# Patient Record
Sex: Male | Born: 1965 | Race: Black or African American | Hispanic: No | Marital: Single | Smoking: Former smoker
Health system: Southern US, Community
[De-identification: ages and names within clinical notes are randomized; demographics above are authoritative.]

## PROBLEM LIST (undated history)

## (undated) DIAGNOSIS — I10 Essential (primary) hypertension: Secondary | ICD-10-CM

## (undated) DIAGNOSIS — E119 Type 2 diabetes mellitus without complications: Secondary | ICD-10-CM

## (undated) DIAGNOSIS — N209 Urinary calculus, unspecified: Secondary | ICD-10-CM

## (undated) DIAGNOSIS — K219 Gastro-esophageal reflux disease without esophagitis: Secondary | ICD-10-CM

## (undated) DIAGNOSIS — M129 Arthropathy, unspecified: Secondary | ICD-10-CM

## (undated) DIAGNOSIS — L4050 Arthropathic psoriasis, unspecified: Secondary | ICD-10-CM

## (undated) DIAGNOSIS — M25511 Pain in right shoulder: Secondary | ICD-10-CM

## (undated) DIAGNOSIS — I2510 Atherosclerotic heart disease of native coronary artery without angina pectoris: Secondary | ICD-10-CM

## (undated) DIAGNOSIS — N200 Calculus of kidney: Secondary | ICD-10-CM

## (undated) DIAGNOSIS — S82309A Unspecified fracture of lower end of unspecified tibia, initial encounter for closed fracture: Secondary | ICD-10-CM

## (undated) DIAGNOSIS — S82839A Other fracture of upper and lower end of unspecified fibula, initial encounter for closed fracture: Secondary | ICD-10-CM

## (undated) HISTORY — DX: Pain in right shoulder: M25.511

## (undated) HISTORY — DX: Arthropathic psoriasis, unspecified: L40.50

## (undated) HISTORY — DX: Essential (primary) hypertension: I10

## (undated) HISTORY — DX: Atherosclerotic heart disease of native coronary artery without angina pectoris: I25.10

## (undated) HISTORY — DX: Urinary calculus, unspecified: N20.9

## (undated) HISTORY — DX: Unspecified fracture of lower end of unspecified tibia, initial encounter for closed fracture: S82.309A

## (undated) HISTORY — DX: Gastro-esophageal reflux disease without esophagitis: K21.9

## (undated) HISTORY — DX: Arthropathy, unspecified: M12.9

## (undated) HISTORY — DX: Other fracture of upper and lower end of unspecified fibula, initial encounter for closed fracture: S82.839A

## (undated) HISTORY — PX: HX APPENDECTOMY: SHX54

---
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
## 1988-11-16 HISTORY — PX: HX CARPAL TUNNEL RELEASE: SHX101

## 1996-01-03 ENCOUNTER — Ambulatory Visit (HOSPITAL_COMMUNITY): Payer: Self-pay

## 1998-05-26 ENCOUNTER — Emergency Department (HOSPITAL_COMMUNITY): Admission: EM | Admit: 1998-05-26 | Discharge: 1998-05-26 | Payer: Self-pay | Admitting: Internal Medicine

## 2002-04-14 ENCOUNTER — Encounter: Payer: Self-pay | Admitting: Emergency Medicine

## 2002-04-14 ENCOUNTER — Emergency Department (HOSPITAL_COMMUNITY): Admission: EM | Admit: 2002-04-14 | Discharge: 2002-04-14 | Payer: Self-pay | Admitting: Emergency Medicine

## 2002-04-18 ENCOUNTER — Ambulatory Visit (INDEPENDENT_AMBULATORY_CARE_PROVIDER_SITE_OTHER): Payer: Self-pay | Admitting: RHEUMATOLOGY

## 2002-11-16 HISTORY — PX: LITHOTRIPSY: GU4

## 2003-04-27 ENCOUNTER — Ambulatory Visit (INDEPENDENT_AMBULATORY_CARE_PROVIDER_SITE_OTHER): Payer: Self-pay | Admitting: RHEUMATOLOGY

## 2003-06-18 ENCOUNTER — Ambulatory Visit (INDEPENDENT_AMBULATORY_CARE_PROVIDER_SITE_OTHER): Payer: Self-pay | Admitting: RHEUMATOLOGY

## 2003-08-10 ENCOUNTER — Ambulatory Visit (INDEPENDENT_AMBULATORY_CARE_PROVIDER_SITE_OTHER): Payer: Self-pay | Admitting: RHEUMATOLOGY

## 2003-11-06 ENCOUNTER — Ambulatory Visit (INDEPENDENT_AMBULATORY_CARE_PROVIDER_SITE_OTHER): Payer: Self-pay | Admitting: RHEUMATOLOGY

## 2004-02-11 ENCOUNTER — Ambulatory Visit (INDEPENDENT_AMBULATORY_CARE_PROVIDER_SITE_OTHER): Payer: Self-pay | Admitting: RHEUMATOLOGY

## 2004-03-03 ENCOUNTER — Ambulatory Visit (INDEPENDENT_AMBULATORY_CARE_PROVIDER_SITE_OTHER): Payer: Self-pay | Admitting: RHEUMATOLOGY

## 2004-06-27 ENCOUNTER — Ambulatory Visit (INDEPENDENT_AMBULATORY_CARE_PROVIDER_SITE_OTHER): Payer: Self-pay | Admitting: RHEUMATOLOGY

## 2004-10-31 ENCOUNTER — Ambulatory Visit (INDEPENDENT_AMBULATORY_CARE_PROVIDER_SITE_OTHER): Payer: Self-pay | Admitting: RHEUMATOLOGY

## 2004-12-29 ENCOUNTER — Ambulatory Visit (HOSPITAL_COMMUNITY): Admission: RE | Admit: 2004-12-29 | Discharge: 2004-12-29 | Payer: Self-pay | Admitting: Family Medicine

## 2005-01-30 ENCOUNTER — Ambulatory Visit (INDEPENDENT_AMBULATORY_CARE_PROVIDER_SITE_OTHER): Payer: Self-pay | Admitting: RHEUMATOLOGY

## 2005-04-30 ENCOUNTER — Ambulatory Visit (INDEPENDENT_AMBULATORY_CARE_PROVIDER_SITE_OTHER): Payer: Self-pay | Admitting: RHEUMATOLOGY

## 2005-07-21 ENCOUNTER — Ambulatory Visit (HOSPITAL_COMMUNITY): Admission: RE | Admit: 2005-07-21 | Discharge: 2005-07-21 | Payer: Self-pay | Admitting: Family Medicine

## 2005-11-05 ENCOUNTER — Ambulatory Visit (INDEPENDENT_AMBULATORY_CARE_PROVIDER_SITE_OTHER): Payer: Self-pay | Admitting: RHEUMATOLOGY

## 2006-01-14 ENCOUNTER — Ambulatory Visit (HOSPITAL_COMMUNITY): Payer: Self-pay | Admitting: EXTERNAL

## 2006-03-20 ENCOUNTER — Emergency Department (HOSPITAL_COMMUNITY): Admission: EM | Admit: 2006-03-20 | Discharge: 2006-03-21 | Payer: Self-pay | Admitting: Emergency Medicine

## 2006-06-02 ENCOUNTER — Ambulatory Visit (INDEPENDENT_AMBULATORY_CARE_PROVIDER_SITE_OTHER): Payer: Self-pay | Admitting: RHEUMATOLOGY

## 2006-10-29 ENCOUNTER — Ambulatory Visit (INDEPENDENT_AMBULATORY_CARE_PROVIDER_SITE_OTHER): Payer: Self-pay | Admitting: RHEUMATOLOGY

## 2006-11-10 ENCOUNTER — Ambulatory Visit (HOSPITAL_COMMUNITY): Admission: RE | Admit: 2006-11-10 | Discharge: 2006-11-10 | Payer: Self-pay | Admitting: Family Medicine

## 2007-02-02 ENCOUNTER — Encounter (INDEPENDENT_AMBULATORY_CARE_PROVIDER_SITE_OTHER): Payer: Self-pay | Admitting: RHEUMATOLOGY

## 2007-04-19 ENCOUNTER — Encounter (INDEPENDENT_AMBULATORY_CARE_PROVIDER_SITE_OTHER): Payer: No Typology Code available for payment source | Admitting: RHEUMATOLOGY

## 2007-04-19 ENCOUNTER — Ambulatory Visit
Admission: RE | Admit: 2007-04-19 | Discharge: 2007-04-19 | Disposition: A | Discharge status: Home / Self Care | Payer: No Typology Code available for payment source | Attending: RHEUMATOLOGY | Admitting: RHEUMATOLOGY

## 2007-04-19 DIAGNOSIS — M2550 Pain in unspecified joint: Secondary | ICD-10-CM | POA: Insufficient documentation

## 2007-08-04 ENCOUNTER — Encounter (INDEPENDENT_AMBULATORY_CARE_PROVIDER_SITE_OTHER): Payer: No Typology Code available for payment source | Admitting: RHEUMATOLOGY

## 2007-10-03 ENCOUNTER — Encounter (HOSPITAL_COMMUNITY): Payer: No Typology Code available for payment source | Admitting: Orthopaedics-Adult Reconstruction

## 2007-10-03 ENCOUNTER — Emergency Department (EMERGENCY_DEPARTMENT_HOSPITAL): Payer: No Typology Code available for payment source

## 2007-10-03 ENCOUNTER — Observation Stay
Admission: EM | Admit: 2007-10-03 | Discharge: 2007-10-04 | Disposition: A | Discharge status: Home / Self Care | Payer: No Typology Code available for payment source | Source: Emergency Department | Attending: Orthopaedics-Adult Reconstruction | Admitting: Orthopaedics-Adult Reconstruction

## 2007-10-03 DIAGNOSIS — L4050 Arthropathic psoriasis, unspecified: Secondary | ICD-10-CM | POA: Insufficient documentation

## 2007-10-03 DIAGNOSIS — IMO0002 Reserved for concepts with insufficient information to code with codable children: Secondary | ICD-10-CM | POA: Insufficient documentation

## 2007-10-03 DIAGNOSIS — S82899A Other fracture of unspecified lower leg, initial encounter for closed fracture: Principal | ICD-10-CM | POA: Insufficient documentation

## 2007-10-03 NOTE — ED Attending Handoff Note (Signed)
Care accepted from Dr. Bevely Palmer.  Mid-shaft tibia fracture, closed, non-dispaced.  Ortho consulted for definitive recommendations and disposition.  Pain controlled.

## 2007-10-03 NOTE — ED Provider Notes (Signed)
Leg Injury   The history is provided by the patient. The incident occurred 3 - 5 hours ago. The incident occurred at work. The injury mechanism was a direct blow (A piece of steel about 250lbs fell on leg). The pain is present in the left leg and left ankle. The quality of the pain is aching and throbbing. The pain is at a severity of 8/10. The pain has been constant since onset. Associated Symptoms: difficulty moving ankle due to pain. Exacerbated by: has not tried bearing weight. He has tried nothing for the symptoms. The treatment(s) provided moderate relief.           Review of Systems   Constitutional: Negative for fever, chills and diaphoresis.   Skin: Negative for rash.   HENT: Negative for headaches.    Cardiovascular: Negative for chest pain.   Respiratory: Negative for cough.  Is not experiencing shortness of breath.   Gastrointestinal: Negative for nausea, vomiting, diarrhea and constipation.   Genitourinary: Negative for dysuria.           Past History:    PMH: Psoriatic arthritis  HTN    PSH:    FamHx:    SocHx: Occasional smoker  Occasional ETOH  No drugs            Physical Exam   Constitutional: He is oriented. He appears well-developed and well-nourished.   HENT:   Head: Normocephalic and atraumatic.   Eyes: Extraocular motions are normal. Pupils are equal, round, and reactive to light.   Neck: Normal range of motion. Neck supple.   Cardiovascular: Normal rate, regular rhythm and normal heart sounds.    Pulmonary/Chest: Effort normal and breath sounds normal.   Abdominal: Bowel sounds are normal.   Musculoskeletal:        Pain in LLE midway down shin and distally.  NV intact.   Neurological: He is alert and oriented.   Skin: Skin is warm and dry.           ED Course:    Results:  XR L knee:   Degenerative changes, otherwise negative left knee.    XR tib-fib: Two views of the left tibia and fibula were obtained on October 03, 2007, because of pain.   There is a fracture through the distal portion of the tibia seen on both  views.  The fracture fragments are in good position and alignment.   XR ankle left: Acute fracture of distal tibial diaphysis.         ED Course:  Patient was seen and evaluated by me.  On exam, patient was stable.  A thorough history and physical exam was performed.  XR of L LE showed tibial fracture.  Ortho consulted and admitted to observe for compartment syndrome.    At time of admission, patient was vitally stable.  Patient expressed understanding and agreed with plan.     Evaluation Plan:  1. XR  2. Ortho consult

## 2007-10-03 NOTE — ED Attending Note (Signed)
Note begun by:  Ree Kida Sonna Lipsky 10/03/2007, 12:43 PM    I was physically present and directly supervised this patient's care.  Patient seen and examined.  Resident history and exam reviewed.  Key elements in addition to and/or correction of that documentation are as follows:    HPI:    41 y.o. male with large steel beam fell about 5 ft onto LLE. Pain and swelling in left leg, unable to bear weight. Denies any other injury. No LOC. No bleeding.    ROS: All other systems reviewed and are negative.    PE:   VS on presentation: Blood pressure 166/110, pulse 80, temperature 36.7 C (98.1 F), resp. rate 18, height 1.778 m (5\' 10" ), weight 99.791 kg (220 lb), SpO2 98%.  This patient was an alert, oriented male in NAD.  Affect is normal. HEENT was normal.  Neck was nontender.  Lungs were clear.  CV: regular rate and rhythm without murmur.  Abdomen was soft, nontender, with no guarding or rebound. There is no CVA tenderness. Extremities had edema and tenderness of left leg diffusely from proximal tibia to ankle. Skin is warm and dry. Neurologically intact. Pulses intact.      Data/Test:    EKG:   Images Review by me: LLE: mid-tibia fx, non-displaced.  Labs:     Clinical Impression:     Left tibia fracture    MDM:       ED Course:      Morphine.  Ortho consult.  Care transferred to Dr. Alla German at shift change with disposition pending per ortho evaluation.  Plan:       Dispo:       CRITICAL CARE: None

## 2007-10-04 ENCOUNTER — Observation Stay (HOSPITAL_BASED_OUTPATIENT_CLINIC_OR_DEPARTMENT_OTHER): Payer: No Typology Code available for payment source | Admitting: Radiology

## 2007-10-11 ENCOUNTER — Encounter (INDEPENDENT_AMBULATORY_CARE_PROVIDER_SITE_OTHER): Payer: No Typology Code available for payment source

## 2007-10-11 ENCOUNTER — Other Ambulatory Visit (INDEPENDENT_AMBULATORY_CARE_PROVIDER_SITE_OTHER): Payer: Self-pay | Admitting: Orthopaedic Surgery of the Spine

## 2007-10-11 ENCOUNTER — Ambulatory Visit
Admission: RE | Admit: 2007-10-11 | Discharge: 2007-10-11 | Disposition: A | Discharge status: Home / Self Care | Payer: No Typology Code available for payment source | Attending: Orthopaedic Surgery of the Spine | Admitting: Orthopaedic Surgery of the Spine

## 2007-10-20 ENCOUNTER — Encounter (INDEPENDENT_AMBULATORY_CARE_PROVIDER_SITE_OTHER): Payer: No Typology Code available for payment source | Admitting: Orthopaedics-Adult Reconstruction

## 2007-10-20 ENCOUNTER — Other Ambulatory Visit (INDEPENDENT_AMBULATORY_CARE_PROVIDER_SITE_OTHER): Payer: Self-pay | Admitting: Orthopaedics-Adult Reconstruction

## 2007-10-20 ENCOUNTER — Ambulatory Visit
Admission: RE | Admit: 2007-10-20 | Discharge: 2007-10-20 | Disposition: A | Discharge status: Home / Self Care | Payer: No Typology Code available for payment source | Attending: Orthopaedics-Adult Reconstruction | Admitting: Orthopaedics-Adult Reconstruction

## 2007-10-20 ENCOUNTER — Ambulatory Visit (INDEPENDENT_AMBULATORY_CARE_PROVIDER_SITE_OTHER): Payer: No Typology Code available for payment source

## 2007-10-20 DIAGNOSIS — S8290XD Unspecified fracture of unspecified lower leg, subsequent encounter for closed fracture with routine healing: Secondary | ICD-10-CM | POA: Insufficient documentation

## 2007-10-25 ENCOUNTER — Encounter (INDEPENDENT_AMBULATORY_CARE_PROVIDER_SITE_OTHER): Payer: No Typology Code available for payment source | Admitting: Orthopaedics-Adult Reconstruction

## 2007-10-28 ENCOUNTER — Encounter (INDEPENDENT_AMBULATORY_CARE_PROVIDER_SITE_OTHER): Payer: Self-pay

## 2007-10-31 ENCOUNTER — Encounter (INDEPENDENT_AMBULATORY_CARE_PROVIDER_SITE_OTHER): Payer: No Typology Code available for payment source

## 2007-11-03 ENCOUNTER — Encounter (INDEPENDENT_AMBULATORY_CARE_PROVIDER_SITE_OTHER): Payer: No Typology Code available for payment source | Admitting: Orthopaedics-Adult Reconstruction

## 2007-11-03 ENCOUNTER — Ambulatory Visit
Admission: RE | Admit: 2007-11-03 | Discharge: 2007-11-03 | Disposition: A | Discharge status: Home / Self Care | Payer: 59 | Attending: Family Medicine | Admitting: Family Medicine

## 2007-11-03 ENCOUNTER — Encounter (INDEPENDENT_AMBULATORY_CARE_PROVIDER_SITE_OTHER): Payer: 59 | Admitting: RHEUMATOLOGY

## 2007-11-03 DIAGNOSIS — M2550 Pain in unspecified joint: Secondary | ICD-10-CM | POA: Insufficient documentation

## 2007-11-03 LAB — CBC/DIFF
BASOPHILS: 1 % (ref 0–1)
BASOS ABS: 0.061 THOU/uL (ref 0.0–0.2)
EOS ABS: 0.291 THOU/uL (ref 0.1–0.3)
EOSINOPHIL: 4 % (ref 1–6)
HCT: 44.6 % (ref 39.8–50.2)
HGB: 15.7 g/dL (ref 13.1–17.3)
LYMPHOCYTES: 36 % (ref 20–45)
LYMPHS ABS: 2.39 THOU/uL (ref 1.0–4.8)
MCH: 29.8 pg (ref 27.4–33.0)
MCHC: 35.1 g/dL (ref 31.6–35.5)
MCV: 84.8 fL (ref 78–100)
MONOCYTES: 8 % (ref 4–13)
MONOS ABS: 0.526 THOU/uL (ref 0.1–0.9)
MPV: 6.8 FL — ABNORMAL LOW (ref 7.4–10.4)
PLATELET COUNT: 279 THO/UL (ref 140–450)
PMN ABS: 3.38 THOU/uL (ref 1.3–7.7)
PMN'S: 51 % (ref 40–75)
RBC: 5.26 MIL/uL (ref 4.46–5.70)
RDW: 11.9 % (ref 11.5–14.5)
WBC: 6.7 10*3/uL (ref 3.5–11.0)

## 2007-11-03 LAB — CREATININE WITH EGFR: ESTIMATED GLOMERULAR FILTRATION RATE: >59 ml/min/1.73m2 (ref >59)

## 2007-11-03 LAB — CREATININE: CREATININE: 1.02 mg/dL (ref 0.62–1.27)

## 2007-11-03 LAB — ALBUMIN: ALBUMIN: 4.2 g/dL (ref 3.5–4.8)

## 2007-11-03 LAB — AST (SGOT): AST (SGOT): 29 U/L (ref 10–40)

## 2007-11-08 LAB — DRUG SCREEN, HIGH OPIATE CUTOFF, WITH CONFIRMATION, URINE
AMPHETAMINE, URINE: NEGATIVE
BARBITURATE, URINE: NEGATIVE
BENZODIAZEPINE,URINE: NEGATIVE
CANNABINOID (THC), URINE: NEGATIVE
COCAINE METAB. URINE: NEGATIVE
OPIATE, URINE: POSITIVE — AB
PHENCYCLIDINE, URINE: NEGATIVE

## 2007-11-14 ENCOUNTER — Ambulatory Visit (INDEPENDENT_AMBULATORY_CARE_PROVIDER_SITE_OTHER): Payer: No Typology Code available for payment source

## 2007-11-14 ENCOUNTER — Ambulatory Visit (INDEPENDENT_AMBULATORY_CARE_PROVIDER_SITE_OTHER): Payer: No Typology Code available for payment source | Admitting: ORTHOPEDIC, SPORTS MEDICINE

## 2007-11-14 ENCOUNTER — Other Ambulatory Visit (INDEPENDENT_AMBULATORY_CARE_PROVIDER_SITE_OTHER): Payer: Self-pay | Admitting: ORTHOPEDIC, SPORTS MEDICINE

## 2007-11-17 HISTORY — PX: PB LAP UMBILICAL HERNIA REPAIR: S2076

## 2007-12-08 ENCOUNTER — Ambulatory Visit (INDEPENDENT_AMBULATORY_CARE_PROVIDER_SITE_OTHER): Payer: No Typology Code available for payment source

## 2007-12-08 ENCOUNTER — Encounter (INDEPENDENT_AMBULATORY_CARE_PROVIDER_SITE_OTHER): Payer: No Typology Code available for payment source | Admitting: Orthopaedics-Adult Reconstruction

## 2007-12-08 ENCOUNTER — Other Ambulatory Visit (INDEPENDENT_AMBULATORY_CARE_PROVIDER_SITE_OTHER): Payer: Self-pay | Admitting: Orthopaedics-Adult Reconstruction

## 2007-12-12 NOTE — Progress Notes (Signed)
[facility] Department of Orthopaedics  [address]  [place], [place] [address]      SPORTS MEDICINE PROGRESS NOTE    PATIENT NAME: Chase [name], Chase [name]  CHART NUMBER: [id number]  DATE OF BIRTH: Feb 18, 1966  DATE OF SERVICE: 12/08/2007    CLAIM NUMBER: [id number]    SUBJECTIVE: Mr. Joran Kallal returns now about two months since sustaining a tibia fracture at work. He has been in a boot walker. He states he is having a little bit of pain with activities but it is lessening. He is now able to put all his weight on his leg when wearing the boot walker.    OBJECTIVE: On physical examination, he walks with an antalgic gait favoring his left lower extremity. He has some slight discomfort to palpation at the fracture site and has abundant callus formation.    RADIOGRAPHS: Radiographs were taken today, two views show that the fracture line persists to the tibia but he does show some early callus and it is healing well.    ASSESSMENT: Healing left tibia fracture.    PLAN: Discussed in detail with the patient that overall I think he is doing well. He is to continue with the boot walker and be weightbearing as tolerated. I think at this point is too early for him to return to work and therefore, will continue with him with no work activities. We will reassess this again in one month's time.      Alen Bleacher, MD  Assistant Professor  [place] Department of Orthopaedics    BM/[id number]; D: 12/08/2007 12:34:31; T: 12/10/2007 18:18:55    cc: [facility]    [address]    [place], [place] [address]

## 2007-12-15 ENCOUNTER — Encounter (INDEPENDENT_AMBULATORY_CARE_PROVIDER_SITE_OTHER): Payer: No Typology Code available for payment source | Admitting: ORTHOPEDIC, SPORTS MEDICINE

## 2007-12-20 ENCOUNTER — Ambulatory Visit (HOSPITAL_COMMUNITY): Admission: RE | Admit: 2007-12-20 | Discharge: 2007-12-20 | Payer: Self-pay | Admitting: Family Medicine

## 2008-01-05 ENCOUNTER — Ambulatory Visit (INDEPENDENT_AMBULATORY_CARE_PROVIDER_SITE_OTHER): Payer: No Typology Code available for payment source

## 2008-01-05 ENCOUNTER — Encounter (INDEPENDENT_AMBULATORY_CARE_PROVIDER_SITE_OTHER): Payer: No Typology Code available for payment source | Admitting: Orthopaedics-Adult Reconstruction

## 2008-01-05 ENCOUNTER — Other Ambulatory Visit (INDEPENDENT_AMBULATORY_CARE_PROVIDER_SITE_OTHER): Payer: Self-pay | Admitting: Orthopaedics-Adult Reconstruction

## 2008-01-08 NOTE — Progress Notes (Signed)
[facility] Department of Orthopaedics  [address]  [place], [place] [address]      SPORTS MEDICINE PROGRESS NOTE    PATIENT NAME: Chase [name]  CHART NUMBER: [id number]  DATE OF BIRTH: May 12, 1966  DATE OF SERVICE: 01/05/2008    DOI: 10/03/2007  CLAIM#: [id number]    CHIEF COMPLAINT: Left distal tibia fracture.    SUBJECTIVE: Chase [name] was seen in Orthopaedic Clinic today. He is a 42 year old male who sustained a left distal tibia fracture at work. His date of injury was 10/03/2007. He is three months out. Today in clinic, he essentially has no complaints. He has been weightbearing as tolerated on his left lower extremity. He does not require any pain medications. He would like to return to work.    OBJECTIVE: On physical examination, he is well appearing, in no acute distress. Mood and affect are appropriate. Gross examination of his left distal tibia reveals palpable abundant callus. He is grossly neurovascularly intact distally. He is able to walk with just slight limp.    STUDIES: X-rays obtained in clinic today of his left ankle reveal his distal tibia fracture. There is evidence of healing posterior medially. There is still persistence of fracture line.    ASSESSMENT/PLAN: At this point, extensive discussion was carried out with Chase [name] and his wife. We will allow him to return to work with reevaluation in three months. He was told that if he were to have any problems in the meantime, that we would see him sooner. Otherwise, we are hoping that this heals with a little more weightbearing stimulation. Otherwise, we will have to assess him for nonunion with a CT scan. He is in agreement. We will see him back in three months.      Olen Pel, MD  Resident  [place] Department of Orthopaedics    Alen Bleacher, MD  Assistant Professor  [facility] Department of Orthopaedics    [id number]; D: 01/05/2008 09:38:37; T: 01/06/2008 16:13:48    cc: [facility]    [address]    [place], [place] [address]

## 2008-02-02 ENCOUNTER — Ambulatory Visit (INDEPENDENT_AMBULATORY_CARE_PROVIDER_SITE_OTHER): Payer: No Typology Code available for payment source

## 2008-02-02 ENCOUNTER — Encounter (INDEPENDENT_AMBULATORY_CARE_PROVIDER_SITE_OTHER): Payer: No Typology Code available for payment source | Admitting: Orthopaedics-Adult Reconstruction

## 2008-02-02 ENCOUNTER — Ambulatory Visit (INDEPENDENT_AMBULATORY_CARE_PROVIDER_SITE_OTHER): Payer: 59 | Admitting: RHEUMATOLOGY

## 2008-02-02 ENCOUNTER — Other Ambulatory Visit (INDEPENDENT_AMBULATORY_CARE_PROVIDER_SITE_OTHER): Payer: Self-pay | Admitting: Orthopaedics-Adult Reconstruction

## 2008-02-02 ENCOUNTER — Ambulatory Visit
Admission: RE | Admit: 2008-02-02 | Discharge: 2008-02-02 | Disposition: A | Discharge status: Home / Self Care | Payer: No Typology Code available for payment source | Attending: RHEUMATOLOGY | Admitting: RHEUMATOLOGY

## 2008-02-02 DIAGNOSIS — M2550 Pain in unspecified joint: Secondary | ICD-10-CM | POA: Insufficient documentation

## 2008-02-02 DIAGNOSIS — Z5181 Encounter for therapeutic drug level monitoring: Secondary | ICD-10-CM | POA: Insufficient documentation

## 2008-02-02 LAB — CBC/DIFF
BASOPHILS: 1 % (ref 0–1)
BASOS ABS: 0.068 THOU/uL (ref 0.0–0.2)
EOS ABS: 0.205 THOU/uL (ref 0.1–0.3)
EOSINOPHIL: 4 % (ref 1–6)
HCT: 47 % (ref 39.8–50.2)
HGB: 16.2 g/dL (ref 13.1–17.3)
LYMPHOCYTES: 43 % (ref 20–45)
LYMPHS ABS: 2.57 THOU/uL (ref 1.0–4.8)
MCH: 29.3 pg (ref 27.4–33.0)
MCHC: 34.5 g/dL (ref 31.6–35.5)
MCV: 84.7 fL (ref 78–100)
MONOCYTES: 9 % (ref 4–13)
MONOS ABS: 0.506 THOU/uL (ref 0.1–0.9)
MPV: 7 FL — ABNORMAL LOW (ref 7.4–10.4)
PLATELET COUNT: 300 THO/UL (ref 140–450)
PMN ABS: 2.49 THOU/uL (ref 1.3–7.7)
PMN'S: 43 % (ref 40–75)
RBC: 5.55 MIL/uL (ref 4.46–5.70)
RDW: 12.5 % (ref 11.5–14.5)
WBC: 5.8 THOU/UL (ref 3.5–11.0)

## 2008-02-02 LAB — ALT (SGPT): ALT (SGPT): 50 U/L — ABNORMAL HIGH (ref 10–46)

## 2008-02-02 LAB — AST (SGOT): AST (SGOT): 26 U/L (ref 10–40)

## 2008-02-02 LAB — CREATININE WITH EGFR: ESTIMATED GLOMERULAR FILTRATION RATE: >59 ml/min/1.73m2 (ref >59)

## 2008-02-02 MED ORDER — NAPROXEN 250 MG TABLET
250.00 mg | ORAL_TABLET | Freq: Two times a day (BID) | ORAL | Status: DC
Start: 2008-02-02 — End: 2009-03-19

## 2008-03-12 ENCOUNTER — Ambulatory Visit (INDEPENDENT_AMBULATORY_CARE_PROVIDER_SITE_OTHER): Payer: No Typology Code available for payment source

## 2008-03-12 ENCOUNTER — Other Ambulatory Visit (INDEPENDENT_AMBULATORY_CARE_PROVIDER_SITE_OTHER): Payer: Self-pay | Admitting: Emergency Medicine

## 2008-03-29 ENCOUNTER — Ambulatory Visit (INDEPENDENT_AMBULATORY_CARE_PROVIDER_SITE_OTHER): Payer: No Typology Code available for payment source

## 2008-03-29 ENCOUNTER — Encounter (INDEPENDENT_AMBULATORY_CARE_PROVIDER_SITE_OTHER): Payer: No Typology Code available for payment source | Admitting: Orthopaedics-Adult Reconstruction

## 2008-03-29 ENCOUNTER — Other Ambulatory Visit (INDEPENDENT_AMBULATORY_CARE_PROVIDER_SITE_OTHER): Payer: Self-pay | Admitting: Orthopaedics-Adult Reconstruction

## 2008-05-08 ENCOUNTER — Encounter (INDEPENDENT_AMBULATORY_CARE_PROVIDER_SITE_OTHER): Payer: 59 | Admitting: RHEUMATOLOGY

## 2008-05-09 NOTE — Progress Notes (Signed)
[facility] Department of Orthopaedics  [address]  [place], [place] [address]      SPORTS MEDICINE PROGRESS NOTE    PATIENT NAME: Chase [name], Chase [name]  CHART NUMBER: [id number]  DATE OF BIRTH: [birth year]  DATE OF SERVICE: 03/29/2008    CLAIM NO: [id number]    SUBJECTIVE: The patient returns now for further followup several months since sustaining a left distal tibia fracture. This was treated nonoperatively initially with casting and then brace. Current presently not using a brace. He is not using walking aids. He is able to walk unlimited distances but does have some pain and swelling at times.    OBJECTIVE: On physical exam, he really is nontender to palpation throughout the left lower extremity. He does have some abundant callus as expected.    RADIOGRAPHS: Radiographs show the fracture is healed. He does have some persistent fracture line but really abundant callus otherwise and nicely healed well aligned tibia fracture.    ASSESSMENT: Healed tibia fracture.    PLAN: Discussed in detail with the patient that I think he is doing quite well. We will allow him to increase activities as tolerated and get back to all normal activities. No restrictions. Follow up with [place] on a p.r.n. basis. All questions answered.      Alen Bleacher, MD  Assistant Professor  [place] Department of Orthopaedics    [id number]; D: 05/03/2008 14:28:12; T: 05/07/2008 06:43:49

## 2008-05-09 NOTE — Progress Notes (Signed)
[facility] Department of Orthopaedics  [address]  [place], [place] [address]      SPORTS MEDICINE PROGRESS NOTE    PATIENT NAME: Chase [name], Chase [name]  CHART NUMBER: [id number]  DATE OF BIRTH: Oct 18, 1966  DATE OF SERVICE: 03/29/2008    CLAIM NUMBER: [id number].  DATE OF INJURY: 10/03/2007.    SUBJECTIVE: Chase [name] comes in today here with a history of a left distal tibia fracture that we have been following for nonoperative treatment. We saw the patient last time. He should improve callus. He comes in today continuing to walk on it. The patient says he has occasional pains and is here for routine followup.    OBJECTIVE: Seated in the room in no significant distress. The leg, itself, is clean, dry and intact without any signs of neurovascular compromise. There is no Homans sign. He is able to move his ankle up and down. He is able to dorsiflex past neutral and plantar flex 25 degrees. He has mildly stiff subtalar motion. His knee has good range of motion.    Imaging: X-rays were taken today, which showed consolidation of the fracture with fracture sign with three bridging cortices. No other fractures or dislocations appreciated and no interval change, otherwise.    ASSESSMENT: Closed treatment of left distal tibia fracture. At this point, healing well.    PLAN: Our plan with Chase [name] is to have him continue his activity level. At this point, we will let him return to work with no limitations. We will see him back on a p.r.n. basis. In the interim, if he has any questions or concerns, he may contact us, and we wish him well.      Jessie Foot, MD  Resident  [facility] Department of Orthopaedics    Alen Bleacher, MD  Assistant Professor  [facility] Department of Orthopaedics    [id number]; D: 05/07/2008 10:38:40; T: 05/09/2008 04:36:27    cc: [facility]    [address]    [place], [place] [address]      Chase Hy DO   [address]   [place], [place] [address]

## 2008-07-12 ENCOUNTER — Encounter (INDEPENDENT_AMBULATORY_CARE_PROVIDER_SITE_OTHER): Payer: 59 | Admitting: RHEUMATOLOGY

## 2008-08-02 ENCOUNTER — Encounter (INDEPENDENT_AMBULATORY_CARE_PROVIDER_SITE_OTHER): Payer: 59 | Admitting: RHEUMATOLOGY

## 2008-08-30 ENCOUNTER — Ambulatory Visit
Admission: RE | Admit: 2008-08-30 | Discharge: 2008-08-30 | Disposition: A | Discharge status: Home / Self Care | Payer: 59 | Attending: RHEUMATOLOGY | Admitting: RHEUMATOLOGY

## 2008-08-30 ENCOUNTER — Encounter (INDEPENDENT_AMBULATORY_CARE_PROVIDER_SITE_OTHER): Payer: 59 | Admitting: RHEUMATOLOGY

## 2008-08-30 DIAGNOSIS — M2550 Pain in unspecified joint: Secondary | ICD-10-CM | POA: Insufficient documentation

## 2008-08-30 LAB — CBC/DIFF
BASOPHILS: 1 % (ref 0–1)
BASOS ABS: 0.048 THOU/uL (ref 0.0–0.2)
EOS ABS: 0.119 10*3/uL (ref 0.1–0.3)
EOSINOPHIL: 1 % (ref 1–6)
HCT: 48.3 % (ref 39.8–50.2)
HGB: 16.7 g/dL (ref 13.1–17.3)
LYMPHOCYTES: 23 % (ref 20–45)
LYMPHS ABS: 2.03 THOU/uL (ref 1.0–4.8)
MCH: 29.1 pg (ref 27.4–33.0)
MCHC: 34.5 g/dL (ref 31.6–35.5)
MCV: 84.2 fL (ref 82.0–99.0)
MONOCYTES: 6 % (ref 4–13)
MONOS ABS: 0.555 THOU/uL (ref 0.1–0.9)
MPV: 7 FL — ABNORMAL LOW (ref 7.4–10.4)
PLATELET COUNT: 315 THO/UL (ref 140–450)
PMN ABS: 6 THOU/uL (ref 1.5–7.7)
PMN'S: 69 % (ref 40–75)
RBC: 5.73 MIL/uL — ABNORMAL HIGH (ref 4.46–5.70)
RDW: 11.9 % (ref 10.2–14.0)
WBC: 8.8 THOU/UL (ref 3.5–11.0)

## 2008-08-30 LAB — AST (SGOT): AST (SGOT): 25 U/L (ref 10–40)

## 2008-09-16 HISTORY — PX: HX HEART CATHETERIZATION: SHX148

## 2008-12-17 ENCOUNTER — Ambulatory Visit: Payer: Self-pay | Admitting: Diagnostic Radiology

## 2008-12-17 ENCOUNTER — Emergency Department (HOSPITAL_BASED_OUTPATIENT_CLINIC_OR_DEPARTMENT_OTHER): Admission: EM | Admit: 2008-12-17 | Discharge: 2008-12-17 | Payer: Self-pay | Admitting: Emergency Medicine

## 2008-12-17 ENCOUNTER — Ambulatory Visit
Admission: RE | Admit: 2008-12-17 | Discharge: 2008-12-17 | Disposition: A | Discharge status: Home / Self Care | Payer: 59 | Attending: RHEUMATOLOGY | Admitting: RHEUMATOLOGY

## 2008-12-17 ENCOUNTER — Ambulatory Visit (INDEPENDENT_AMBULATORY_CARE_PROVIDER_SITE_OTHER): Payer: 59 | Admitting: RHEUMATOLOGY

## 2008-12-17 VITALS — BP 110/70 | HR 72 | Temp 96.5°F | Ht 70.0 in | Wt 205.6 lb

## 2008-12-17 DIAGNOSIS — L4050 Arthropathic psoriasis, unspecified: Secondary | ICD-10-CM | POA: Insufficient documentation

## 2008-12-17 LAB — CBC/DIFF
BASOPHILS: 1 % (ref 0–1)
BASOS ABS: 0.067 THOU/uL (ref 0.0–0.2)
EOS ABS: 0.174 THOU/uL (ref 0.1–0.3)
EOSINOPHIL: 2 % (ref 1–6)
HCT: 46.1 % (ref 39.8–50.2)
HGB: 15.8 g/dL (ref 13.1–17.3)
LYMPHOCYTES: 19 % — ABNORMAL LOW (ref 20–45)
LYMPHS ABS: 1.76 THOU/uL (ref 1.0–4.8)
MCH: 30.2 pg (ref 27.4–33.0)
MCHC: 34.2 g/dL (ref 31.6–35.5)
MCV: 88.5 fL (ref 82.0–99.0)
MONOCYTES: 6 % (ref 4–13)
MONOS ABS: 0.576 THOU/uL (ref 0.1–0.9)
MPV: 7.2 FL — ABNORMAL LOW (ref 7.4–10.4)
NRBC'S: 0 /100{WBCs}
PLATELET COUNT: 264 THO/UL (ref 140–450)
PMN ABS: 6.76 THOU/uL (ref 1.5–7.7)
PMN'S: 72 % (ref 40–75)
RBC: 5.21 MIL/uL (ref 4.46–5.70)
RDW: 12.2 % (ref 10.2–14.0)
WBC: 9.3 THOU/UL (ref 3.5–11.0)

## 2008-12-17 LAB — CREATININE WITH EGFR: CREATININE: 0.98 mg/dL (ref 0.62–1.27)

## 2008-12-17 LAB — AST (SGOT): AST (SGOT): 20 U/L (ref 10–40)

## 2008-12-17 NOTE — Progress Notes (Signed)
.  rheum  Subjective:     Patient ID:  Chase [name] is an 43 y.o. male   Chief Complaint   Patient presents with   . F/U     Psoriatic arthritis. Juststarted glucophage  for DM.  Tolerating enbrel  and naprosyn  well.  No AM stiffnewss.    HPI    ROS  Objective:Gait brisk no cane minimal psoriasis. Minimal synovitis vitals ok.   Physical Exam  .  .   Assessment & Plan:     No diagnosis found.

## 2008-12-20 ENCOUNTER — Encounter (INDEPENDENT_AMBULATORY_CARE_PROVIDER_SITE_OTHER): Payer: 59 | Admitting: RHEUMATOLOGY

## 2008-12-26 ENCOUNTER — Ambulatory Visit (HOSPITAL_COMMUNITY): Admission: RE | Admit: 2008-12-26 | Discharge: 2008-12-26 | Payer: Self-pay | Admitting: Family Medicine

## 2008-12-26 ENCOUNTER — Other Ambulatory Visit (INDEPENDENT_AMBULATORY_CARE_PROVIDER_SITE_OTHER): Payer: Self-pay

## 2008-12-26 MED ORDER — METOPROLOL SUCCINATE ER 50 MG TABLET,EXTENDED RELEASE 24 HR
50.0000 mg | ORAL_TABLET | Freq: Every day | ORAL | Status: DC
Start: 2008-12-26 — End: 2010-01-23

## 2008-12-26 NOTE — Telephone Encounter (Signed)
Tried to call [employer] No response.  All circuits busy.  I left message on the patients cell phone letting him know.  Nanda Quinton, MA

## 2008-12-26 NOTE — Telephone Encounter (Signed)
Pt called needing refill on metoprolol.  Nanda Quinton, MA

## 2008-12-26 NOTE — Telephone Encounter (Signed)
I tried [employer] @ 4:34pm still unable to reach.  Recording saying "All circuits busy."  Nanda Quinton, MA

## 2008-12-27 ENCOUNTER — Encounter (INDEPENDENT_AMBULATORY_CARE_PROVIDER_SITE_OTHER): Payer: 59 | Admitting: RHEUMATOLOGY

## 2009-01-04 ENCOUNTER — Telehealth (INDEPENDENT_AMBULATORY_CARE_PROVIDER_SITE_OTHER): Payer: Self-pay

## 2009-01-04 NOTE — Telephone Encounter (Signed)
 Recommend he stay on meds as prescribed

## 2009-01-04 NOTE — Telephone Encounter (Signed)
Pt called, going to [facility] next week and would like to stop Lasix and K+. Pt going to be golfing and does not want to stop every 5 minutes to go to bathroom.   Nanda Quinton, MA

## 2009-01-04 NOTE — Telephone Encounter (Signed)
Informed patient.  Pt voices understanding.  Nanda Quinton, MA

## 2009-01-07 ENCOUNTER — Encounter (INDEPENDENT_AMBULATORY_CARE_PROVIDER_SITE_OTHER): Payer: Self-pay

## 2009-01-07 ENCOUNTER — Other Ambulatory Visit (INDEPENDENT_AMBULATORY_CARE_PROVIDER_SITE_OTHER): Payer: Self-pay

## 2009-01-07 MED ORDER — POTASSIUM CHLORIDE ER 10 MEQ TABLET,EXTENDED RELEASE(PART/CRYST)
10.0000 meq | ORAL_TABLET | Freq: Every day | ORAL | Status: DC
Start: 2009-01-07 — End: 2009-06-25

## 2009-01-10 ENCOUNTER — Encounter (INDEPENDENT_AMBULATORY_CARE_PROVIDER_SITE_OTHER): Payer: Self-pay | Admitting: Family Medicine

## 2009-01-10 ENCOUNTER — Encounter (INDEPENDENT_AMBULATORY_CARE_PROVIDER_SITE_OTHER): Payer: Self-pay | Admitting: Optometrist

## 2009-01-13 ENCOUNTER — Encounter (INDEPENDENT_AMBULATORY_CARE_PROVIDER_SITE_OTHER): Payer: Self-pay | Admitting: Family Medicine

## 2009-01-15 ENCOUNTER — Encounter (INDEPENDENT_AMBULATORY_CARE_PROVIDER_SITE_OTHER): Payer: Self-pay | Admitting: Family Medicine

## 2009-01-22 ENCOUNTER — Encounter (INDEPENDENT_AMBULATORY_CARE_PROVIDER_SITE_OTHER): Payer: Self-pay

## 2009-01-22 ENCOUNTER — Ambulatory Visit (INDEPENDENT_AMBULATORY_CARE_PROVIDER_SITE_OTHER): Payer: 59 | Admitting: Family Medicine

## 2009-01-22 ENCOUNTER — Telehealth (INDEPENDENT_AMBULATORY_CARE_PROVIDER_SITE_OTHER): Payer: Self-pay

## 2009-01-22 VITALS — BP 120/78 | HR 73 | Temp 96.9°F | Resp 18 | Wt 208.0 lb

## 2009-01-22 LAB — POCT HGB A1C: HEMOGLOBIN A1C: 7

## 2009-01-22 MED ORDER — BLOOD SUGAR DIAGNOSTIC STRIPS
1.00 | ORAL_STRIP | Freq: Three times a day (TID) | Status: DC
Start: 2009-01-22 — End: 2014-04-13

## 2009-01-22 MED ORDER — DIABETIC SUPPLIES, MISCELLAN.
1.00 | Status: DC
Start: 2009-01-22 — End: 2014-04-13

## 2009-01-22 MED ORDER — PIOGLITAZONE 30 MG TABLET
30.0000 mg | ORAL_TABLET | Freq: Every day | ORAL | Status: DC
Start: 2009-01-22 — End: 2009-09-27

## 2009-01-22 NOTE — Telephone Encounter (Signed)
Called and notified Delorise Shiner, wife that patient can be seen at 6:00p.m today.  Delorise Shiner states that he will be here.  Staci Acosta, LPN

## 2009-01-22 NOTE — Telephone Encounter (Signed)
Can come today at 6 pm

## 2009-01-22 NOTE — Telephone Encounter (Signed)
Chase [name], wife called stating that patient had to cancel appt last week because he was out of town.  Patient is requesting an appt now because he is not feeling well and states that blood sugar are elevated and staying elevated in the upper 200s.  Complaining of nausea but wife unsure of what else is wrong.  States that he just says he feels weird.  Staci Acosta, LPN

## 2009-01-22 NOTE — Progress Notes (Signed)
Subjective:     Patient ID:  Chase [name] is an 43 y.o. male     Chief Complaint:  Chief Complaint   Patient presents with   . High Blood Sugar         HPI  Blood sugar has been in the mid 200's today and pt. Didn't feel very good. Felt nauseated and weak. Has been taking meds every day and watching diet pretty well.     Review of Systems   Constitutional: Positive for malaise/fatigue and weakness. Negative for fever and chills.   HENT: Negative for headaches.    Cardiovascular: Negative for chest pain, palpitations and leg swelling.   Respiratory: Negative for cough.  Is not experiencing shortness of breath.   Gastrointestinal: Positive for nausea and abdominal pain. Negative for vomiting.   Neurological: Negative for dizziness.       Objective:     Physical Exam   Nursing note and vitals reviewed.  Constitutional: He is oriented and well-developed, well-nourished, and in no distress. He appears not diaphoretic. No distress.   HENT:   Head: Normocephalic and atraumatic.   Eyes: Conjunctivae are normal. Pupils are equal, round, and reactive to light. Right eye exhibits no discharge. Left eye exhibits no discharge.   Neck: Normal range of motion. Neck supple. No JVD present. Carotid bruit is not present. No rigidity. No tracheal deviation present. No thyromegaly present.        No carotid bruits   Cardiovascular: Normal rate, regular rhythm, normal heart sounds and intact distal pulses.  Exam reveals no gallop and no friction rub.    No murmur heard.  Pulmonary/Chest: Breath sounds normal. No respiratory distress. He has no wheezes. He has no rhonchi. He has no rales.   Abdominal: Bowel sounds are normal. He exhibits no distension and no mass. Soft. There is no organomegaly, splenomegaly or hepatomegaly. No tenderness. He has no rebound and no guarding.   Musculoskeletal: He exhibits no edema and no tenderness.   Lymphadenopathy:     He has no cervical adenopathy.    Neurological: He is alert and oriented. Gait normal. Coordination normal.   Skin: Skin is warm and dry. No rash noted. He is not diaphoretic.   Psychiatric: Mood, affect and judgment normal.         Ortho/Musculoskeletal:   He exhibits no edema and no tenderness.       Assessment & Plan:     Encounter Diagnoses   Code Name Primary? Qualifier   . 250.42F Diabetes mellitus type II, uncontrolled Yes     Plan: POCT HGB A1C, BLOOD SUGAR DIAGNOSTIC TEST STRIPS, DIABETIC SUPPLIES, MISCELLAN. MISC   . 401.9AJ Hypertension     . 530.81S GERD (gastroesophageal reflux disease)     . 414.00AE CAD (coronary artery disease)       Office Visit on 01/22/2009   Component Date Value Range Status   . HEMOGLOBIN A1C  01/22/2009 7.0  - Final       Hga1c is better. Check chem 14 to look at liver and kidney function.  Add Actos, did diabetic teaching and gave a BG monitor and strips.  Offered diabetic classes. Pt will check with [facility] to see if any available and let me know. BP well controlled. Con't meds. Close f/u in 6 weeks

## 2009-01-24 ENCOUNTER — Encounter (INDEPENDENT_AMBULATORY_CARE_PROVIDER_SITE_OTHER): Payer: Self-pay

## 2009-02-05 ENCOUNTER — Other Ambulatory Visit (INDEPENDENT_AMBULATORY_CARE_PROVIDER_SITE_OTHER): Payer: Self-pay

## 2009-02-05 MED ORDER — AMLODIPINE 10 MG TABLET
10.0000 mg | ORAL_TABLET | Freq: Every day | ORAL | Status: DC
Start: 2009-02-05 — End: 2009-09-27

## 2009-02-05 MED ORDER — ENALAPRIL MALEATE 20 MG TABLET
20.0000 mg | ORAL_TABLET | Freq: Every day | ORAL | Status: DC
Start: 2009-02-05 — End: 2009-09-27

## 2009-02-05 NOTE — Telephone Encounter (Signed)
Pt wife called and wanted refill for pt norvasc and enalapril? I tried to call wife to make sure that is what pt taking. No answer at 1:20 pm

## 2009-02-05 NOTE — Telephone Encounter (Signed)
Tried to call [employer] @ 4:29pm, no answer.

## 2009-02-05 NOTE — Telephone Encounter (Signed)
Discussed with patient will do.

## 2009-02-26 ENCOUNTER — Encounter (INDEPENDENT_AMBULATORY_CARE_PROVIDER_SITE_OTHER): Payer: 59 | Admitting: Family Medicine

## 2009-03-05 ENCOUNTER — Encounter (INDEPENDENT_AMBULATORY_CARE_PROVIDER_SITE_OTHER): Payer: 59 | Admitting: Family Medicine

## 2009-03-11 ENCOUNTER — Telehealth (INDEPENDENT_AMBULATORY_CARE_PROVIDER_SITE_OTHER): Payer: Self-pay

## 2009-03-11 NOTE — Telephone Encounter (Signed)
Patient called and left message stating that he has been feeling weak and tired.  States that his blood sugars have been running 114 to 170's.  Patient states that he is on a total of nine pills and he doesn't know if his medications will make him feel this way.  States that if he can't get in today then he has an appt on May 5th with his rheumatologist and maybe we could see him that day.  Staci Acosta, LPN

## 2009-03-11 NOTE — Telephone Encounter (Signed)
I believe he has missed or cancelled his last couple appts. Rec. He keep his upcoming appt.

## 2009-03-12 ENCOUNTER — Other Ambulatory Visit (INDEPENDENT_AMBULATORY_CARE_PROVIDER_SITE_OTHER): Payer: Self-pay

## 2009-03-12 NOTE — Telephone Encounter (Signed)
Called and left message for patient to call the office.    Shawntrice Salle Jo Teiara Baria, LPN

## 2009-03-19 ENCOUNTER — Ambulatory Visit (INDEPENDENT_AMBULATORY_CARE_PROVIDER_SITE_OTHER): Payer: 59 | Admitting: Family Medicine

## 2009-03-19 ENCOUNTER — Other Ambulatory Visit (INDEPENDENT_AMBULATORY_CARE_PROVIDER_SITE_OTHER): Payer: Self-pay

## 2009-03-19 ENCOUNTER — Encounter (INDEPENDENT_AMBULATORY_CARE_PROVIDER_SITE_OTHER): Payer: Self-pay | Admitting: RHEUMATOLOGY

## 2009-03-19 ENCOUNTER — Encounter (INDEPENDENT_AMBULATORY_CARE_PROVIDER_SITE_OTHER): Payer: Self-pay | Admitting: Family Medicine

## 2009-03-19 ENCOUNTER — Encounter (INDEPENDENT_AMBULATORY_CARE_PROVIDER_SITE_OTHER): Payer: 59 | Admitting: RHEUMATOLOGY

## 2009-03-19 ENCOUNTER — Ambulatory Visit (INDEPENDENT_AMBULATORY_CARE_PROVIDER_SITE_OTHER): Payer: 59 | Admitting: RHEUMATOLOGY

## 2009-03-19 VITALS — BP 116/74 | HR 70 | Temp 97.9°F | Resp 14 | Ht 70.0 in | Wt 210.4 lb

## 2009-03-19 VITALS — BP 104/62 | HR 86 | Temp 97.9°F | Resp 18 | Wt 211.0 lb

## 2009-03-19 DIAGNOSIS — E119 Type 2 diabetes mellitus without complications: Secondary | ICD-10-CM

## 2009-03-19 DIAGNOSIS — E1100 Type 2 diabetes mellitus with hyperosmolarity without nonketotic hyperglycemic-hyperosmolar coma (NKHHC): Secondary | ICD-10-CM | POA: Insufficient documentation

## 2009-03-19 HISTORY — DX: Type 2 diabetes mellitus without complications: E11.9

## 2009-03-19 LAB — POCT HGB A1C: HEMOGLOBIN A1C: 6.8

## 2009-03-19 MED ORDER — CALCIPOTRIENE 0.005 % TOPICAL OINTMENT
TOPICAL_OINTMENT | Freq: Two times a day (BID) | CUTANEOUS | Status: DC
Start: 2009-03-19 — End: 2014-04-13

## 2009-03-19 MED ORDER — NAPROXEN 250 MG TABLET
500.00 mg | ORAL_TABLET | Freq: Two times a day (BID) | ORAL | Status: DC
Start: 2009-03-19 — End: 2009-03-19

## 2009-03-19 MED ORDER — ETANERCEPT 25 MG (1 ML) SUBCUTANEOUS POWDER FOR SOLUTION
50.00 mg | SUBCUTANEOUS | Status: DC
Start: 2009-03-19 — End: 2012-08-01

## 2009-03-19 MED ORDER — NAPROXEN 250 MG TABLET
250.00 mg | ORAL_TABLET | Freq: Two times a day (BID) | ORAL | Status: DC
Start: 2009-03-19 — End: 2009-03-19

## 2009-03-19 NOTE — Progress Notes (Signed)
Subjective:     Patient ID:  Chase [name] is an 43 y.o. male     Chief Complaint:  Chief Complaint   Patient presents with   . Hypertension   . Esophageal Reflux   . Heart Problem   . Diabetes         HPI has a past medical history of Hypertension, GERD (gastroesophageal reflux disease), Unspecified urinary calculus, Arthritis with psoriasis, Fracture of distal end of tibia, Fracture of distal fibula, CAD (coronary artery disease), and Diabetes mellitus, type 2 (03/19/2009).  A couple of weeks ago felt very tired. Now feels back to normal.. Sugar has been pretty well controlled. Is exercising. No hypoglycemic events.     Review of Systems   Constitutional: Negative for fever, chills and weakness.   Skin: Negative for rash.   HENT: Negative for headaches and sore throat.    Eyes: Negative for blurred vision and double vision.   Cardiovascular: Negative for chest pain, palpitations, orthopnea and leg swelling.   Respiratory: Negative for cough.  Is not experiencing shortness of breath or wheezing.   Gastrointestinal: Negative for nausea, vomiting, abdominal pain and diarrhea.   Genitourinary: Negative for dysuria, urgency and frequency.   Musculoskeletal: Negative for myalgias.   Neurological: Negative for dizziness, tremors and seizures.   Psychiatric: Negative for depression.  Is not nervous/anxious and does not have insomnia.        Objective:     Physical Exam   Nursing note and vitals reviewed.  Constitutional: He is oriented and well-developed, well-nourished, and in no distress. He appears not diaphoretic. No distress.   HENT:   Head: Normocephalic and atraumatic.   Eyes: Conjunctivae are normal. Pupils are equal, round, and reactive to light. Right eye exhibits no discharge. Left eye exhibits no discharge.   Neck: Normal range of motion. Neck supple. No JVD present. Carotid bruit is not present. No rigidity. No tracheal deviation present. No thyromegaly present.        No carotid bruits    Cardiovascular: Normal rate, regular rhythm, normal heart sounds and intact distal pulses.  Exam reveals no gallop and no friction rub.    No murmur heard.  Pulmonary/Chest: Breath sounds normal. No respiratory distress. He has no wheezes. He has no rhonchi. He has no rales.   Abdominal: Bowel sounds are normal. He exhibits no distension and no mass. Soft. There is no organomegaly, splenomegaly or hepatomegaly. No tenderness. He has no rebound and no guarding.   Musculoskeletal: He exhibits no edema and no tenderness.   Lymphadenopathy:     He has no cervical adenopathy.   Neurological: He is alert and oriented. Gait normal. Coordination normal.   Skin: Skin is warm and dry. No rash noted. He is not diaphoretic.   Psychiatric: Mood, affect and judgment normal.         Ortho/Musculoskeletal:   He exhibits no edema and no tenderness.       Assessment & Plan:     Encounter Diagnoses   Code Name Primary? Qualifier   . 250.00BE Diabetes mellitus, type 2 Yes     Plan: POCT HGB A1C   . 401.9AJ Hypertension     . 530.81S GERD (gastroesophageal reflux disease)     . 414.00AE CAD (coronary artery disease)     . 696.0 Psoriatic Arthritis     . 780.79 Other malaise and fatigue       Office Visit on 03/19/2009   Component Date Value Range Status   .  HEMOGLOBIN A1C  03/19/2009 6.8  - Final       Continue current treatment regimen. Patient is doing well. Patient is to call with any problems prior to next apppointment if needed.  Will check some labs today.   Fatigue has resolved. Advised pt. To keep an eye on his blood sugars.

## 2009-03-19 NOTE — Progress Notes (Addendum)
Addended by: Jenell Milliner on: 03/19/2009 11:42:09 AM     Modules accepted: Orders

## 2009-03-19 NOTE — Progress Notes (Signed)
Spoke with pt-understands to pick up Naproxen for pain.  Enbrel 50 mg sq every week.  Artis Flock, LPN 11/21/1094, 0:45 PM

## 2009-03-19 NOTE — Progress Notes (Signed)
Subjective:  43 year old male returns to clinic for follow-up of psoriatic arthritis.  Patient states that over the month arthritic pain has exacerbated.  Right knee pain is most bothersome, but he also complains of right hip pain and neck pain.  He currently is on Entanercept 25mg  weekly.  He has also noted worsening skin manifestations with plaque appearance on right knee and some on the left.    Objective:    BP 116/74   Pulse 70   Temp 36.6 C (97.9 F)   Resp 14   Ht 1.778 m (5\' 10" )   Wt 95.437 kg (210 lb 6.4 oz)   SpO2 97%     General:  Well appearing male, NAD  HEENT:  PERRLA, no psoriatic lesions of the scalp.  Heart:  RRR, no murmurs  Lungs:  CTAB  Abdomen:  +BS/NT/ND  Exremities:  No swelling noted, bilateral pleural plaques of knees, right worse than left.  No erythema or marked swelling of joints.    Assessment/Plan:    1.  Psoriatic Arthritis Flare:  Increase Entanercept to 50 mg weekly or 25 mg on Monday and Thursday.  Refilled Dovonox and Naproxen.    2.  HTN:  Norvasc and Toprol  3.  Hypercholesterolemia:  Lipitor  4.  Diabetes Mellitus:  Metformin.  Follow up in 3 months, and follow with PCP as scheduled.    Jenell Milliner, DO  PGY-2 Internal Medicine

## 2009-03-19 NOTE — Progress Notes (Addendum)
Hard chart reveiwed.  Patient was already taking 50mg  of Entanercept weekly.  Will increase Naproxyn to 500 mg BID.

## 2009-03-21 ENCOUNTER — Encounter (INDEPENDENT_AMBULATORY_CARE_PROVIDER_SITE_OTHER): Payer: Self-pay

## 2009-04-10 ENCOUNTER — Other Ambulatory Visit (INDEPENDENT_AMBULATORY_CARE_PROVIDER_SITE_OTHER): Payer: Self-pay

## 2009-04-10 MED ORDER — ATORVASTATIN 20 MG TABLET
20.0000 mg | ORAL_TABLET | Freq: Every day | ORAL | Status: DC
Start: 2009-04-10 — End: 2009-04-23

## 2009-04-10 NOTE — Telephone Encounter (Signed)
Patient called and states that he ordered his Lipitor through the mail but it is going to take a few weeks.  Wants to know if will call in a month supply to the [facility] in [place].  Staci Acosta, LPN

## 2009-04-10 NOTE — Telephone Encounter (Signed)
Called and notifed patient that medication was called into the pharmacy.  Staci Acosta, LPN

## 2009-04-16 ENCOUNTER — Ambulatory Visit (INDEPENDENT_AMBULATORY_CARE_PROVIDER_SITE_OTHER): Payer: Self-pay | Admitting: RHEUMATOLOGY

## 2009-04-16 NOTE — Telephone Encounter (Signed)
Triage Queue message copied by Baker Pierini on Tue Apr 16, 2009 10:55 AM  ------   Message from: Geradine Girt   Created: Tue Apr 16, 2009 10:35 AM    >> COROB, AGNES Tue Apr 16, 2009 10:35 am  Quentin Mulling pt - pharamcy calling in regard to Dovonex ointment. they do not have it (ointment) nor can they get it. Pharmcist want to know if they can give the pt the cream instead. Please call & let them know. Thank you.

## 2009-04-23 ENCOUNTER — Encounter (INDEPENDENT_AMBULATORY_CARE_PROVIDER_SITE_OTHER): Payer: Self-pay

## 2009-06-25 ENCOUNTER — Ambulatory Visit (INDEPENDENT_AMBULATORY_CARE_PROVIDER_SITE_OTHER): Payer: PRIVATE HEALTH INSURANCE | Admitting: Family Medicine

## 2009-06-25 ENCOUNTER — Ambulatory Visit
Admission: RE | Admit: 2009-06-25 | Discharge: 2009-06-25 | Disposition: A | Discharge status: Home / Self Care | Payer: 59 | Attending: Surgical | Admitting: Surgical

## 2009-06-25 ENCOUNTER — Ambulatory Visit (INDEPENDENT_AMBULATORY_CARE_PROVIDER_SITE_OTHER): Payer: 59 | Admitting: RHEUMATOLOGY

## 2009-06-25 VITALS — BP 116/70 | HR 97 | Temp 95.7°F | Resp 18 | Wt 215.2 lb

## 2009-06-25 DIAGNOSIS — M545 Low back pain, unspecified: Secondary | ICD-10-CM | POA: Insufficient documentation

## 2009-06-25 MED ORDER — MELOXICAM 15 MG TABLET
15.0000 mg | ORAL_TABLET | Freq: Every day | ORAL | Status: DC
Start: 2009-06-25 — End: 2010-01-23

## 2009-06-25 NOTE — Progress Notes (Signed)
Subjective:     Patient ID:  Chase [name] is an 43 y.o. male     Chief Complaint:  Chief Complaint   Patient presents with   . Follow-up     3 month   . Blood sugar problem     pt states he has been having problems with his blood sugar being low and then going high when he eats   . Fatigue         HPI  Patient here today for routine follow up.  has a past medical history of Hypertension, GERD (gastroesophageal reflux disease), Unspecified urinary calculus, Arthritis with psoriasis, Fracture of distal end of tibia, Fracture of distal fibula, CAD (coronary artery disease), and Diabetes mellitus, type 2 (03/19/2009).Says he feels very tired. Is sleeping a lot . Feels like he may be anemic. Usually BS is running about 110's. Felt bad a couple times when it was about 80.       BP 116/70   Pulse 97   Temp (Src) 35.4 C (95.7 F) (Tympanic)   Resp 18   Wt 97.614 kg (215 lb 3.2 oz)   SpO2 97%'      Review of Systems   Constitutional: Positive for malaise/fatigue. Negative for fever, chills and weakness.   Skin: Negative for rash.   HENT: Negative for headaches and sore throat.    Eyes: Negative for blurred vision and double vision.   Cardiovascular: Negative for chest pain, palpitations, orthopnea and leg swelling.   Respiratory: Negative for cough.  Is not experiencing shortness of breath or wheezing.   Gastrointestinal: Negative for nausea, vomiting, abdominal pain and diarrhea.   Genitourinary: Negative for dysuria, urgency and frequency.   Musculoskeletal: Negative for myalgias.   Neurological: Negative for dizziness, tremors and seizures.   Psychiatric: Negative for depression.  Is not nervous/anxious and does not have insomnia.        Objective:     Physical Exam   Nursing note and vitals reviewed.  Constitutional: He is oriented and well-developed, well-nourished, and in no distress. He appears not diaphoretic. No distress.   HENT:   Head: Normocephalic and atraumatic.    Eyes: Conjunctivae are normal. Pupils are equal, round, and reactive to light. Right eye exhibits no discharge. Left eye exhibits no discharge.   Neck: Normal range of motion. Neck supple. No JVD present. Carotid bruit is not present. No rigidity. No tracheal deviation present. No thyromegaly present.        No carotid bruits   Cardiovascular: Normal rate, regular rhythm, normal heart sounds and intact distal pulses.  Exam reveals no gallop and no friction rub.    No murmur heard.  Pulmonary/Chest: Breath sounds normal. No respiratory distress. He has no wheezes. He has no rhonchi. He has no rales.   Abdominal: Bowel sounds are normal. He exhibits no distension and no mass. Soft. There is no organomegaly, splenomegaly or hepatomegaly. No tenderness. He has no rebound and no guarding.   Musculoskeletal: He exhibits no edema and no tenderness.   Lymphadenopathy:     He has no cervical adenopathy.   Neurological: He is alert and oriented. Gait normal. Coordination normal.   Skin: Skin is warm and dry. No rash noted. He is not diaphoretic.   Psychiatric: Mood, affect and judgment normal.         Ortho/Musculoskeletal:   He exhibits no edema and no tenderness.       Assessment & Plan:     Encounter Diagnoses  Code Name Primary? Qualifier   . 250.00BE Diabetes mellitus, type 2 Yes     Plan: POCT HGB A1C   . 401.9AJ Hypertension     . 530.81S GERD (gastroesophageal reflux disease)     . 414.00AE CAD (coronary artery disease)     . 696.0 Psoriatic Arthritis         Office Visit on 06/25/2009   Component Date Value Range Status   . HEMOGLOBIN A1C  06/25/2009 6.6  - Final   Office Visit on 03/19/2009   Component Date Value Range Status   . HEMOGLOBIN A1C  03/19/2009 6.8  - Final   Office Visit on 01/22/2009   Component Date Value Range Status   . HEMOGLOBIN A1C  01/22/2009 7.0  - Final   Hospital Outpatient Visit on 12/17/2008   Component Date Value Range Status   . WBC (THOU/UL) 12/17/2008 9.3  3.5-11.0 Final    . RBC (MIL/uL) 12/17/2008 5.21  4.46-5.70 Final   . HGB (g/dL) 16/08/9603 54.0  98.1-19.1 Final   . HCT (%) 12/17/2008 46.1  39.8-50.2 Final   . MCV (fL) 12/17/2008 88.5  82.0-99.0 Final   . MCH (pg) 12/17/2008 30.2  27.4-33.0 Final   . MCHC (g/dL) 47/82/9562 13.0  86.5-78.4 Final   . RDW (%) 12/17/2008 12.2  10.2-14.0 Final   . PLATELET COUNT (THO/UL) 12/17/2008 264  140-450 Final   . MPV (FL) 12/17/2008 7.2* 7.4-10.4 Final   . PMN'S (%) 12/17/2008 72  40-75 Final   . PMN ABS (THOU/uL) 12/17/2008 6.760  1.5-7.7 Final   . LYMPHOCYTES (%) 12/17/2008 19* 20-45 Final   . LYMPHS ABS (THOU/uL) 12/17/2008 1.760  1.0-4.8 Final   . MONOCYTES (%) 12/17/2008 6  4-13 Final   . MONOS ABS (THOU/uL) 12/17/2008 0.576  0.1-0.9 Final   . EOSINOPHIL (%) 12/17/2008 2  1-6 Final   . EOS ABS (THOU/uL) 12/17/2008 0.174  0.1-0.3 Final   . BASOPHILS (%) 12/17/2008 1  0-1 Final   . BASOS ABS (THOU/uL) 12/17/2008 0.067  0.0-0.2 Final   . NRBC'S (/100WBC) 12/17/2008 0  0 Final   . CREATININE (mg/dL) 69/62/9528 4.13  2.44-0.10 Final   . ESTIMATED GLOMERULAR FILTRATION RA* (ml/min/1.29m2) 12/17/2008 >59  >59 Final    Comment: IF PATIENT IS A[employer] AMERICAN MULTIPLY RESULT BY 1.210                           (NOTE)                           Chronic Kidney Disease Stages based on GFR                           Stage 1   Normal GFR                           Stage 2   60 to 89                           Stage 3   30 to 59                           Stage 4   15 to 29  Stage 5   <15   . AST (SGOT) (U/L) 12/17/2008 20  10-40 Final       Continue current treatment regimen. Patient is doing well. Patient is to call with any problems prior to next apppointment if needed.    Is going to see Rheumatologist today. Told him to discuss his fatigue with him as well. Will give a labslip to have CBC, thyroid tests, lipids and chem 14.      Julian Hy, DO,06/25/2009,1:29 PM

## 2009-07-03 ENCOUNTER — Ambulatory Visit (INDEPENDENT_AMBULATORY_CARE_PROVIDER_SITE_OTHER): Payer: Self-pay | Admitting: RHEUMATOLOGY

## 2009-07-03 NOTE — Telephone Encounter (Signed)
Triage Queue message copied by Ileene Hutchinson on Wed Jul 03, 2009 8:38 AM  ------   Message from: Angelia Mould   Created: Tue Jul 02, 2009 12:34 PM    >> Angelia Mould Tue Jul 02, 2009 12:34 pm  Dr Quentin Mulling, the patient is calling to get the results of the x-rays that were done on his back and hip last Tuesday. The patient can be reached at [phone].

## 2009-07-03 NOTE — Telephone Encounter (Signed)
Triage Queue message copied by Ileene Hutchinson on Wed Jul 03, 2009 8:38 AM  ------   Message from: [place], [place]   Created: Mon Jul 01, 2009 9:39 AM    >> Sander Nephew ZOX Jul 01, 2009 9:39 am  Dr. Quentin Mulling: the patient is calling for xray results of back and hip that were done August 10th, here at our facility. He states he is at work but you can call his cell number, if he does not answer leave voice mail

## 2009-07-05 ENCOUNTER — Telehealth (INDEPENDENT_AMBULATORY_CARE_PROVIDER_SITE_OTHER): Payer: Self-pay

## 2009-07-05 ENCOUNTER — Encounter (INDEPENDENT_AMBULATORY_CARE_PROVIDER_SITE_OTHER): Payer: Self-pay

## 2009-07-05 NOTE — Telephone Encounter (Signed)
Called and notified patient of results.  Called and left message on machine at [facility] in [place] for Trilipix 135mg .  Lab appt made.  Alfredia Client Cyan Clippinger, LPN 2/53/6644, 03:47 AM

## 2009-07-11 ENCOUNTER — Ambulatory Visit (INDEPENDENT_AMBULATORY_CARE_PROVIDER_SITE_OTHER): Payer: Self-pay | Admitting: RHEUMATOLOGY

## 2009-07-11 NOTE — Telephone Encounter (Signed)
Triage Queue message copied by Ileene Hutchinson on Thu Jul 11, 2009 9:35 AM  ------   Message from: [name]   Created: Tue Jul 09, 2009 9:45 AM    >> [name] Tue Jul 09, 2009 9:45 am  Dr Evalina Field  Please call the pt with his x-rays results from Aug 10th. Thank you.

## 2009-07-15 ENCOUNTER — Ambulatory Visit (INDEPENDENT_AMBULATORY_CARE_PROVIDER_SITE_OTHER): Payer: Self-pay | Admitting: RHEUMATOLOGY

## 2009-07-15 NOTE — Telephone Encounter (Signed)
Triage Queue message copied by Ileene Hutchinson on Mon Jul 15, 2009 1:11 PM  ------   Message from: Michel Bickers   Created: Mon Jul 15, 2009 12:21 PM    >> Michel Bickers WUJ Jul 15, 2009 12:21 pm  DR Quentin Mulling,    Repeat message. Patient is upset that he hasnt got a call back yet. I confirmed that the phone number is correct.    >> Gibson Ramp Fri Jul 12, 2009 12:18 pm  Pt called again for test results, he said that if he doesn't answer that it is okay to leave them on his voicemail  >> [name] Tue Jul 09, 2009 9:45 am  Dr Evalina Field  Please call the pt with his x-rays results from Aug 10th. Thank you.

## 2009-07-16 ENCOUNTER — Encounter (INDEPENDENT_AMBULATORY_CARE_PROVIDER_SITE_OTHER): Payer: Self-pay | Admitting: RHEUMATOLOGY

## 2009-07-16 NOTE — Telephone Encounter (Signed)
I called twice, no ans & no machine picked up.  ----- Message -----  From: Ileene Hutchinson  Sent: Jul 11, 2009 9:35 AM  To: Evalina Field

## 2009-07-19 NOTE — Telephone Encounter (Signed)
Message No ans. If he calls tell him i sent him a letter. Has an Extra bone in his back  ----- Message -----  From: Ileene Hutchinson  Sent: Jul 15, 2009 1:12 PM  To: Evalina Field

## 2009-07-25 ENCOUNTER — Telehealth (INDEPENDENT_AMBULATORY_CARE_PROVIDER_SITE_OTHER): Payer: Self-pay | Admitting: Family Medicine

## 2009-08-06 ENCOUNTER — Telehealth (INDEPENDENT_AMBULATORY_CARE_PROVIDER_SITE_OTHER): Payer: Self-pay

## 2009-08-06 MED ORDER — FENOFIBRATE MICRONIZED 67 MG CAPSULE
67.00 mg | ORAL_CAPSULE | Freq: Every morning | ORAL | Status: DC
Start: 2009-08-06 — End: 2010-01-23

## 2009-08-12 ENCOUNTER — Encounter (INDEPENDENT_AMBULATORY_CARE_PROVIDER_SITE_OTHER): Payer: Self-pay | Admitting: Clinical Medical Laboratory

## 2009-09-18 ENCOUNTER — Ambulatory Visit (INDEPENDENT_AMBULATORY_CARE_PROVIDER_SITE_OTHER): Payer: 59

## 2009-09-18 ENCOUNTER — Other Ambulatory Visit (INDEPENDENT_AMBULATORY_CARE_PROVIDER_SITE_OTHER): Payer: Self-pay | Admitting: EXTERNAL

## 2009-09-18 ENCOUNTER — Other Ambulatory Visit (INDEPENDENT_AMBULATORY_CARE_PROVIDER_SITE_OTHER): Payer: Self-pay | Admitting: Family Medicine

## 2009-09-27 ENCOUNTER — Other Ambulatory Visit (INDEPENDENT_AMBULATORY_CARE_PROVIDER_SITE_OTHER): Payer: Self-pay

## 2009-09-27 MED ORDER — METFORMIN ER 500 MG TABLET,EXTENDED RELEASE 24 HR
1000.0000 mg | ORAL_TABLET | Freq: Every day | ORAL | Status: DC
Start: 2009-09-27 — End: 2010-01-23

## 2009-09-27 MED ORDER — AMLODIPINE 10 MG TABLET
10.0000 mg | ORAL_TABLET | Freq: Every day | ORAL | Status: DC
Start: 2009-09-27 — End: 2010-01-23

## 2009-09-27 MED ORDER — PIOGLITAZONE 30 MG TABLET
30.0000 mg | ORAL_TABLET | Freq: Every day | ORAL | Status: DC
Start: 2009-09-27 — End: 2010-01-23

## 2009-09-27 MED ORDER — ENALAPRIL MALEATE 20 MG TABLET
20.0000 mg | ORAL_TABLET | Freq: Every day | ORAL | Status: DC
Start: 2009-09-27 — End: 2010-11-24

## 2009-10-01 ENCOUNTER — Ambulatory Visit (INDEPENDENT_AMBULATORY_CARE_PROVIDER_SITE_OTHER): Payer: PRIVATE HEALTH INSURANCE | Admitting: Family Medicine

## 2009-10-01 NOTE — Progress Notes (Signed)
Subjective:     Patient ID:  Chase [name] is an 43 y.o. male     Chief Complaint:  Chief Complaint   Patient presents with   . Follow-up     3 month         HPI  Patient here today for routine follow up. Has had no significant problems since last visit and is doing well. has a past medical history of Hypertension, GERD (gastroesophageal reflux disease), Unspecified urinary calculus, Arthritis with psoriasis, Fracture of distal end of tibia, Fracture of distal fibula, CAD (coronary artery disease), and Diabetes mellitus, type 2 (03/19/2009).      Review of Systems   Constitutional: Negative for fever, chills and weakness.   Skin: Negative for rash.   HENT: Negative for headaches and sore throat.    Eyes: Negative for blurred vision and double vision.   Cardiovascular: Negative for chest pain, palpitations, orthopnea and leg swelling.   Respiratory: Negative for cough.  Is not experiencing shortness of breath or wheezing.   Gastrointestinal: Negative for nausea, vomiting, abdominal pain and diarrhea.   Genitourinary: Negative for dysuria, urgency and frequency.   Musculoskeletal: Positive for joint pain (from RA). Negative for myalgias.   Neurological: Negative for dizziness, tremors and seizures.   Psychiatric: Negative for depression.  Is not nervous/anxious and does not have insomnia.        Objective:       BP 130/88   Pulse 89   Temp (Src) 36.2 C (97.2 F) (Tympanic)   Resp 18   Ht 1.746 m (5' 8.75")   Wt 100.517 kg (221 lb 9.6 oz)   SpO2 99%    Physical Exam   Nursing note and vitals reviewed.  Constitutional: He is oriented and well-developed, well-nourished, and in no distress. He appears not diaphoretic. No distress.   HENT:   Head: Normocephalic and atraumatic.   Eyes: Conjunctivae are normal. Pupils are equal, round, and reactive to light. Right eye exhibits no discharge. Left eye exhibits no discharge.    Neck: Normal range of motion. Neck supple. No JVD present. Carotid bruit is not present. No rigidity. No tracheal deviation present. No thyromegaly present.        No carotid bruits   Cardiovascular: Normal rate, regular rhythm, normal heart sounds and intact distal pulses.  Exam reveals no gallop and no friction rub.    No murmur heard.  Pulmonary/Chest: Breath sounds normal. No respiratory distress. He has no wheezes. He has no rhonchi. He has no rales.   Abdominal: Bowel sounds are normal. He exhibits no distension and no mass. Soft. There is no organomegaly, splenomegaly or hepatomegaly. No tenderness. He has no rebound and no guarding.   Musculoskeletal: He exhibits no edema and no tenderness.   Lymphadenopathy:     He has no cervical adenopathy.   Neurological: He is alert and oriented. Gait normal. Coordination normal.   Skin: Skin is warm and dry. No rash noted. He is not diaphoretic.   Psychiatric: Mood, affect and judgment normal.         Ortho/Musculoskeletal:   He exhibits no edema and no tenderness.       Assessment & Plan:     Encounter Diagnoses   Code Name Primary? Qualifier   . 401.9AJ Hypertension      Plan: COMPREHENSIVE METABOLIC PNL, FASTING   . 530.81S GERD (gastroesophageal reflux disease)     . 414.00AE CAD (coronary artery disease)      Plan:  COMPREHENSIVE METABOLIC PNL, FASTING   . 250.00BE Diabetes mellitus, type 2      Plan: LIPID PANEL, COMPREHENSIVE METABOLIC PNL, FASTING, HEMOGLOBIN A1C   . 696.0 Psoriatic Arthritis     . 729.1 Unspecified myalgia and myositis      Plan: THYROID STIMULATING HORMONE (SENSITIVE TSH), VITAMIN B12, FOLATE, CREATINE KINASE, CK       Continue current treatment regimen. Patient is doing well. Patient is to call with any problems prior to next apppointment if needed.    Is going to see Rheum this week, will give order for labs, including CPK and HgA1c

## 2009-10-03 ENCOUNTER — Ambulatory Visit (INDEPENDENT_AMBULATORY_CARE_PROVIDER_SITE_OTHER): Payer: 59 | Admitting: RHEUMATOLOGY

## 2009-10-03 ENCOUNTER — Encounter (INDEPENDENT_AMBULATORY_CARE_PROVIDER_SITE_OTHER): Payer: Self-pay | Admitting: RHEUMATOLOGY

## 2009-10-03 NOTE — Progress Notes (Signed)
Administered 0.5ml of the flu vaccine to pt in pt left deltoid, pt tolerated well with no problems Kristy Haddix

## 2009-10-03 NOTE — Patient Instructions (Signed)
INACTIVATED INFLUENZA  VACCINE 2010-11      WHAT YOU NEED TO KNOW  Many Vaccine Information Statements are available in Spanish and other languages. See http://www.immunize.org/vis  Hojas de lnformacin Sobre Vacunas estn disponibles en Espanol y en muchos otros idiomas. Visite http://www.immunize.org/vis   Why get vaccinated?  Influenza ("flu") is a contagious disease.  It is caused by the influenza virus, which can be spread by coughing, sneezing, or nasal secretions.    Anyone can get influenza, but rates of infection are the  highest  among children. For most people, symptoms last only a few  days. They include:  . fever . sore throat . chills . fatigue  . cough . headache . muscle aches    Other illnesses can have the same symptoms and are often mistaken for influenza.    Infants, the elderly, pregnant women, and people with certain health conditions - such as heart, lung or kidney disease or a weakened immune system - can get much sicker. Flu can cause high fever and pneumonia, and make existing medical conditions worse. It can cause diarrhea and seizures in children. Each year thousands of people die from seasonal influenza and even more require hospitalization.  By getting vaccinated you can protect yourself from  influenza and may also avoid spreading influenza to others.       Inactivated influenza vaccine  There are two types of influenza vaccine:  1. Inactivated (killed) vaccine, or the "flu shot" is given by injection into the muscle.    2. Live, attenuated (weakened) influenza vaccine is sprayed into the nostrils. This vaccine is described in a separate Vaccine Information Statement.    A "high-dose" inactivated influenza vaccine is available for people 65 years of age and older. Ask your healthcare provider for more information.     Influenza viruses are always changing, so annual vaccination is recommended. Each year scientists try to match the viruses in the vaccine to those most likely to cause flu that year.  The 2010-2011 vaccine provides protection against A/H iN 1 (pandemic) influenza and two other influenza viruses - influenza A/H3N2 and influenza B. It will not prevent illness caused by other viruses.  It takes up to 2 weeks for protection to develop after the shot. Protection lasts about a year.    Some inactivated influenza vaccine contains a preservative called thimerosal. Thimerosal-free influenza vaccine is available. Ask your healthcare provider for more information.     Who should get inactivated influenza vaccine and when?  WHO  All people 6 months of age and older should get flu vaccine.    Vaccination is especially important for people at higher risk of severe influenza and their close contacts, including healthcare personnel and close contacts of children younger than 6 months.    People who got the 2009 H1N1 (pandemic) influenza vaccine, or had pandemic flu in 2009, should still get the 2010-2011 seasonal influenza vaccine.    WHEN  Getting the vaccine as soon as it is available will provide protection if the flu season comes.  You can get the vaccine as long as illness is occurring in your community.   Influenza can occur at any time, but most influenza occurs from November through May. In recent seasons, most infections have occurred in January and February. Getting vaccinated in December, or even later, will still be beneficial in most years   .  Adults and older children need one dose of influenza vaccine each year. But some children younger than   9 years of age need two doses to be protected. Ask your healthcare provider.    Influenza vaccine may be given at the same time as other vaccines, including pneumococcal vaccine.      Some people should not get inactivated influenza vaccine or should wait      Tell your healthcare provider if you have any severe (life-threatening) allergies. Allergic reactions to influenza vaccine are rare.   Influenza vaccine virus is grown in eggs. People with a severe egg allergy should not get influenza vaccine.The safety of vaccines is  alwaysbeing monitored   -A severe allergy to any vaccine component is also a reason not to get the vaccine.  -If you ever had a severe reaction after a dose of influenza vaccine, tell your healthcare provider.    Tell your healthcare provider if you ever had GuillainBarr Syndrome (a severe paralytic illness, also called GB S). Your provider will help you decide whether the vaccine is recommended for you.    People who are moderately or severely ill should usually wait until they recover before getting flu vaccine. If you are ill, talk to your healthcare provider about whether to reschedule the vaccination. People with a mild illness can usually get the vaccine.       What are the risks from inactivated influenza vaccine?    A vaccine, like any medicine, could possibly cause serious problems, such as severe allergic reactions. The risk of a vaccine causing serious harm, or death, is extremely small.    Serious problems from inactivated influenza vaccine are very rare. The viruses in inactivated influenza vaccine have been killed, so you cannot get influenza from the vaccine.    Mild problems:  .?soreness, redness, or swelling where the shot was given  .?hoarseness; sore, red or itchy eyes; cough  .?fever . aches  If these problems occur, they usually begin soon after the  shot and last 1-2 days.    Severe problems:  .?Life-threatening allergic reactions from vaccines are very rare. If they do occur, it is usually within a few minutes to a few hours after the shot.     .?In 1976, a type of inactivated influenza (swine flu) vaccine was associated with Guillain-Barr Syndrome (GBS). Since then, flu vaccines have not been clearly linked to GBS. However, if there is a risk of GBS from current flu vaccines, it would be no more than 1 or 2 cases per million people vaccinated. This is much lower than the risk of severe influenza, which can be prevented by vaccination.  more information, visit:  http://www.cdc.gov/vaccinesafety/Vaccine_Monitoring/Index.html  and  Http://www.cdc.gov/vaccinesafety/Activities/Activities_Index.html    What if there is a severe reaction?  What should I look for?  Any unusual condition, such as a high fever or behavior changes. Signs of a severe allergic reaction can include difficulty breathing, hoarseness or wheezing, hives, paleness, weakness, a fast heart beat or dizziness.    What should I do?  .?Call a doctor, or get the person to a doctor right away.  .?Tell the doctor what happened, the date and time it happened, and when the vaccination was given.  .?Ask your healthcare provider to report the reaction by  filing a Vaccine Adverse Event Reporting System  (VAERS) form. Or you can file this report through the  VAERS website at http ://www.vaers.hhs.gov, or by calling  1-800-822-7967.  VAERS does not provide medical advice.    The National Vaccine Injury Compensation Program  The National Vaccine Injury Compensation Program (VICP) was created   in 1986.  People who believe they may have been injured by a vaccine can learn about the program and about filing a claim by calling 1-800-338-2382, or visiting the VICP website at:  http ://www.hrsa.gov/vaccinecompensation.     How can I learn more?  .?Ask your healthcare provider. They can give you the vaccine package insert or suggest other sources of information.  .?Call your local or state health department.  C9  .?Contact the Centers for Disease Control and Prevention  (CDC):   - Call 1-800-232-4636 (1-800-CDC-INFO) or  - Visit CDC's website at http:Ilwww.cdc.govlflu  DEPARTMENT OF HEALTH AND HUMAN SERVICES  CONTROL AND PREVENTION        Vaccine Information Statement (Interim)  Inactivated Influenza Vaccine (06/25/09) 42 U.S.C. 300aa-26  One brand of inactivated flu vaccine, called Afluria, should not be given to children 8 years of age or younger, except in special circumstances. An inactivated vaccine was associated with fevers and fever-related seizures in young children in [place]. Ask your healthcare provider for more information.

## 2009-10-15 ENCOUNTER — Ambulatory Visit (HOSPITAL_BASED_OUTPATIENT_CLINIC_OR_DEPARTMENT_OTHER)
Admission: RE | Admit: 2009-10-15 | Discharge: 2009-10-15 | Disposition: A | Discharge status: Home / Self Care | Payer: 59 | Source: Ambulatory Visit | Attending: Neurology | Admitting: Neurology

## 2009-10-15 ENCOUNTER — Ambulatory Visit
Admission: RE | Admit: 2009-10-15 | Discharge: 2009-10-15 | Disposition: A | Discharge status: Home / Self Care | Payer: 59 | Attending: RHEUMATOLOGY | Admitting: RHEUMATOLOGY

## 2009-10-15 ENCOUNTER — Other Ambulatory Visit (INDEPENDENT_AMBULATORY_CARE_PROVIDER_SITE_OTHER): Payer: Self-pay | Admitting: Neurology

## 2009-10-15 DIAGNOSIS — M549 Dorsalgia, unspecified: Secondary | ICD-10-CM | POA: Insufficient documentation

## 2009-10-18 ENCOUNTER — Ambulatory Visit (INDEPENDENT_AMBULATORY_CARE_PROVIDER_SITE_OTHER): Payer: Self-pay | Admitting: RHEUMATOLOGY

## 2009-10-18 NOTE — Telephone Encounter (Signed)
Triage Queue message copied by Ileene Hutchinson A on Fri Oct 18, 2009 1:28 PM  ------   Message from: Burnell Blanks A   Created: Fri Oct 18, 2009 12:59 PM    >> Pamala Hurry Fri Oct 18, 2009 12:59 pm  Dr brick pt would like his MRI results please.

## 2009-10-22 NOTE — Telephone Encounter (Signed)
Triage Queue message copied by Baker Pierini on Tue Oct 22, 2009 9:54 AM  ------   Message from: [name]   Created: Tue Oct 22, 2009 9:49 AM    >> [name] Tue Oct 22, 2009 9:49 am  DR Evalina Field  Pt called Friday and is calling again today for his MRI results, Please return his call @ [phone].

## 2009-10-22 NOTE — Telephone Encounter (Signed)
Phoned pts home number; no answer and no opportunity to leave message.

## 2009-10-23 NOTE — Telephone Encounter (Signed)
Called and gave pt message per dr brick, he was asking what he could do for the pain that he is having in his lower back and hips because he stated that the pain is getting worse with the cold.  He was also wanting to know what he could do about the wear and tear arthritis, told pt I would ask dr brick and call and let him know what to do

## 2009-10-23 NOTE — Telephone Encounter (Signed)
Message Telll them mild wear and tear arthritis.  ----- Message -----  From: Baker Pierini  Sent: Oct 22, 2009 9:55 AM  To: Evalina Field

## 2009-10-23 NOTE — Telephone Encounter (Signed)
Attempted to call pt, man answered phone and stated the pt would be back in about 5 min to try back then

## 2009-10-23 NOTE — Telephone Encounter (Signed)
Call pt on cell phone at [phone]

## 2009-10-23 NOTE — Telephone Encounter (Signed)
Message Told him its mileage. And he cant make it go away. Dont gain wt. Try dolobid 500 bid with food. Called in..told I think no narcotices.  ----- Message -----  From: Baker Pierini  Sent: Oct 23, 2009 9:57 AM  To: Evalina Field

## 2009-12-31 ENCOUNTER — Encounter (INDEPENDENT_AMBULATORY_CARE_PROVIDER_SITE_OTHER): Payer: Self-pay | Admitting: Family Medicine

## 2010-01-14 ENCOUNTER — Encounter (INDEPENDENT_AMBULATORY_CARE_PROVIDER_SITE_OTHER): Payer: 59 | Admitting: RHEUMATOLOGY

## 2010-01-23 ENCOUNTER — Ambulatory Visit (INDEPENDENT_AMBULATORY_CARE_PROVIDER_SITE_OTHER): Payer: PRIVATE HEALTH INSURANCE | Admitting: Family Medicine

## 2010-01-23 ENCOUNTER — Encounter (INDEPENDENT_AMBULATORY_CARE_PROVIDER_SITE_OTHER): Payer: Self-pay | Admitting: Family Medicine

## 2010-01-23 VITALS — BP 140/82 | HR 89 | Temp 97.5°F | Resp 18 | Ht 69.25 in | Wt 218.8 lb

## 2010-01-23 MED ORDER — METFORMIN ER 500 MG TABLET,EXTENDED RELEASE 24 HR
1000.0000 mg | ORAL_TABLET | Freq: Every day | ORAL | Status: DC
Start: 2010-01-23 — End: 2010-02-03

## 2010-01-23 MED ORDER — PIOGLITAZONE 30 MG TABLET
30.0000 mg | ORAL_TABLET | Freq: Every day | ORAL | Status: DC
Start: 2010-01-23 — End: 2010-02-03

## 2010-01-23 MED ORDER — FENOFIBRATE MICRONIZED 67 MG CAPSULE
67.00 mg | ORAL_CAPSULE | Freq: Every morning | ORAL | Status: DC
Start: 2010-01-23 — End: 2010-02-03

## 2010-01-23 MED ORDER — MELOXICAM 15 MG TABLET
15.0000 mg | ORAL_TABLET | Freq: Every day | ORAL | Status: DC
Start: 2010-01-23 — End: 2010-02-03

## 2010-01-23 MED ORDER — AMLODIPINE 10 MG TABLET
10.0000 mg | ORAL_TABLET | Freq: Every day | ORAL | Status: DC
Start: 2010-01-23 — End: 2010-02-03

## 2010-01-23 MED ORDER — METOPROLOL SUCCINATE ER 50 MG TABLET,EXTENDED RELEASE 24 HR
50.0000 mg | ORAL_TABLET | Freq: Every day | ORAL | Status: DC
Start: 2010-01-23 — End: 2010-02-03

## 2010-01-23 MED ORDER — SIMVASTATIN 40 MG TABLET
40.00 mg | ORAL_TABLET | Freq: Every evening | ORAL | Status: DC
Start: 2010-01-23 — End: 2010-02-03

## 2010-01-23 NOTE — Progress Notes (Signed)
Subjective:     Patient ID:  Chase [name] is an 44 y.o. male     Chief Complaint:  Chief Complaint   Patient presents with   . Follow-up     pt is not fasting for labs         HPI  Patient here today for routine follow up. has a past medical history of Hypertension, GERD (gastroesophageal reflux disease), Urinary calculus, unspecified, Arthritis with psoriasis, Fracture of distal end of tibia, Fracture of distal fibula, CAD (coronary artery disease), and Diabetes mellitus, type 2 (03/19/2009).Has been off of some of his meds recently because he ran out.  Says over the past week, BS have been in the 200's and he has been a little queasy.  Was working away from home, but is home now for a while. Had been off work and under a lot of stress.  Is back to work now.     The patient's past medical, social and family histories were reviewed as well as current medications and allergies.  Changes were made in the chart where indicated.       Review of Systems   Constitutional: Negative for fever, chills and weakness.   Skin: Negative for rash.   HENT: Negative for headaches, nosebleeds and sore throat.    Eyes: Positive for blurred vision (just got new glasses, not wearing). Negative for double vision.   Cardiovascular: Negative for chest pain, palpitations, orthopnea and leg swelling.   Respiratory: Negative for cough and sputum production.  Is not experiencing shortness of breath or wheezing.   Gastrointestinal: Negative for heartburn, nausea, vomiting, abdominal pain, diarrhea, constipation and blood in stool.   Genitourinary: Negative for dysuria, urgency, frequency and hematuria.   Musculoskeletal: Positive for neck pain and back pain. Negative for myalgias.   Endo/Heme/Allergies: Negative for polydipsia.    Neurological: Negative for dizziness, tremors and seizures.   Psychiatric: Negative for depression.  Is not nervous/anxious and does not have insomnia.        Objective:        BP 140/82   Pulse 89   Temp (Src) 36.4 C (97.5 F) (Tympanic)   Resp 18   Ht 1.759 m (5' 9.25")   Wt 99.247 kg (218 lb 12.8 oz)   SpO2 98%    Physical Exam   Nursing note and vitals reviewed.  Constitutional: He is oriented and well-developed, well-nourished, and in no distress. He appears not diaphoretic. No distress.   HENT:   Head: Normocephalic and atraumatic.   Eyes: Conjunctivae are normal. Pupils are equal, round, and reactive to light. Right eye exhibits no discharge. Left eye exhibits no discharge.   Neck: Normal range of motion. Neck supple. No JVD present. Carotid bruit is not present. No rigidity. No tracheal deviation present. No thyromegaly present.        No carotid bruits   Cardiovascular: Normal rate, regular rhythm, normal heart sounds and intact distal pulses.  Exam reveals no gallop and no friction rub.    No murmur heard.  Pulmonary/Chest: Breath sounds normal. No respiratory distress. He has no wheezes. He has no rhonchi. He has no rales.   Abdominal: Bowel sounds are normal. He exhibits no distension and no mass. Soft. There is no organomegaly, splenomegaly or hepatomegaly. No tenderness. He has no rebound and no guarding.        obese   Musculoskeletal: He exhibits no edema and no tenderness.   Lymphadenopathy:     He has  no cervical adenopathy.   Neurological: He is alert and oriented. Gait normal. Coordination normal.   Skin: Skin is warm and dry. No rash noted. He is not diaphoretic.   Psychiatric: Mood, affect and judgment normal.         Ortho/Musculoskeletal:   He exhibits no edema and no tenderness.       Assessment & Plan:     Encounter Diagnoses   Code Name Primary? Qualifier   . 250.00BE Diabetes mellitus, type 2 Yes     Plan: POCT HGB A1C, METFORMIN ER 500 MG 24 HR TAB, PIOGLITAZONE 30 MG TAB   . 401.9AJ Hypertension      Plan: METOPROLOL SUCCINATE ER 50 MG 24 HR TAB, AMLODIPINE 10 MG TAB   . 530.81S GERD (gastroesophageal reflux disease)      . 414.00AE CAD (coronary artery disease)     . 696.0 Psoriatic Arthritis      Plan: MELOXICAM 15 MG TAB   . 272.4 Other and unspecified hyperlipidemia      Plan: SIMVASTATIN 40 MG TAB, FENOFIBRATE MICRONIZED 67 MG CAP         Office Visit on 01/23/2010   Component Date Value Range Status   . HEMOGLOBIN A1C  01/23/2010 7.6%  - Final   Office Visit on 06/25/2009   Component Date Value Range Status   . HEMOGLOBIN A1C  06/25/2009 6.6  - Final           Continue current treatment regimen. Patient is doing well. Patient is to call with any problems prior to next apppointment if needed.    Will draw fasting labs today.     Discussed need to lose weight/increase exercise.  Patient understands and agrees. Discussed adding meds for BS control, hee would rather try diet and exercise.

## 2010-01-29 ENCOUNTER — Other Ambulatory Visit (INDEPENDENT_AMBULATORY_CARE_PROVIDER_SITE_OTHER): Payer: Self-pay

## 2010-01-29 NOTE — Telephone Encounter (Signed)
All rxs were erx on 01/24/10. Alfonzo Feller, [place] 01/29/2010, 1:25 PM

## 2010-01-30 ENCOUNTER — Encounter (INDEPENDENT_AMBULATORY_CARE_PROVIDER_SITE_OTHER): Payer: 59 | Admitting: RHEUMATOLOGY

## 2010-02-03 ENCOUNTER — Telehealth (INDEPENDENT_AMBULATORY_CARE_PROVIDER_SITE_OTHER): Payer: Self-pay

## 2010-02-03 MED ORDER — FENOFIBRATE MICRONIZED 67 MG CAPSULE
67.00 mg | ORAL_CAPSULE | Freq: Every morning | ORAL | Status: DC
Start: 2010-02-03 — End: 2010-09-09

## 2010-02-03 MED ORDER — SIMVASTATIN 40 MG TABLET
40.00 mg | ORAL_TABLET | Freq: Every evening | ORAL | Status: DC
Start: 2010-02-03 — End: 2010-05-22

## 2010-02-03 MED ORDER — METOPROLOL SUCCINATE ER 50 MG TABLET,EXTENDED RELEASE 24 HR
50.0000 mg | ORAL_TABLET | Freq: Every day | ORAL | Status: DC
Start: 2010-02-03 — End: 2017-12-06

## 2010-02-03 MED ORDER — AMLODIPINE 10 MG TABLET
10.0000 mg | ORAL_TABLET | Freq: Every day | ORAL | Status: DC
Start: 2010-02-03 — End: 2010-11-24

## 2010-02-03 MED ORDER — METFORMIN ER 500 MG TABLET,EXTENDED RELEASE 24 HR
1000.0000 mg | ORAL_TABLET | Freq: Every day | ORAL | Status: DC
Start: 2010-02-03 — End: 2010-10-16

## 2010-02-03 MED ORDER — MELOXICAM 15 MG TABLET
15.0000 mg | ORAL_TABLET | Freq: Every day | ORAL | Status: DC
Start: 2010-02-03 — End: 2010-09-09

## 2010-02-03 MED ORDER — PIOGLITAZONE 30 MG TABLET
30.0000 mg | ORAL_TABLET | Freq: Every day | ORAL | Status: DC
Start: 2010-02-03 — End: 2016-10-23

## 2010-02-03 NOTE — Telephone Encounter (Signed)
Chase [name], wife called and states that pt asked for Rx a few weeks ago but the pharmacy states that they didn't receive it.  Called and spoke with [facility] whom states that they didn't receive Rx's on the 10th.  They received the ones for strips an lancets on the 9th.  [name], LPN 1/91/4782, 9:56 PM

## 2010-03-26 IMAGING — CT CT PELVIS W/O CM
2 of 4 series · 17 of 46 positions shown, 19 images · non-contrast
Comparison: Report of prior CT scan dated 04/14/2002

CT ABDOMEN

CLINICAL DATA: Left flank pain.  History of to prior kidney
stones.

CT OF THE ABDOMEN AND PELVIS WITHOUT CONTRAST (CT UROGRAM)
TECHNIQUE: Multidetector CT imaging was performed through the
abdomen and pelvis to include the urinary tract.

[Series 2: renal stone 2.0 coronal · coronal · 0.81mm/px · 3 of 185 slices shown]
[im 62/185  soft-tissue]
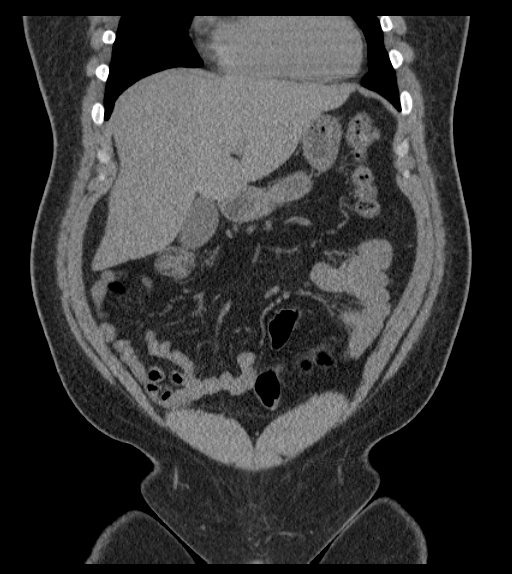
[im 82/185  soft-tissue]
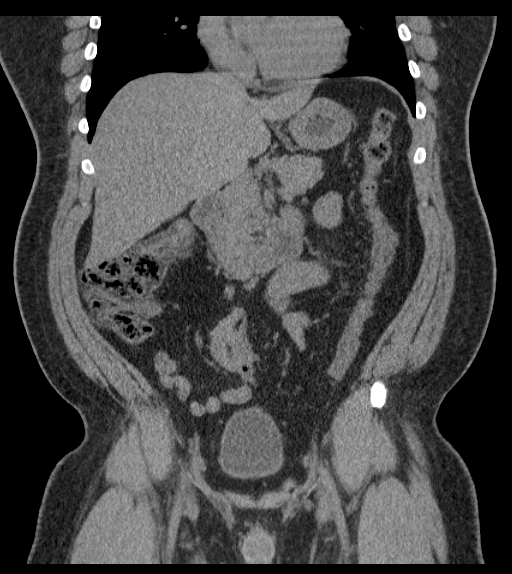
[im 103/185  soft-tissue]
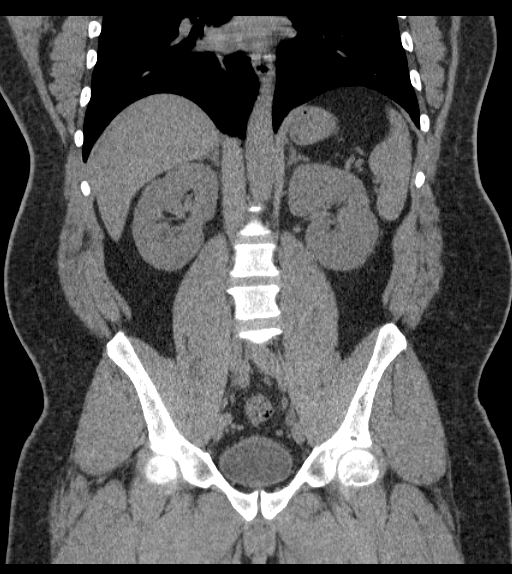

[Series 5: renal stone > 200 lbs 5.0 b31f · axial · 0.81mm/px · z∈[-443,-38]mm · 14 of 89 slices shown, 16 images]
[im 4/89  soft-tissue]
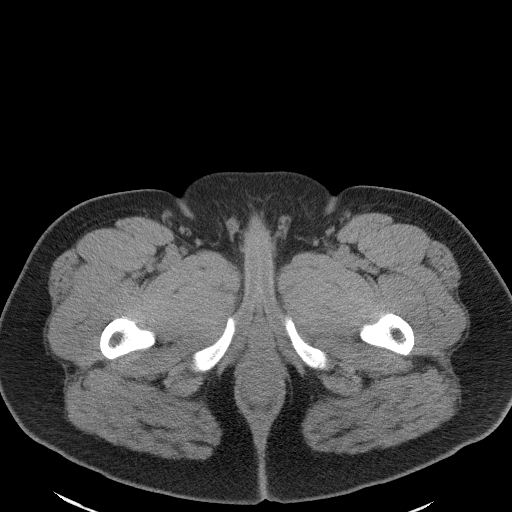
[im 4/89  bone]
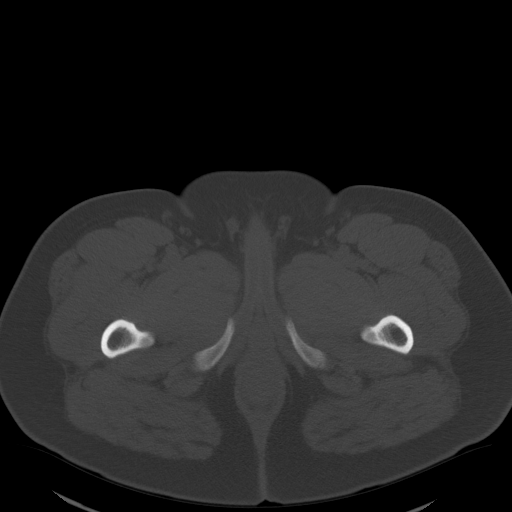
[im 12/89  soft-tissue]
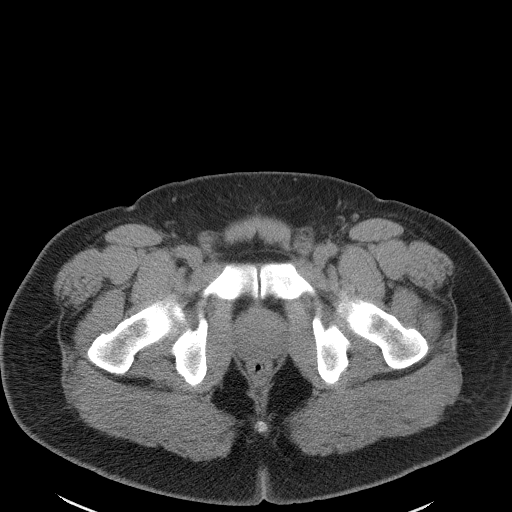
[im 16/89  soft-tissue]
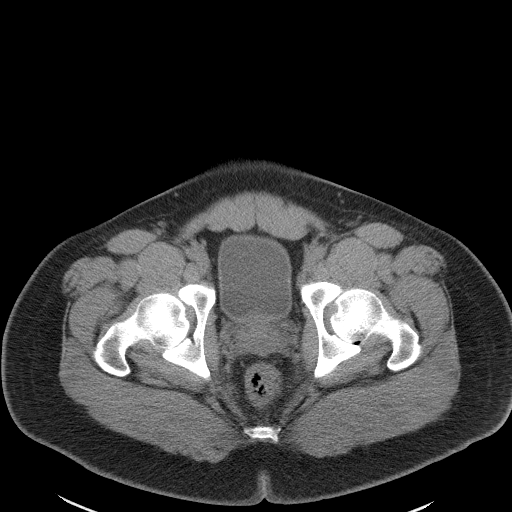
[im 23/89  soft-tissue]
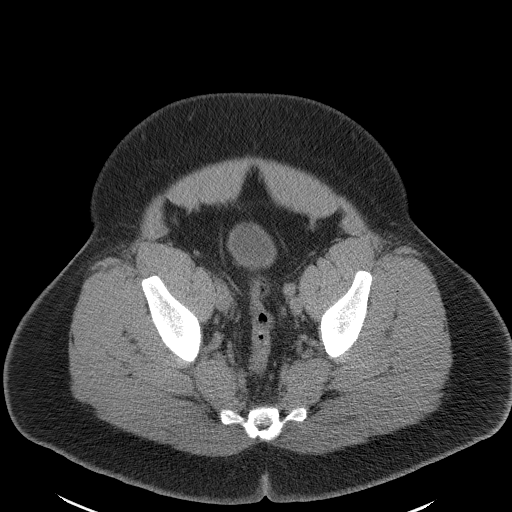
[im 31/89  soft-tissue]
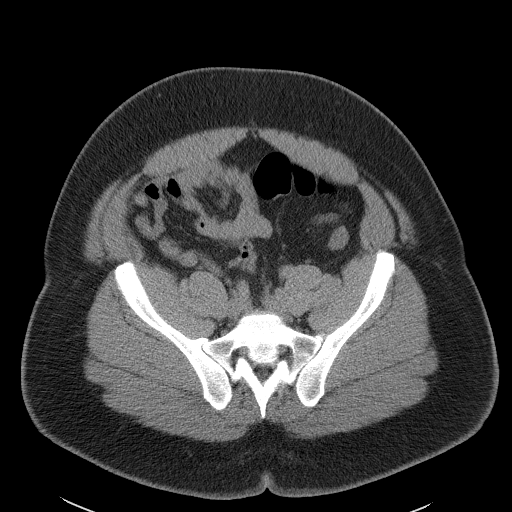
[im 35/89  soft-tissue]
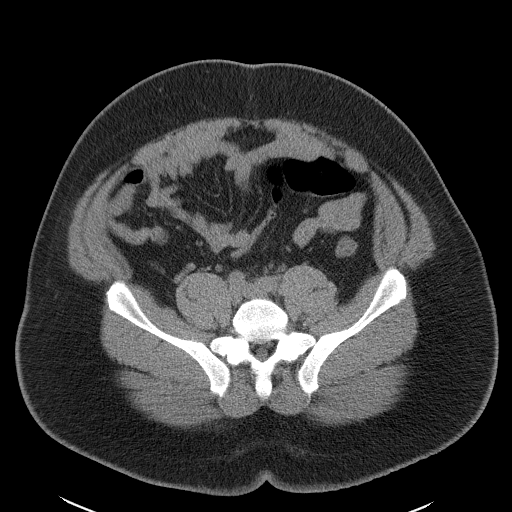
[im 43/89  soft-tissue]
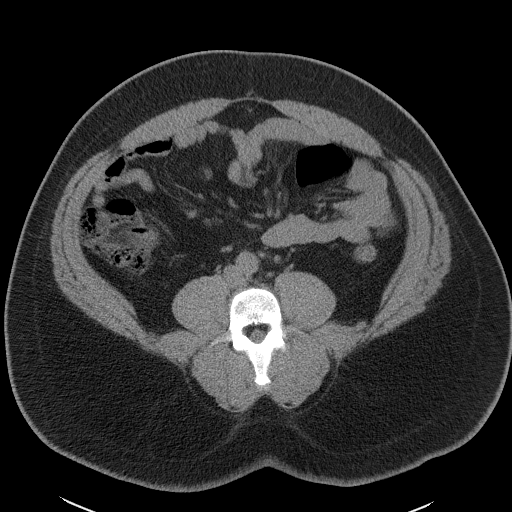
[im 46/89  soft-tissue]
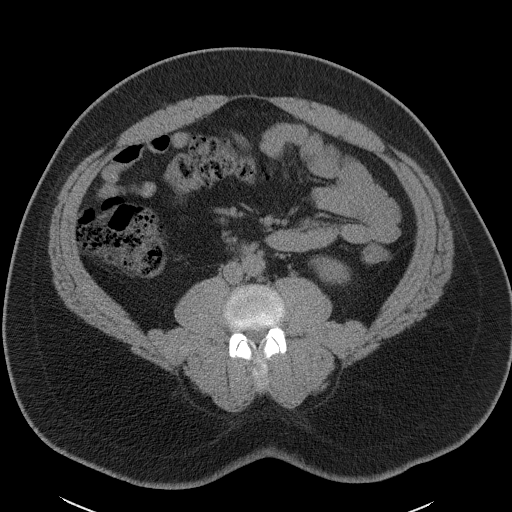
[im 54/89  soft-tissue]
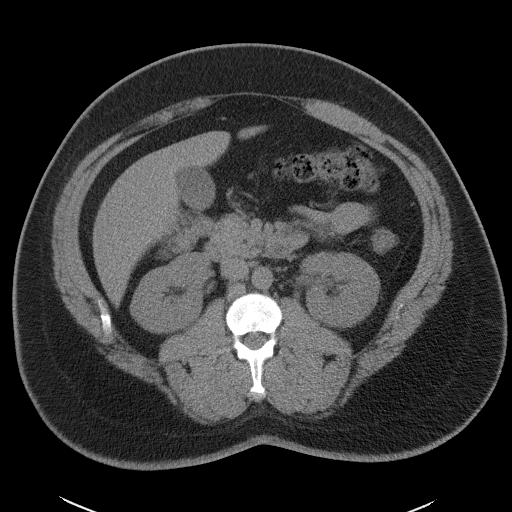
[im 54/89  bone]
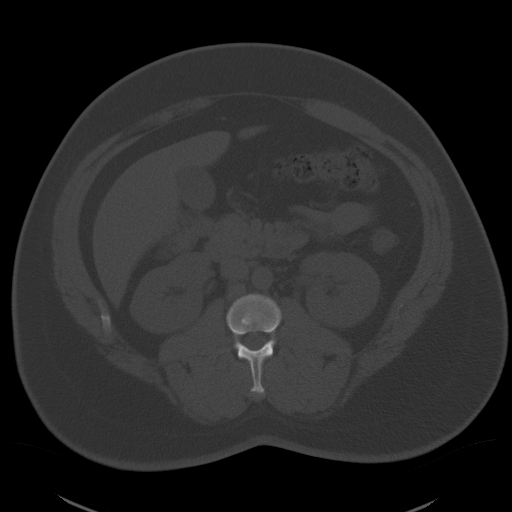
[im 58/89  soft-tissue]
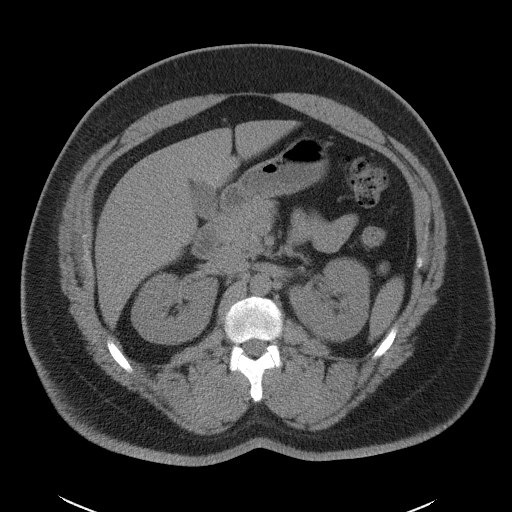
[im 66/89  soft-tissue]
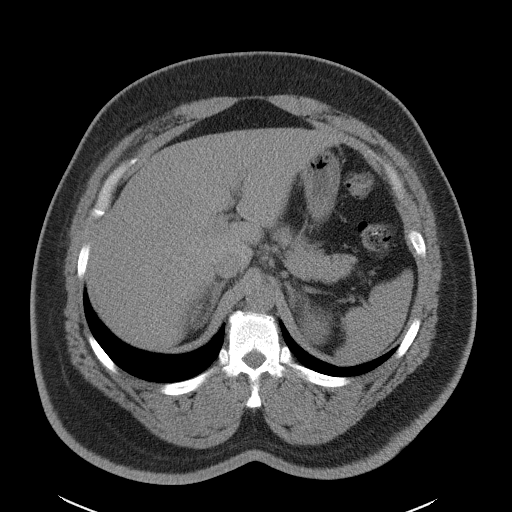
[im 73/89  soft-tissue]
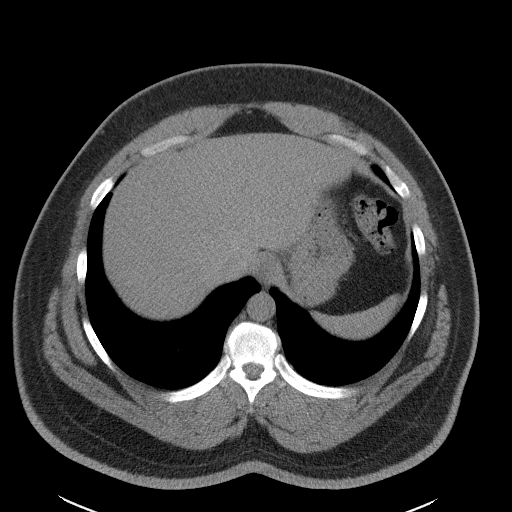
[im 77/89  soft-tissue]
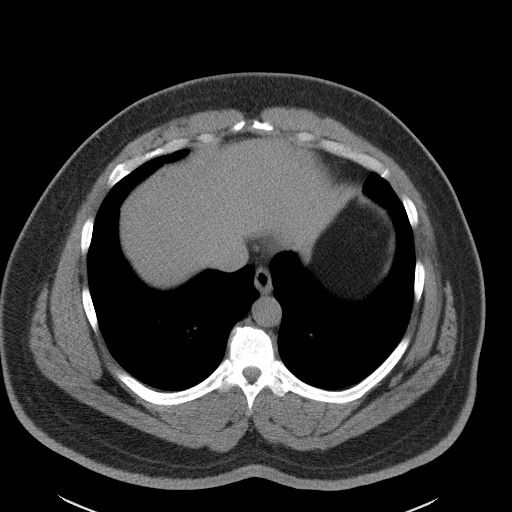
[im 85/89  soft-tissue]
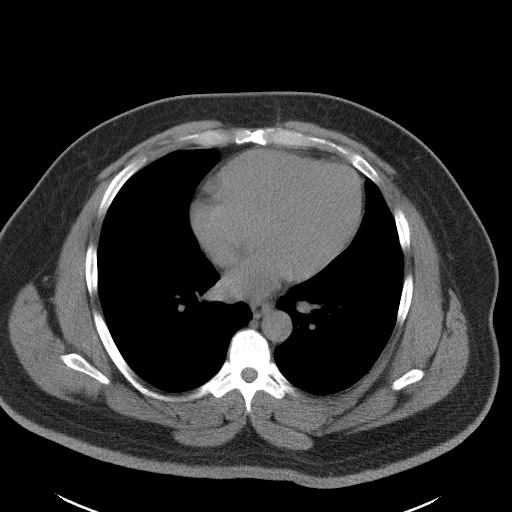

[17 of 46 positions shown; findings below may reference images not displayed]

FINDINGS: There is minimal distention of the left ureter into the
pelvis.  There is a 2 mm stone in the midportion of the left
kidney.  There are no right renal calculi.

The unenhanced liver, spleen, pancreas, and adrenal glands are
normal.  The bowel is normal.  No bony abnormalities.
IMPRESSION: Slight distention of the left ureter.  Tiny left renal stone.

CT PELVIS
FINDINGS: The left ureter is slightly dilated to the bladder.
There is no evidence of a ureteral or bladder stone.  I suspect the
patient has recently passed a stone.

The terminal ileum and appendix appear normal.  There is no free
fluid or adenopathy or other abnormality.  No significant bony
abnormality.
IMPRESSION: Slight dilatation of the left ureter without evidence of a stone.
I suspect the patient has recently passed a left ureteral stone.

## 2010-04-22 ENCOUNTER — Ambulatory Visit
Admission: RE | Admit: 2010-04-22 | Discharge: 2010-04-22 | Disposition: A | Discharge status: Home / Self Care | Payer: 59 | Attending: RHEUMATOLOGY | Admitting: RHEUMATOLOGY

## 2010-04-22 ENCOUNTER — Ambulatory Visit (INDEPENDENT_AMBULATORY_CARE_PROVIDER_SITE_OTHER): Payer: 59 | Admitting: RHEUMATOLOGY

## 2010-04-22 ENCOUNTER — Encounter (INDEPENDENT_AMBULATORY_CARE_PROVIDER_SITE_OTHER): Payer: Self-pay | Admitting: RHEUMATOLOGY

## 2010-04-22 VITALS — BP 150/100 | HR 88 | Temp 97.6°F | Ht 70.0 in | Wt 222.0 lb

## 2010-04-22 DIAGNOSIS — L4050 Arthropathic psoriasis, unspecified: Secondary | ICD-10-CM | POA: Insufficient documentation

## 2010-04-22 LAB — CBC/DIFF
BASOPHILS: 1 % (ref 0–1)
BASOS ABS: 0.061 THOU/uL (ref 0.0–0.2)
EOS ABS: 0.164 THOU/uL (ref 0.1–0.3)
EOSINOPHIL: 3 % (ref 1–6)
HCT: 47 % (ref 39.8–50.2)
HGB: 16.5 g/dL (ref 13.1–17.3)
LYMPHOCYTES: 32 % (ref 20–45)
LYMPHS ABS: 1.78 THOU/uL (ref 1.0–4.8)
MCH: 30.3 pg (ref 27.4–33.0)
MCHC: 35.2 g/dL (ref 31.6–35.5)
MCV: 86.1 fL (ref 82.0–99.0)
MONOCYTES: 8 % (ref 4–13)
MONOS ABS: 0.438 THOU/uL (ref 0.1–0.9)
MPV: 7.2 FL — ABNORMAL LOW (ref 7.4–10.4)
NRBC'S: 0 /100{WBCs}
PLATELET COUNT: 277 THOU/uL (ref 140–450)
PMN ABS: 3.2 THOU/uL (ref 1.5–7.7)
PMN'S: 56 % (ref 40–75)
RBC: 5.46 MIL/uL (ref 4.46–5.70)
RDW: 12.6 % (ref 10.2–14.0)
WBC: 5.6 THOU/uL (ref 3.5–11.0)

## 2010-04-22 LAB — CREATININE WITH EGFR
CREATININE: 1.25 mg/dL (ref 0.62–1.27)
ESTIMATED GLOMERULAR FILTRATION RATE: >59 ml/min/1.73m2 (ref >59)

## 2010-04-22 LAB — AST (SGOT): AST (SGOT): 37 U/L (ref 8–48)

## 2010-04-22 NOTE — Progress Notes (Signed)
Subjective  Chase [name] is a 44 y.o. year old male who presents for No chief complaint on file.   to clinic.  Doing ok. Same meds. No med problems.    Current outpatient prescriptions   Medication Sig   . Simvastatin (ZOCOR) 40 mg Tab take 1 Tab by mouth QPM.    . Metformin (GLUCOPHAGE XR) 500 mg Tb24 take 2 Tabs by mouth Once a day.    . Fenofibrate Micronized (LOFIBRA) 67 mg Cap take 1 Cap by mouth Daily with Breakfast.    . Meloxicam (MOBIC) 15 mg Tab take 1 Tab by mouth Once a day.    . metoprolol succinate (TOPROL-XL) 50 mg Tb24 take 1 Tab by mouth Once a day.    Marland Kitchen amlodipine (NORVASC) 10 mg Tab take 1 Tab by mouth Once a day.    . pioglitazone (ACTOS) 30 mg Tab take 1 Tab by mouth Once a day.    . enalapril (VASOTEC) 20 mg Tab take 1 Tab by mouth Once a day.    . Etanercept 25 mg Kit 50 mg by Subcutaneous route Q7 Days.    . Calcipotriene 0.005 % Oint by Topical route Twice daily.    . Blood Sugar Diagnostic (FREESTYLE LITE STRIPS) Strp 1 Stick by Percutaneous route Three times a day.    . lancet (diabetic supply) Misc 1 Each by Does not apply route Per Instructions.          Objective  Vitals: BP 150/100   Pulse 88   Temp(Src) 36.4 C (97.6 F) (Tympanic)   Ht 1.778 m (5\' 10" )   Wt 100.699 kg (222 lb)   BMI 31.85 kg/m2   SpO2 96%  General:  no acute distress  Eyes: Conjunctiva clear., Pupils equal and round.   Joints:  Not much synovitis,  Skin: psoriasis mild on his knees  Neurologic: No weakness, tremor, or atrophy across any joint  Psychiatric: normal affect, behavior, memory, thought content, judgement, and speech.  Assessment/Plan  No diagnosis found.    No orders of the defined types were placed in this encounter.   about same psor arthritis  Labs cont same rtc 3-4 mos    Evalina Field, MD 04/22/2010, 2:55 PM

## 2010-04-29 ENCOUNTER — Encounter (INDEPENDENT_AMBULATORY_CARE_PROVIDER_SITE_OTHER): Payer: PRIVATE HEALTH INSURANCE | Admitting: Family Medicine

## 2010-05-14 ENCOUNTER — Telehealth (INDEPENDENT_AMBULATORY_CARE_PROVIDER_SITE_OTHER): Payer: Self-pay

## 2010-05-14 NOTE — Telephone Encounter (Signed)
Called and notified patient that he needs to be seen.  Appointment made for 05/20/2010 @3 :00pmAlfredia Client Glendia Olshefski, LPN 1/61/0960, 45:40 AM

## 2010-05-14 NOTE — Telephone Encounter (Signed)
He no showed for his last appt. And did not reschedule. Needs appt. For reg f/u and can discuss med change at that time

## 2010-05-14 NOTE — Telephone Encounter (Signed)
Patient came by office with a script from Dr. Daryl Eastern concerning medication.  Script is asking for a non B-blocker for blood pressure control.  The B-blocker will worsen his psoriasis.  Patient is asking if this can be done and if so would like to have it called into the [facility].  Alfredia Client Meliya Mcconahy, LPN 1/61/0960, 4:54 AM

## 2010-05-15 ENCOUNTER — Encounter (INDEPENDENT_AMBULATORY_CARE_PROVIDER_SITE_OTHER): Payer: Self-pay

## 2010-05-20 ENCOUNTER — Encounter (INDEPENDENT_AMBULATORY_CARE_PROVIDER_SITE_OTHER): Payer: Self-pay | Admitting: Family Medicine

## 2010-05-22 ENCOUNTER — Encounter (INDEPENDENT_AMBULATORY_CARE_PROVIDER_SITE_OTHER): Payer: Self-pay | Admitting: Family Medicine

## 2010-05-22 ENCOUNTER — Ambulatory Visit (INDEPENDENT_AMBULATORY_CARE_PROVIDER_SITE_OTHER): Payer: PRIVATE HEALTH INSURANCE | Admitting: Family Medicine

## 2010-05-22 VITALS — BP 112/78 | HR 60 | Temp 96.6°F | Resp 18 | Ht 69.0 in | Wt 217.6 lb

## 2010-05-22 LAB — POCT HGB A1C: HEMOGLOBIN A1C: 7.7

## 2010-05-22 MED ORDER — ATORVASTATIN 20 MG TABLET
20.00 mg | ORAL_TABLET | Freq: Every evening | ORAL | Status: DC
Start: 2010-05-22 — End: 2010-09-09

## 2010-05-22 NOTE — Progress Notes (Signed)
Subjective:     Patient ID:  Chase [name] is an 44 y.o. male     Chief Complaint:  Chief Complaint   Patient presents with   . Follow-up   . Discussion Of Issues     medications         HPI  Patient here today for routine follow up. has a past medical history of Hypertension; GERD (gastroesophageal reflux disease); Urinary calculus, unspecified; Arthritis with psoriasis; Fracture of distal end of tibia; Fracture of distal fibula; CAD (coronary artery disease); and Diabetes mellitus, type 2 (03/19/2009).Says BS have been doing better as long as he remembers to eat.  Has been having more problems with his psoriasis and his dermatologist wanted to see if he could change his beta blocker.  Is on Zocor and Amlodipine.      The patient's past medical, social and family histories were reviewed as well as current medications and allergies.  Changes were made in the chart where indicated.       Review of Systems   Constitutional: Negative for fever and chills.   HENT: Negative for sore throat.    Eyes: Negative for blurred vision and double vision.   Respiratory: Negative for cough, shortness of breath and wheezing.    Cardiovascular: Negative for chest pain, palpitations, orthopnea and leg swelling.   Gastrointestinal: Negative for nausea, vomiting, abdominal pain and diarrhea.   Genitourinary: Negative for dysuria, urgency and frequency.   Musculoskeletal: Negative for myalgias.   Skin: Negative for rash.   Neurological: Negative for dizziness, tremors, seizures, weakness and headaches.   Psychiatric/Behavioral: Negative for depression. The patient is not nervous/anxious and does not have insomnia.        Objective:       BP 112/78   Pulse 60   Temp(Src) 35.9 C (96.6 F) (Tympanic)   Resp 18   Ht 1.753 m (5\' 9" )   Wt 98.703 kg (217 lb 9.6 oz)   BMI 32.13 kg/m2   SpO2 97%    Body mass index is 32.13 kg/(m^2).    Physical Exam   Nursing note and vitals reviewed.   Constitutional: He is oriented to person, place, and time and well-developed, well-nourished, and in no distress. No distress.   HENT:   Head: Normocephalic and atraumatic.   Eyes: Conjunctivae are normal. Pupils are equal, round, and reactive to light. Right eye exhibits no discharge. Left eye exhibits no discharge.   Neck: Normal range of motion. Neck supple. No JVD present. Carotid bruit is not present. No rigidity. No tracheal deviation present. No thyromegaly present.        No carotid bruits   Cardiovascular: Normal rate, regular rhythm, normal heart sounds and intact distal pulses.  Exam reveals no gallop and no friction rub.    No murmur heard.  Pulmonary/Chest: Breath sounds normal. No respiratory distress. He has no wheezes. He has no rhonchi. He has no rales.   Abdominal: Soft. Bowel sounds are normal. He exhibits no distension and no mass. There is no hepatosplenomegaly, splenomegaly or hepatomegaly. No tenderness. He has no rebound and no guarding.        obese   Musculoskeletal: He exhibits no edema and no tenderness.   Lymphadenopathy:     He has no cervical adenopathy.   Neurological: He is alert and oriented to person, place, and time. Gait normal. Coordination normal.   Skin: Skin is warm and dry. No rash noted. He is not diaphoretic.   Psychiatric:  Mood, affect and judgment normal.         Ortho/Musculoskeletal:   He exhibits no edema and no tenderness.       Assessment & Plan:     1. Diabetes mellitus, type 2 (250.00BE)  POCT HGB A1C   2. Psoriatic Arthritis (696.0)     3. Hypertension (401.9AJ)     4. GERD (gastroesophageal reflux disease) (530.81S)     5. CAD (coronary artery disease) (414.00AE)     6. Other and unspecified hyperlipidemia (272.4)         Office Visit on 05/22/2010   Component Date Value Range Status   . HEMOGLOBIN A1C  05/22/2010 7.7%   Final   Hospital Outpatient Visit on 04/22/2010   Component Date Value Range Status   . WBC (THOU/uL) 04/22/2010 5.6  3.5-11.0 Final    . RBC (MIL/uL) 04/22/2010 5.46  4.46-5.70 Final   . HGB (g/dL) 16/08/9603 54.0  98.1-19.1 Final   . HCT (%) 04/22/2010 47.0  39.8-50.2 Final   . MCV (fL) 04/22/2010 86.1  82.0-99.0 Final   . MCH (pg) 04/22/2010 30.3  27.4-33.0 Final   . MCHC (g/dL) 47/82/9562 13.0  86.5-78.4 Final   . RDW (%) 04/22/2010 12.6  10.2-14.0 Final   . PLATELET COUNT (THOU/uL) 04/22/2010 277  140-450 Final   . MPV (FL) 04/22/2010 7.2* 7.4-10.4 Final   . PMN'S (%) 04/22/2010 56  40-75 Final   . PMN ABS (THOU/uL) 04/22/2010 3.200  1.5-7.7 Final   . LYMPHOCYTES (%) 04/22/2010 32  20-45 Final   . LYMPHS ABS (THOU/uL) 04/22/2010 1.780  1.0-4.8 Final   . MONOCYTES (%) 04/22/2010 8  4-13 Final   . MONOS ABS (THOU/uL) 04/22/2010 0.438  0.1-0.9 Final   . EOSINOPHIL (%) 04/22/2010 3  1-6 Final   . EOS ABS (THOU/uL) 04/22/2010 0.164  0.1-0.3 Final   . BASOPHILS (%) 04/22/2010 1  0-1 Final   . BASOS ABS (THOU/uL) 04/22/2010 0.061  0.0-0.2 Final   . NRBC'S (/100WBC) 04/22/2010 0  0 Final   . CREATININE (mg/dL) 69/62/9528 4.13  2.44-0.10 Final    Comment: SPECIMEN SLIGHTLY HEMOLYZED - RESULT(S) MAY BE AFFECTED                           LIPEMIC SPECIMEN - RESULT(S) MAY BE AFFECTED   . ESTIMATED GLOMERULAR FILTRATION RA* (ml/min/1.64m2) 04/22/2010 >59  >59 Final    Comment: IF PATIENT IS [employer] MULTIPLY RESULT BY 1.210                           (NOTE)                           Chronic Kidney Disease Stages based on GFR                           Stage 1   Normal GFR                           Stage 2   60 to 89                           Stage 3   30 to 59  Stage 4   15 to 29                           Stage 5   <15   . AST (SGOT) (U/L) 04/22/2010 37  8-48 Final    Comment: SPECIMEN SLIGHTLY HEMOLYZED - RESULT(S) MAY BE AFFECTED                           LIPEMIC SPECIMEN - RESULT(S) MAY BE AFFECTED   Office Visit on 01/23/2010   Component Date Value Range Status   . HEMOGLOBIN A1C  01/23/2010 7.6%   Final        Continue current treatment regimen. Patient is doing well. Patient is to call with any problems prior to next apppointment if needed.    Would prefer to keep him on Toprol if possible.  He will discuss with his rheumatologist.      Will change Zocor to Lipitor.     He will return in 2 weeks for fasting labs.      Advised him we should add Januvia but he does not want any more meds. He will try to control his diet better.

## 2010-06-05 ENCOUNTER — Ambulatory Visit (INDEPENDENT_AMBULATORY_CARE_PROVIDER_SITE_OTHER): Payer: PRIVATE HEALTH INSURANCE | Admitting: Clinical Medical Laboratory

## 2010-06-06 ENCOUNTER — Other Ambulatory Visit (INDEPENDENT_AMBULATORY_CARE_PROVIDER_SITE_OTHER): Payer: Self-pay | Admitting: Clinical Medical Laboratory

## 2010-06-06 ENCOUNTER — Encounter (INDEPENDENT_AMBULATORY_CARE_PROVIDER_SITE_OTHER): Payer: Self-pay | Admitting: Family Medicine

## 2010-06-09 ENCOUNTER — Other Ambulatory Visit (INDEPENDENT_AMBULATORY_CARE_PROVIDER_SITE_OTHER): Payer: Self-pay

## 2010-06-13 NOTE — Progress Notes (Signed)
Pt had labs done at Dr. Paulina Fusi' lab. Alfonzo Feller, [place] 06/13/2010, 7:37 AM

## 2010-07-10 ENCOUNTER — Encounter (INDEPENDENT_AMBULATORY_CARE_PROVIDER_SITE_OTHER): Payer: Self-pay | Admitting: Family Medicine

## 2010-07-10 ENCOUNTER — Ambulatory Visit (INDEPENDENT_AMBULATORY_CARE_PROVIDER_SITE_OTHER): Payer: PRIVATE HEALTH INSURANCE | Admitting: Family Medicine

## 2010-07-10 ENCOUNTER — Other Ambulatory Visit (INDEPENDENT_AMBULATORY_CARE_PROVIDER_SITE_OTHER): Payer: Self-pay

## 2010-07-10 VITALS — BP 100/72 | HR 54 | Temp 96.9°F | Resp 16 | Ht 69.0 in | Wt 217.0 lb

## 2010-07-10 NOTE — Progress Notes (Signed)
Subjective:     Patient ID:  Chase [name] is an 44 y.o. male     Chief Complaint:  Chief Complaint   Patient presents with   . Follow-up After Testing     stress test         HPI  Patient here for ER f/u. Was seen at [facility]. ER 07/03/10 for chest pain while cutting his grass. He had a stress test which showed no evidence of infarct or ischemia.Marland Kitchen Has had another episode of Cp , but has not taken any NTG.  Had a heart cath about 2 years ago. At [facility]. General and it showed some mild disease, per the patient.     The patient's past medical, social and family histories were reviewed as well as current medications and allergies.  Changes were made in the chart where indicated.       ROS    Objective:     BP 100/72   Pulse 54   Temp(Src) 36.1 C (96.9 F) (Tympanic)   Resp 16   Ht 1.753 m (5\' 9" )   Wt 98.431 kg (217 lb)   BMI 32.05 kg/m2   SpO2 99%    Body mass index is 32.05 kg/(m^2).    Physical Exam   Nursing note and vitals reviewed.  Constitutional: He is oriented to person, place, and time. No distress.   HENT:   Head: Normocephalic and atraumatic.   Eyes: Conjunctivae are normal.   Neck: Normal range of motion. Neck supple. No JVD present. No tracheal deviation present. No thyromegaly present.        No carotid bruits   Cardiovascular: Normal rate and regular rhythm.  Exam reveals no gallop and no friction rub.    No murmur heard.  Pulmonary/Chest: Breath sounds normal. No respiratory distress. He has no wheezes. He has no rales.   Abdominal: Soft. Bowel sounds are normal. He exhibits no distension. No tenderness.   Musculoskeletal: Normal range of motion. He exhibits no edema.   Lymphadenopathy:     He has no cervical adenopathy.   Neurological: He is alert and oriented to person, place, and time.   Skin: Skin is warm and dry. He is not diaphoretic.   Psychiatric: Mood and affect normal.         Ortho/Musculoskeletal:   Normal range of motion. He exhibits no edema.       Assessment & Plan:      1. CAD (coronary artery disease) (414.00AE)          Will refer to cardiology ASAP as he is having CP with exertion and has a known h/o CAD, HTN, Hyperlipidemia and DM 2 .  May need another heart cath, I explained to him how and when to takr NTG prn and advised him that stress tests are not 100 % accurate and if her has recurrence of chest pain, he should get immediate evaluation. He and his wife understand.

## 2010-07-24 ENCOUNTER — Encounter (INDEPENDENT_AMBULATORY_CARE_PROVIDER_SITE_OTHER): Payer: 59 | Admitting: RHEUMATOLOGY

## 2010-09-09 ENCOUNTER — Encounter (INDEPENDENT_AMBULATORY_CARE_PROVIDER_SITE_OTHER): Payer: Self-pay | Admitting: RHEUMATOLOGY

## 2010-09-09 ENCOUNTER — Ambulatory Visit (INDEPENDENT_AMBULATORY_CARE_PROVIDER_SITE_OTHER): Payer: 59 | Admitting: RHEUMATOLOGY

## 2010-09-09 ENCOUNTER — Ambulatory Visit
Admission: RE | Admit: 2010-09-09 | Discharge: 2010-09-09 | Disposition: A | Discharge status: Home / Self Care | Payer: 59 | Attending: RHEUMATOLOGY | Admitting: RHEUMATOLOGY

## 2010-09-09 VITALS — BP 164/102 | HR 73 | Temp 97.2°F | Ht 61.2 in | Wt 222.8 lb

## 2010-09-09 DIAGNOSIS — L4050 Arthropathic psoriasis, unspecified: Secondary | ICD-10-CM | POA: Insufficient documentation

## 2010-09-09 LAB — CREATININE WITH EGFR
CREATININE: 1.14 mg/dL (ref 0.62–1.27)
ESTIMATED GLOMERULAR FILTRATION RATE: >59 ml/min/1.73m2 (ref >59)

## 2010-09-09 LAB — AST (SGOT): AST (SGOT): 34 U/L (ref 8–48)

## 2010-09-09 LAB — CBC
HCT: 45.1 % (ref 39.8–50.2)
HGB: 15.6 g/dL (ref 13.1–17.3)
MCH: 30.2 pg (ref 27.4–33.0)
MCHC: 34.6 g/dL (ref 31.6–35.5)
MCV: 87.4 fL (ref 82.0–99.0)
MPV: 7.2 FL — ABNORMAL LOW (ref 7.4–10.4)
PLATELET COUNT: 273 THOU/uL (ref 140–450)
RBC: 5.17 MIL/uL (ref 4.46–5.70)
RDW: 12.9 % (ref 10.2–14.0)
WBC: 6.7 THOU/uL (ref 3.5–11.0)

## 2010-09-09 NOTE — Progress Notes (Signed)
Subjective  Chase [name] is a 44 y.o. year old male who presents for Follow-up   to clinic.  psor arthritis.  Skin problem coming back..  Hurting more.  Stiff 2 hrs now.  Pain all day.  Never had MTX.   Likes beer.  No AZA either.  Current outpatient prescriptions   Medication Sig   . Metformin (GLUCOPHAGE XR) 500 mg Tb24 take 2 Tabs by mouth Once a day.    . metoprolol succinate (TOPROL-XL) 50 mg Tb24 take 1 Tab by mouth Once a day.    Marland Kitchen amlodipine (NORVASC) 10 mg Tab take 1 Tab by mouth Once a day.    . enalapril (VASOTEC) 20 mg Tab take 1 Tab by mouth Once a day.    . Etanercept 25 mg Kit 50 mg by Subcutaneous route Q7 Days.    . Calcipotriene 0.005 % Oint by Topical route Twice daily.    . Blood Sugar Diagnostic (FREESTYLE LITE STRIPS) Strp 1 Stick by Percutaneous route Three times a day.    . lancet (diabetic supply) Misc 1 Each by Does not apply route Per Instructions.    Marland Kitchen DISCONTD: atorvastatin (LIPITOR) 20 mg Tab take 1 Tab by mouth QPM.   . pioglitazone (ACTOS) 30 mg Tab take 1 Tab by mouth Once a day.    Marland Kitchen DISCONTD: Fenofibrate Micronized (LOFIBRA) 67 mg Cap take 1 Cap by mouth Daily with Breakfast.    . DISCONTD: Meloxicam (MOBIC) 15 mg Tab take 1 Tab by mouth Once a day.          Objective  Vitals: BP 164/102   Pulse 73   Temp(Src) 36.2 C (97.2 F) (Tympanic)   Ht 1.554 m (5' 1.2")   Wt 101.061 kg (222 lb 12.8 oz)   BMI 41.82 kg/m2   SpO2 97%  Eyes: Conjunctiva clear., Pupils equal and round.   Joints:  Not synovitis  Skin: psoriasis both knees.  Neurologic: Up to exam table without assistance, Proximal muscle strength nl  Psychiatric: normal affect, behavior, memory, thought content, judgement, and speech.  Assessment/Plan  No diagnosis found.    No orders of the defined types were placed in this encounter.     Dicussed humira vs adding aza.  wil add AZA 100 day.  Labs RTc 3 mos  Evalina Field, MD 09/09/2010, 2:26 PM

## 2010-09-09 NOTE — Progress Notes (Signed)
Administered 0.22mL flu shot to the left deltoid.Pt tolerated well with no reactions at this time Claudean Severance 09/09/2010, 3:07 PM

## 2010-10-16 ENCOUNTER — Other Ambulatory Visit (INDEPENDENT_AMBULATORY_CARE_PROVIDER_SITE_OTHER): Payer: Self-pay

## 2010-10-16 MED ORDER — METFORMIN ER 500 MG TABLET,EXTENDED RELEASE 24 HR
1000.0000 mg | ORAL_TABLET | Freq: Every day | ORAL | Status: DC
Start: 2010-10-16 — End: 2014-04-13

## 2010-10-31 ENCOUNTER — Encounter (INDEPENDENT_AMBULATORY_CARE_PROVIDER_SITE_OTHER): Payer: Self-pay | Admitting: Family Medicine

## 2010-10-31 ENCOUNTER — Ambulatory Visit (INDEPENDENT_AMBULATORY_CARE_PROVIDER_SITE_OTHER): Payer: 59 | Admitting: Family Medicine

## 2010-10-31 VITALS — BP 132/80 | HR 77 | Temp 97.1°F | Resp 18 | Ht 69.0 in | Wt 216.8 lb

## 2010-10-31 NOTE — Progress Notes (Signed)
Subjective:     Patient ID:  Chase [name] is an 44 y.o. male     Chief Complaint:  Chief Complaint   Patient presents with   . Back Pain         HPI  Patient here today for routine follow up. Has had no significant problems since last visit and is doing well. has a past medical history of Hypertension; GERD (gastroesophageal reflux disease); Urinary calculus, unspecified; Arthritis with psoriasis; Fracture of distal end of tibia; Fracture of distal fibula; CAD (coronary artery disease); and Diabetes mellitus, type 2 (03/19/2009).  Is having a lot of pain with his psoriatic arthritis and has been on Embrel for about 6 years. He does not feel that it is working any longer and would like to see a different Rheumatologist to discuss other treatment options.     The patient's past medical, social and family histories were reviewed as well as current medications and allergies.  Changes were made in the chart where indicated.     Review of Systems   Constitutional: Negative for fever and chills.   HENT: Negative for sore throat.    Eyes: Negative for blurred vision and double vision.   Respiratory: Negative for cough, shortness of breath and wheezing.    Cardiovascular: Negative for chest pain, palpitations, orthopnea and leg swelling.   Gastrointestinal: Negative for nausea, vomiting, abdominal pain and diarrhea.   Genitourinary: Negative for dysuria, urgency and frequency.   Musculoskeletal: Positive for back pain and joint pain. Negative for myalgias.   Skin: Negative for rash.   Neurological: Negative for dizziness, tremors, seizures, weakness and headaches.   Psychiatric/Behavioral: Negative for depression. The patient is not nervous/anxious and does not have insomnia.        Objective:       BP 132/80   Pulse 77   Temp(Src) 36.2 C (97.1 F) (Tympanic)   Resp 18   Ht 1.753 m (5\' 9" )   Wt 98.34 kg (216 lb 12.8 oz)   BMI 32.02 kg/m2   SpO2 98%    Body mass index is 32.02 kg/(m^2).    Physical Exam    Nursing note and vitals reviewed.  Constitutional: He is oriented to person, place, and time and well-developed, well-nourished, and in no distress. No distress.   HENT:   Head: Normocephalic and atraumatic.   Eyes: Conjunctivae are normal. Pupils are equal, round, and reactive to light. Right eye exhibits no discharge. Left eye exhibits no discharge.   Neck: Normal range of motion. Neck supple. No JVD present. Carotid bruit is not present. No rigidity. No tracheal deviation present. No thyromegaly present.        No carotid bruits   Cardiovascular: Normal rate, regular rhythm, normal heart sounds and intact distal pulses.  Exam reveals no gallop and no friction rub.    No murmur heard.  Pulmonary/Chest: Breath sounds normal. No respiratory distress. He has no wheezes. He has no rhonchi. He has no rales.   Abdominal: Soft. Bowel sounds are normal. He exhibits no distension and no mass. There is no hepatosplenomegaly, splenomegaly or hepatomegaly. No tenderness. He has no rebound and no guarding.        Overweight     Musculoskeletal: He exhibits no edema and no tenderness.   Lymphadenopathy:     He has no cervical adenopathy.   Neurological: He is alert and oriented to person, place, and time. Gait normal. Coordination normal.   Skin: Skin is warm and dry.  No rash noted. He is not diaphoretic.   Psychiatric: Mood, affect and judgment normal.         Ortho/Musculoskeletal:   He exhibits no edema and no tenderness.       Assessment & Plan:     1. Psoriatic Arthritis (696.0)  OUTSIDE CONSULT/REFERRAL PROVIDER(AMB)   2. Hypertension (401.9AJ)     3. GERD (gastroesophageal reflux disease) (530.81S)     4. CAD (coronary artery disease) (414.00AE)     5. Diabetes mellitus, type 2 (250.00BE)     6. Other and unspecified hyperlipidemia (272.4)           Continue current treatment regimen. Patient is doing well. Patient is to call with any problems prior to next apppointment if needed.    Is not fasting today for labs.      Will refer to Dr. Lytle Butte to see if she can offer any other treatment for P.A.      He says he needs PFT's done to see if he can safely wear a respirator.

## 2010-11-24 ENCOUNTER — Other Ambulatory Visit (INDEPENDENT_AMBULATORY_CARE_PROVIDER_SITE_OTHER): Payer: Self-pay

## 2010-11-24 MED ORDER — AMLODIPINE 10 MG TABLET
10.0000 mg | ORAL_TABLET | Freq: Every day | ORAL | Status: DC
Start: 2010-11-24 — End: 2017-04-07

## 2010-11-24 MED ORDER — ENALAPRIL MALEATE 20 MG TABLET
20.0000 mg | ORAL_TABLET | Freq: Every day | ORAL | Status: DC
Start: 2010-11-24 — End: 2017-05-27

## 2010-11-25 ENCOUNTER — Encounter (INDEPENDENT_AMBULATORY_CARE_PROVIDER_SITE_OTHER): Payer: Self-pay

## 2010-11-25 ENCOUNTER — Other Ambulatory Visit (INDEPENDENT_AMBULATORY_CARE_PROVIDER_SITE_OTHER): Payer: Self-pay

## 2010-12-03 ENCOUNTER — Encounter (INDEPENDENT_AMBULATORY_CARE_PROVIDER_SITE_OTHER): Payer: Self-pay

## 2010-12-07 ENCOUNTER — Encounter: Payer: Self-pay | Admitting: Family Medicine

## 2010-12-11 ENCOUNTER — Ambulatory Visit (INDEPENDENT_AMBULATORY_CARE_PROVIDER_SITE_OTHER): Payer: 59 | Admitting: RHEUMATOLOGY

## 2010-12-25 ENCOUNTER — Encounter (INDEPENDENT_AMBULATORY_CARE_PROVIDER_SITE_OTHER): Payer: Self-pay | Admitting: RHEUMATOLOGY

## 2010-12-25 ENCOUNTER — Ambulatory Visit (INDEPENDENT_AMBULATORY_CARE_PROVIDER_SITE_OTHER): Payer: 59 | Admitting: RHEUMATOLOGY

## 2010-12-25 ENCOUNTER — Ambulatory Visit
Admission: RE | Admit: 2010-12-25 | Discharge: 2010-12-25 | Disposition: A | Discharge status: Home / Self Care | Payer: 59 | Source: Ambulatory Visit | Attending: RHEUMATOLOGY | Admitting: RHEUMATOLOGY

## 2010-12-25 VITALS — BP 130/90 | HR 90 | Temp 97.4°F | Ht 70.0 in | Wt 210.4 lb

## 2010-12-25 NOTE — Progress Notes (Signed)
Subjective  Chase [name] is a 45 y.o. year old male who presents for F/U   to clinic. Psoriatic arthritis. Dons same pain slowy getting worse. AZA is no help.  Back stiff all day.  Other joints worse too. Working.  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    Current outpatient prescriptions   Medication Sig   . D5W SolP 100 mL with azathioprine 100 mg SolR by Intravenous route every 24 hours.   Marland Kitchen azathioprine (IMURAN) 50 mg Oral Tablet take 50 mg by mouth Twice daily.   Marland Kitchen amlodipine (NORVASC) 10 mg Oral Tablet take 1 Tab by mouth Once a day.   . enalapril (VASOTEC) 20 mg Oral Tablet take 1 Tab by mouth Once a day.   . Metformin (GLUCOPHAGE XR) 500 mg Oral Tablet Sustained Release 24 hr take 2 Tabs by mouth Once a day.   . metoprolol succinate (TOPROL-XL) 50 mg Tb24 take 1 Tab by mouth Once a day.    . pioglitazone (ACTOS) 30 mg Tab take 1 Tab by mouth Once a day.    . Etanercept 25 mg Kit 50 mg by Subcutaneous route Q7 Days.    . Calcipotriene 0.005 % Oint by Topical route Twice daily.    . Blood Sugar Diagnostic (FREESTYLE LITE STRIPS) Strp 1 Stick by Percutaneous route Three times a day.    . lancet (diabetic supply) Misc 1 Each by Does not apply route Per Instructions.          Objective  Vitals: BP 130/90   Pulse 90   Temp(Src) 36.3 C (97.4 F) (Tympanic)   Ht 1.778 m (5\' 10" )   Wt 95.437 kg (210 lb 6.4 oz)   BMI 30.19 kg/m2   SpO2 99%  Eyes: Conjunctiva clear., Pupils equal and round.   Lungs: no wheeze  Extremities: nails pitted  Joints:  Not much synovitis now but does limp  Skin: psoriasis  Neurologic: No weakness, tremor, or atrophy across any joint  Psychiatric: normal affect, behavior, memory, thought content, judgement, and speech.  Assessment/Plan  No diagnosis found.    No orders of the defined types were placed in this encounter.     He is miserable but about same on exam.  Labs RTC 3 mos  Evalina Field, MD 12/25/2010, 6:36 PM

## 2010-12-26 LAB — CBC/DIFF
BASOPHILS: 0 % (ref 0–1)
BASOS ABS: 0 THOU/uL (ref 0.0–0.2)
EOS ABS: 0 THOU/uL — ABNORMAL LOW (ref 0.1–0.3)
EOSINOPHIL: 0 % — ABNORMAL LOW (ref 1–6)
HCT: 48.9 % (ref 39.8–50.2)
HGB: 16.8 g/dL (ref 13.1–17.3)
LYMPHOCYTES: 23 % (ref 20–45)
LYMPHS ABS: 1.679 THOU/uL (ref 1.0–4.8)
MCH: 30 pg (ref 27.4–33.0)
MCHC: 34.4 g/dL (ref 31.6–35.5)
MCV: 87.2 fL (ref 82.0–99.0)
MONOCYTES: 11 % (ref 4–13)
MONOS ABS: 0.803 THOU/uL (ref 0.1–0.9)
MPV: 7.1 FL — ABNORMAL LOW (ref 7.4–10.4)
PLATELET COUNT: 293 THOU/uL (ref 140–450)
PMN ABS: 4.818 THOU/uL (ref 1.5–7.7)
PMN'S: 66 % (ref 40–75)
RBC: 5.6 MIL/uL (ref 4.46–5.70)
RDW: 12.6 % (ref 10.2–14.0)
WBC: 7.3 THOU/uL (ref 3.5–11.0)

## 2010-12-26 LAB — CREATININE WITH EGFR: CREATININE: 1.14 mg/dL (ref 0.62–1.27)

## 2010-12-26 LAB — CREATININE: ESTIMATED GLOMERULAR FILTRATION RATE: >59 mL/min/{1.73_m2} (ref >59)

## 2010-12-26 LAB — AST (SGOT): AST (SGOT): 23 U/L (ref 8–48)

## 2011-01-13 ENCOUNTER — Encounter (INDEPENDENT_AMBULATORY_CARE_PROVIDER_SITE_OTHER): Payer: 59 | Admitting: RHEUMATOLOGY

## 2011-01-16 ENCOUNTER — Encounter (INDEPENDENT_AMBULATORY_CARE_PROVIDER_SITE_OTHER): Payer: Self-pay

## 2011-01-28 ENCOUNTER — Encounter (INDEPENDENT_AMBULATORY_CARE_PROVIDER_SITE_OTHER): Payer: Self-pay

## 2011-02-03 ENCOUNTER — Encounter (INDEPENDENT_AMBULATORY_CARE_PROVIDER_SITE_OTHER): Payer: Self-pay | Admitting: Family Medicine

## 2011-03-03 LAB — URINALYSIS, ROUTINE W REFLEX MICROSCOPIC
Bilirubin Urine: NEGATIVE
Glucose, UA: NEGATIVE mg/dL
Ketones, ur: NEGATIVE mg/dL
Leukocytes, UA: NEGATIVE
Nitrite: NEGATIVE
Specific Gravity, Urine: 1.014 (ref 1.005–1.030)
pH: 7 (ref 5.0–8.0)

## 2011-03-03 LAB — DIFFERENTIAL
Basophils Absolute: 0 10*3/uL (ref 0.0–0.1)
Basophils Relative: 1 % (ref 0–1)
Eosinophils Absolute: 0.1 10*3/uL (ref 0.0–0.7)
Neutro Abs: 1.5 10*3/uL — ABNORMAL LOW (ref 1.7–7.7)
Neutrophils Relative %: 41 % — ABNORMAL LOW (ref 43–77)

## 2011-03-03 LAB — BASIC METABOLIC PANEL
BUN: 11 mg/dL (ref 6–23)
CO2: 30 mEq/L (ref 19–32)
Calcium: 9.2 mg/dL (ref 8.4–10.5)
Chloride: 100 mEq/L (ref 96–112)
Creatinine, Ser: 0.8 mg/dL (ref 0.4–1.5)

## 2011-03-03 LAB — CBC
MCHC: 34 g/dL (ref 30.0–36.0)
MCV: 86 fL (ref 78.0–100.0)
Platelets: 299 10*3/uL (ref 150–400)
RDW: 11.5 % (ref 11.5–15.5)

## 2011-03-03 LAB — URINE MICROSCOPIC-ADD ON

## 2011-03-26 ENCOUNTER — Encounter (INDEPENDENT_AMBULATORY_CARE_PROVIDER_SITE_OTHER): Payer: 59 | Admitting: RHEUMATOLOGY

## 2011-04-16 ENCOUNTER — Other Ambulatory Visit (INDEPENDENT_AMBULATORY_CARE_PROVIDER_SITE_OTHER): Payer: Self-pay | Admitting: RHEUMATOLOGY

## 2011-04-16 NOTE — Telephone Encounter (Signed)
Message copied by Lance Muss on Thu Apr 16, 2011 11:50 AM  ------       Message from: Evalina Field       Created: Wed Apr 15, 2011  8:51 AM       Regarding: RE: medication issue         >> NACOL CONRAD 04/10/2011 03:50 PM       Dr.Brick       Shemika from [facility] states that the pt is noncompliant with the enbrel, they have not been in contact with him since March. Is he supposed to be taking the medication? Please return her call to [phone], the pt ID # is [id number]

## 2011-04-16 NOTE — Telephone Encounter (Signed)
Multiple attempts made to reach pt.  Pt has not returned any calls to date. Lance Muss 04/16/2011, 11:51 AM      From Evalina Field, MD Sent Wednesday Apr 15, 2011 8:51 AM To Lance Muss Subject RE: medication issue     Message Its in my last note that he is, pls call him and ask him.

## 2011-04-20 NOTE — Letter (Signed)
[facility] Department of Orthopaedics  [address]  [place], [place] [address]      SPORTS [facility]INE CLINIC    PATIENT NAME: Chase [name], Chase [name]  CHART NUMBER: [id number]  DATE OF BIRTH: 05-08-1966  DATE OF SERVICE: 02/02/2008    February 02, 2008    Julian Hy DO  [address] [place], [place] [address]    DATE OF INJURY:  10/03/2007  CLAIM NUMBER:  [id number]  SOCIAL SECURITY NUMBER:  [phone]    To Whom It May Concern and Dr. Leonette Monarch:    We had the pleasure of seeing [name].  As you know, he is a 45 year old gentleman who comes in after undergoing a closed treatment of left distal tibia fracture.  We saw him last time for a concern for delayed union.  We recommended he come back to see [place] in May at which point we would get repeat x-rays and if it had not healed, we would consider doing a CT scan at that time.  The patient says over the last couple of weeks it has been swelling on him and he was concerned about the fracture healing potential, so he scheduled an appointment to come to see [place] today.  He has no new pain.  He has been working on it.  He still walks with a mild limp.    Objectively, he is seated, in no significant distress.  The patient's foot shows mild edema.  He really is not tender there to deep palpation or stressing of the fracture site.  He is grossly neurovascularly intact distally.  He has good knee range of motion.  There are no signs of infection.    Imaging:  X-rays were taken today, which showed callus on 3 or 4 cortices.  There is still a fracture line there, although it appears to be resolving.  No other fracture or dislocation identified at this point.    Assessment:  Status post closed reduction and casting of left distal tibia fracture with bridging cortices with continued fracture line.     Plan:  At this point in time, Mr. Vavra to continue weightbearing as tolerated.  He should continue working.  We asked that he reschedule his May 14 appointment, at which point we will see him at that point and get x-rays of the left tibia.  We told him that there will be swelling and pain that will gradually resolve.  We believe that the limp that he is experiencing may last until the fall and over time this should improve.  The patient seems amenable to this.  We allowed him to take anti-inflammatories such as ibuprofen for pain control.  If he has questions or concerns, we would be glad to see him sooner.  Wish him the best of luck.    Sincerely,      Jessie Foot, MD  Resident  [facility] Department of Orthopaedics    Alen Bleacher, MD  Assistant Professor  [facility] Department of Orthopaedics    [id number]; D: 02/02/2008 11:16:08; T: 02/06/2008 02:37:23    cc: Worker's Compensation       [address]       [place], [place] [address]            Julian Hy DO      [address]      [place], [place] [address]

## 2011-05-08 ENCOUNTER — Encounter (INDEPENDENT_AMBULATORY_CARE_PROVIDER_SITE_OTHER): Payer: Self-pay | Admitting: Rheumatology

## 2011-05-08 NOTE — Progress Notes (Signed)
Refill request from [facility] for Enbrel put in doctors work room for authorization Benson Setting 05/08/2011, 12:44 PM

## 2011-05-13 ENCOUNTER — Encounter (INDEPENDENT_AMBULATORY_CARE_PROVIDER_SITE_OTHER): Payer: Self-pay | Admitting: RHEUMATOLOGY

## 2011-05-13 NOTE — Progress Notes (Signed)
Rx request/Proactive Refill Authorization faxed to [facility]. [employer], LPN 1/61/0960, 45:40 AM

## 2011-08-13 ENCOUNTER — Encounter (INDEPENDENT_AMBULATORY_CARE_PROVIDER_SITE_OTHER): Payer: 59 | Admitting: RHEUMATOLOGY

## 2011-09-09 ENCOUNTER — Ambulatory Visit (HOSPITAL_BASED_OUTPATIENT_CLINIC_OR_DEPARTMENT_OTHER): Payer: 59

## 2011-09-09 ENCOUNTER — Ambulatory Visit
Admission: RE | Admit: 2011-09-09 | Discharge: 2011-09-09 | Disposition: A | Discharge status: Home / Self Care | Payer: 59 | Source: Ambulatory Visit | Attending: RHEUMATOLOGY | Admitting: RHEUMATOLOGY

## 2011-09-09 ENCOUNTER — Ambulatory Visit (HOSPITAL_BASED_OUTPATIENT_CLINIC_OR_DEPARTMENT_OTHER)
Admission: RE | Admit: 2011-09-09 | Discharge: 2011-09-09 | Disposition: A | Discharge status: Home / Self Care | Payer: 59 | Source: Ambulatory Visit | Admitting: Rheumatology

## 2011-09-09 ENCOUNTER — Encounter (INDEPENDENT_AMBULATORY_CARE_PROVIDER_SITE_OTHER): Payer: Self-pay | Admitting: RHEUMATOLOGY

## 2011-09-09 ENCOUNTER — Ambulatory Visit (HOSPITAL_BASED_OUTPATIENT_CLINIC_OR_DEPARTMENT_OTHER): Payer: 59 | Admitting: RHEUMATOLOGY

## 2011-09-09 VITALS — BP 119/78 | HR 79 | Temp 96.5°F | Resp 12 | Ht 70.0 in | Wt 215.8 lb

## 2011-09-09 DIAGNOSIS — L4050 Arthropathic psoriasis, unspecified: Secondary | ICD-10-CM | POA: Insufficient documentation

## 2011-09-09 LAB — CBC/DIFF
BASOPHILS: 1 % (ref 0–1)
BASOS ABS: 0.07 THOU/uL (ref 0.0–0.2)
EOS ABS: 0.175 THOU/uL (ref 0.1–0.3)
EOSINOPHIL: 3 % (ref 1–6)
HCT: 48.1 % (ref 39.8–50.2)
HGB: 16.1 g/dL (ref 13.1–17.3)
LYMPHOCYTES: 32 % (ref 20–45)
LYMPHS ABS: 1.73 THOU/uL (ref 1.0–4.8)
MCH: 29.3 pg (ref 27.4–33.0)
MCHC: 33.4 g/dL (ref 31.6–35.5)
MCV: 87.8 fL (ref 82.0–99.0)
MONOCYTES: 11 % (ref 4–13)
MONOS ABS: 0.572 10*3/uL (ref 0.1–0.9)
MPV: 6.8 FL — ABNORMAL LOW (ref 7.4–10.4)
NRBC'S: 0 /100{WBCs}
PLATELET COUNT: 294 THOU/uL (ref 140–450)
PMN ABS: 2.85 THOU/uL (ref 1.5–7.7)
PMN'S: 53 % (ref 40–75)
RBC: 5.48 MIL/uL (ref 4.46–5.70)
RDW: 12.4 % (ref 10.2–14.0)
WBC: 5.4 THOU/uL (ref 3.5–11.0)

## 2011-09-09 LAB — AST (SGOT): AST (SGOT): 21 U/L (ref 8–48)

## 2011-09-09 LAB — CREATININE WITH EGFR
CREATININE: 1.07 mg/dL (ref 0.62–1.27)
ESTIMATED GLOMERULAR FILTRATION RATE: >59 ml/min/1.73m2 (ref >59)

## 2011-09-09 NOTE — Progress Notes (Signed)
The patient received a influenza vaccine and tolerated it well.  Barbette Hair 09/09/2011, 8:39 AM

## 2011-09-09 NOTE — Student (Addendum)
Subjective  Chase [name] is a 45 y.o. year old male who presents for follow up to clinic for psoriatic arthritis. He reports over the last 4-5 months symptoms have gotten worse with joint pain and weakness. He reports a lot of pain primarily in his cervical spine and lumbar spine. He works as a Research scientist (life sciences) in a [employer] so his work requires him to do a lot of climbing which he is beginning to struggle with due to grip with his hands. He has been taking his Etanercept weekly without issues but he does not feel as if it is helping. He is also taking Azathioprine bid with little help. He has been taking 3 aspirin at a time many days to help relieve most of the pain. He is complaining of stiffness and trouble getting started once sitting for a for minutes. He and his wife are contemplating moving to [facility] to get away from the cold weather.     Current Outpatient Prescriptions   Medication Sig   . azathioprine (IMURAN) 50 mg Oral Tablet take 50 mg by mouth Twice daily.   Marland Kitchen amlodipine (NORVASC) 10 mg Oral Tablet take 1 Tab by mouth Once a day.   . enalapril (VASOTEC) 20 mg Oral Tablet take 1 Tab by mouth Once a day.   . Metformin (GLUCOPHAGE XR) 500 mg Oral Tablet Sustained Release 24 hr take 2 Tabs by mouth Once a day.   . metoprolol succinate (TOPROL-XL) 50 mg Tb24 take 1 Tab by mouth Once a day.    . pioglitazone (ACTOS) 30 mg Tab take 1 Tab by mouth Once a day.    . Etanercept 25 mg Kit 50 mg by Subcutaneous route Q7 Days.    . Calcipotriene 0.005 % Oint by Topical route Twice daily.    . Blood Sugar Diagnostic (FREESTYLE LITE STRIPS) Strp 1 Stick by Percutaneous route Three times a day.    . lancet (diabetic supply) Misc 1 Each by Does not apply route Per Instructions.    Marland Kitchen DISCONTD: D5W SolP 100 mL with azathioprine 100 mg SolR by Intravenous route every 24 hours.       Objective  General: Well appearing white male in no acute distress.   HEENT: Psoriatic rash around left lower eyelid. PERRLA. EOMi.      Cardiac: RRR without murmurs, rubs, or gallops. No edema noted on extremities. +2 pulses radial artery bilaterally.   Lungs: Clear to auscultation.   Musculoskeletal: Stiffness and tenderness to palpation of cervical spine. Minor tenderness to palpation on lumbar spine. Full ROM of spine on forward and lateral bending. No tenderness and full ROM of hand joints bilaterally. Grip 5/5 on strength.   Skin: Psoriatic rash on both knees. No other rash or lesions noted.     Assessment/Plan  1. Psoriatic Arthritis      Chase [name] psoriatic arthritis has been worsening over the last 4-5 months with no help from current medication regimen.     1. Increase Azathioprine to tid for 3 months.   2. Influenza vaccination today.   3. Order labs CBC/diff, AST, Creatinine  4. Xray cervical, thoracic, and lumbar spine.   5. RTC in 3 months or sooner if increase in Imuran does not help.     Orders Placed This Encounter   . XR CERVICAL SPINE SERIES W/FLEX EXT   . XR THORACIC SPINE   . XR LUMBOSACRAL SPINE   . INFLUENZA VACCINE  IM AGE 12 THROUGH ADULT (ADMIN)   .  CBC/DIFF   . Creatinine   . 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 Having more aches and pains.  isnt sure he can keep working.  Skin isnt bad no drug problems. Skin isnt worse.  HX exam seen with MS. PMFSH ROS reviewed.  Lets try increase AZA.  Labs     BP 119/78  Pulse 79  Temp(Src) 35.8 C (96.5 F) (Tympanic)  Resp 12  Ht 1.778 m (5\' 10" )  Wt 97.886 kg (215 lb 12.8 oz)  BMI 30.96 kg/m2  SpO2 96%      1. Psoriatic Arthritis (696.0)  CBC/DIFF, CREATININE, AST (SGOT), XR CERVICAL SPINE SERIES W/FLEX EXT, XR THORACIC SPINE, XR LUMBOSACRAL SPINE, INFLUENZA VACCINE  IM AGE 10 THROUGH ADULT (ADMIN)       Evalina Field, MD

## 2011-10-06 ENCOUNTER — Ambulatory Visit (INDEPENDENT_AMBULATORY_CARE_PROVIDER_SITE_OTHER): Payer: Self-pay | Admitting: RHEUMATOLOGY

## 2011-10-06 NOTE — Telephone Encounter (Signed)
Multiple attempts made to contact pt.  Left voicemail for pt to call us at his convenience.Lance Muss 10/06/2011, 8:51 AM      Tell him no fractures. Not a lot of arthritis. Does have a calcium deposit next to his spine that is probably related to his Arthritis. Not a concern.

## 2011-10-06 NOTE — Telephone Encounter (Signed)
Message copied by Lance Muss on Tue Oct 06, 2011  8:50 AM  ------       Message from: Evalina Field       Created: Thu Oct 01, 2011  1:11 PM       Regarding: RE: pt having problems         >> NANCY CLINE 10/01/2011 12:00 PM       Brick Pt:    Pt says he had a lot of xrays done a couple of weeks ago and wants to know the results and that he is having some problems he would like to discuss. Thank you

## 2011-12-16 ENCOUNTER — Ambulatory Visit: Payer: 59 | Attending: RHEUMATOLOGY | Admitting: RHEUMATOLOGY

## 2011-12-16 ENCOUNTER — Encounter (INDEPENDENT_AMBULATORY_CARE_PROVIDER_SITE_OTHER): Payer: Self-pay | Admitting: RHEUMATOLOGY

## 2011-12-16 VITALS — BP 143/96 | HR 18 | Temp 97.0°F | Resp 18 | Wt 216.0 lb

## 2011-12-16 DIAGNOSIS — L4050 Arthropathic psoriasis, unspecified: Secondary | ICD-10-CM | POA: Insufficient documentation

## 2011-12-16 NOTE — Progress Notes (Signed)
Subjective  Chase [name] is a 46 y.o. year old male who presents for Follow-up   to clinic.  Skin is worse.  No shot problems(enbrel 50mg / wk) but more pains now.  Afraid he will fall at wprk from his arthritis os DM.  On AZA 150/wk.  Stiff 1-2 hrs in AM.  Just had labs at home will get sent to [place].    Current Outpatient Prescriptions   Medication Sig   . azathioprine (IMURAN) 50 mg Oral Tablet take 50 mg by mouth Twice daily.   Marland Kitchen amlodipine (NORVASC) 10 mg Oral Tablet take 1 Tab by mouth Once a day.   . enalapril (VASOTEC) 20 mg Oral Tablet take 1 Tab by mouth Once a day.   . Metformin (GLUCOPHAGE XR) 500 mg Oral Tablet Sustained Release 24 hr take 2 Tabs by mouth Once a day.   . metoprolol succinate (TOPROL-XL) 50 mg Tb24 take 1 Tab by mouth Once a day.    . pioglitazone (ACTOS) 30 mg Tab take 1 Tab by mouth Once a day.    . Etanercept 25 mg Kit 50 mg by Subcutaneous route Q7 Days.    . Calcipotriene 0.005 % Oint by Topical route Twice daily.    . Blood Sugar Diagnostic (FREESTYLE LITE STRIPS) Strp 1 Stick by Percutaneous route Three times a day.    . lancet (diabetic supply) Misc 1 Each by Does not apply route Per Instructions.        Objective  Vitals: BP 143/96   Pulse 18   Temp(Src) 36.1 C (97 F) (Tympanic)   Resp 18   Wt 97.977 kg (216 lb)   SpO2 98%  General:  no acute distress and no cane limp  Eyes: Conjunctiva clear., Pupils equal and round.   Joints:  Minimal synovitis.    Skin: psoriasis mild/moderate  Neurologic: No weakness, tremor, or atrophy across any joint  Assessment/Plan  1. Psoriatic Arthritis        Orders Placed This Encounter   . CBC/DIFF   . Creatinine   . Ast (Sgot)     No better since increased meds.  Says rash worse.    Dc enbrel.   Begin humira 40 mg qowk.  rtc 3 mos.  Come here or lmd for first shot.  Hold enbrel 2 wks before first shot.  Evalina Field, MD 12/16/2011, 9:59 AM

## 2012-03-09 ENCOUNTER — Ambulatory Visit (INDEPENDENT_AMBULATORY_CARE_PROVIDER_SITE_OTHER): Payer: 59 | Admitting: RHEUMATOLOGY

## 2012-06-14 ENCOUNTER — Other Ambulatory Visit: Payer: Self-pay | Admitting: Family Medicine

## 2012-06-14 DIAGNOSIS — N508 Other specified disorders of male genital organs: Secondary | ICD-10-CM

## 2012-06-21 ENCOUNTER — Ambulatory Visit
Admission: RE | Admit: 2012-06-21 | Discharge: 2012-06-21 | Disposition: A | Discharge status: Home / Self Care | Payer: 59 | Source: Ambulatory Visit | Attending: Family Medicine | Admitting: Family Medicine

## 2012-06-21 DIAGNOSIS — N508 Other specified disorders of male genital organs: Secondary | ICD-10-CM

## 2012-07-26 ENCOUNTER — Encounter (INDEPENDENT_AMBULATORY_CARE_PROVIDER_SITE_OTHER): Payer: 59 | Admitting: RHEUMATOLOGY

## 2012-07-28 ENCOUNTER — Ambulatory Visit (HOSPITAL_BASED_OUTPATIENT_CLINIC_OR_DEPARTMENT_OTHER): Payer: 59 | Admitting: RHEUMATOLOGY

## 2012-07-28 ENCOUNTER — Ambulatory Visit
Admission: RE | Admit: 2012-07-28 | Discharge: 2012-07-28 | Disposition: A | Discharge status: Home / Self Care | Payer: 59 | Source: Ambulatory Visit | Attending: RHEUMATOLOGY | Admitting: RHEUMATOLOGY

## 2012-07-28 ENCOUNTER — Encounter (INDEPENDENT_AMBULATORY_CARE_PROVIDER_SITE_OTHER): Payer: Self-pay | Admitting: RHEUMATOLOGY

## 2012-07-28 VITALS — BP 121/76 | HR 76 | Temp 96.7°F | Ht 68.94 in | Wt 210.1 lb

## 2012-07-28 DIAGNOSIS — R519 Headache, unspecified: Secondary | ICD-10-CM | POA: Insufficient documentation

## 2012-07-28 DIAGNOSIS — L4050 Arthropathic psoriasis, unspecified: Secondary | ICD-10-CM | POA: Insufficient documentation

## 2012-07-28 DIAGNOSIS — R51 Headache: Secondary | ICD-10-CM | POA: Insufficient documentation

## 2012-07-28 LAB — CBC/DIFF
BASOPHILS: 1 %
BASOS ABS: 0 THOU/uL (ref 0.0–0.2)
EOS ABS: 0.4 THOU/uL (ref 0.0–0.5)
EOSINOPHIL: 7 %
HCT: 48 % — ABNORMAL HIGH (ref 36.7–47.0)
HGB: 16.6 g/dL — ABNORMAL HIGH (ref 12.5–16.3)
LYMPHOCYTES: 35 %
LYMPHS ABS: 2.2 THOU/uL (ref 1.5–3.5)
MCH: 30.8 pg (ref 27.4–33.0)
MCHC: 34.7 g/dL (ref 32.5–35.8)
MCV: 89 fL (ref 78–100)
MONOCYTES: 7 %
MONOS ABS: 0.4 THOU/uL (ref 0.3–1.0)
MPV: 7.9 fL (ref 7.5–11.5)
PLATELET COUNT: 246 THOU/uL (ref 140–450)
PMN ABS: 3.3 10*3/uL (ref 2.0–6.5)
PMN'S: 50 %
RBC: 5.4 MIL/uL (ref 4.06–5.63)
RDW: 14.4 % (ref 12.0–15.0)
WBC: 6.5 THOU/uL (ref 3.5–11.0)

## 2012-07-28 LAB — CREATININE WITH EGFR
CREATININE: 1.08 mg/dL (ref 0.62–1.27)
ESTIMATED GLOMERULAR FILTRATION RATE: >59 ml/min/1.73m2 (ref >59)

## 2012-07-28 LAB — AST (SGOT): AST (SGOT): 18 U/L (ref 8–48)

## 2012-07-28 NOTE — Progress Notes (Signed)
Subjective  Chase [name] is a 46 y.o. year old male who presents for Psoriatic Arthropathy   to clinic. He has been out of country working so hasnt been back.   Never switched to humira.    But wants to.   jts are hurting.    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    HA's getting worse.    Current Outpatient Prescriptions   Medication Status Sig   . azathioprine (IMURAN) 50 mg Oral Tablet Active take 50 mg by mouth Twice daily.   Marland Kitchen amlodipine (NORVASC) 10 mg Oral Tablet Active take 1 Tab by mouth Once a day.   . enalapril (VASOTEC) 20 mg Oral Tablet Active take 1 Tab by mouth Once a day.   . Metformin (GLUCOPHAGE XR) 500 mg Oral Tablet Sustained Release 24 hr Active take 2 Tabs by mouth Once a day.   . metoprolol succinate (TOPROL-XL) 50 mg Tb24 Active take 1 Tab by mouth Once a day.    . pioglitazone (ACTOS) 30 mg Tab Active take 1 Tab by mouth Once a day.    . Etanercept 25 mg Kit Active 50 mg by Subcutaneous route Q7 Days.    . Calcipotriene 0.005 % Oint Active by Topical route Twice daily.    . Blood Sugar Diagnostic (FREESTYLE LITE STRIPS) Strp Active 1 Stick by Percutaneous route Three times a day.    . lancet (diabetic supply) Misc Active 1 Each by Does not apply route Per Instructions.      No current facility-administered medications for this visit.       Objective  Vitals: BP 121/76   Pulse 76   Temp(Src) 35.9 C (96.7 F) (Tympanic)   Ht 1.751 m (5' 8.94")   Wt 95.3 kg (210 lb 1.6 oz)   BMI 31.08 kg/m2   SpO2 96%  General:  no acute distress  Eyes: Conjunctiva clear.  Joints:  Not much synovitis.    Skin: psoriasis  mild  Neurologic: No weakness, tremor, or atrophy across any joint  Psychiatric: normal affect, behavior, memory, thought content, judgement, and speech.   Assessment/Plan  1. Psoriatic Arthritis    2. Worsening headaches        Orders Placed This Encounter   . CBC/DIFF   . Ast (Sgot)   . Creatinine   . Referral to Neurology      Cont same switch to humira in [facility] and see neurology then.  Labs RTC in dec  Evalina Field, MD 07/28/2012, 4:05 PM

## 2012-07-28 NOTE — Student (Addendum)
Subjective  Annette Liotta is a 46 y.o. year old male who presents for follow-up for psoriatic arthritis. He was last seen in January 2013 and was prescribed Humira since Enbrel was not working. He states he has been out of the country for the past 8 months, and never switched medications so he has been taking Enbrel since then. He is also taking Imuran 50 mg qd. He is interested in switching to Humira, but states he is unable to at this moment because he will be leaving to go out of town for work on Sunday until December.   He states his arthritis symptoms have worsened in his neck and back and he is still for about 1 hour in the mornings.     Current Outpatient Prescriptions   Medication Status Sig   . azathioprine (IMURAN) 50 mg Oral Tablet Active take 50 mg by mouth Twice daily.   Marland Kitchen amlodipine (NORVASC) 10 mg Oral Tablet Active take 1 Tab by mouth Once a day.   . enalapril (VASOTEC) 20 mg Oral Tablet Active take 1 Tab by mouth Once a day.   . Metformin (GLUCOPHAGE XR) 500 mg Oral Tablet Sustained Release 24 hr Active take 2 Tabs by mouth Once a day.   . metoprolol succinate (TOPROL-XL) 50 mg Tb24 Active take 1 Tab by mouth Once a day.    . pioglitazone (ACTOS) 30 mg Tab Active take 1 Tab by mouth Once a day.    . Etanercept 25 mg Kit Active 50 mg by Subcutaneous route Q7 Days.    . Calcipotriene 0.005 % Oint Active by Topical route Twice daily.    . Blood Sugar Diagnostic (FREESTYLE LITE STRIPS) Strp Active 1 Stick by Percutaneous route Three times a day.    . lancet (diabetic supply) Misc Active 1 Each by Does not apply route Per Instructions.      No current facility-administered medications for this visit.       Objective  Vitals: BP 121/76  Pulse 76  Temp(Src) 35.9 C (96.7 F) (Tympanic)  Ht 1.751 m (5' 8.94")  Wt 95.3 kg (210 lb 1.6 oz)  BMI 31.08 kg/m2  SpO2 96%  General:  no acute distress  Neck: no adenopathy  Cardiovascular: RRR without murmur.  Lungs: clear to auscultation bilaterally.      Abdomen: soft, non-tender and bowel sounds normal  Extremities: No edema or cyanosis  Joints:  Full ROM and no synovitis of the upper or lower extremities    Assessment/Plan  1. Psoriatic Arthritis    2. Worsening headaches    This is a 46 y.o. male who presents to clinic for follow-up of psoriatic arthritis. He is currently taking Enbrel and Imuran but states his arthritis symptoms are worsening.     1. Psoriatic arthritis   Sx worse  -Continue Enbrel and Imuran at current doses.   -Schedule appt for December when patient comes back in town and will start Humira at this time. Advised patient that he must come in to the clinic for his first shot and cannot be out of town for this.   -Recheck labs: CBC w/diff, AST, Creatinine    2. Worsening headaches  -Patient states his neck stiffness has caused him to have worsening headaches.   -Refer to neurology for further evaluation.     Orders Placed This Encounter   . CBC/DIFF   . Ast (Sgot)   . Creatinine   . Referral to Neurology       [facility]  Marlane Mingle, MED STUDENT 07/28/2012, 3:52 PM    HX exam seen with MS. PMFSH ROS reviewed.     BP 121/76  Pulse 76  Temp(Src) 35.9 C (96.7 F) (Tympanic)  Ht 1.751 m (5' 8.94")  Wt 95.3 kg (210 lb 1.6 oz)  BMI 31.08 kg/m2  SpO2 96%      1. Psoriatic Arthritis (696.0)  CBC/DIFF, AST (SGOT), CREATININE, AMB CONSULT/REFERRAL NEUROLOGY   2. Worsening headaches (784.0)  AMB CONSULT/REFERRAL NEUROLOGY       Evalina Field, MD

## 2012-08-01 ENCOUNTER — Other Ambulatory Visit (INDEPENDENT_AMBULATORY_CARE_PROVIDER_SITE_OTHER): Payer: Self-pay | Admitting: RHEUMATOLOGY

## 2012-08-01 MED ORDER — ETANERCEPT 25 MG (1 ML) SUBCUTANEOUS POWDER FOR SOLUTION
50.0000 mg | SUBCUTANEOUS | Status: DC
Start: 2012-08-01 — End: 2014-04-13

## 2012-08-01 NOTE — Telephone Encounter (Signed)
Message copied by Lance Muss on Mon Aug 01, 2012 12:12 PM  ------       Message from: Michel Bickers       Created: Mon Aug 01, 2012 11:31 AM       Regarding: rx         >> Michel Bickers 08/01/2012 11:31 AM       Evalina Field, MD              Needs refills of enbril syringes 50 mg 1 x week  ------

## 2012-08-16 ENCOUNTER — Encounter (INDEPENDENT_AMBULATORY_CARE_PROVIDER_SITE_OTHER): Payer: 59 | Admitting: RHEUMATOLOGY

## 2012-11-04 ENCOUNTER — Encounter (INDEPENDENT_AMBULATORY_CARE_PROVIDER_SITE_OTHER): Payer: 59 | Admitting: RHEUMATOLOGY

## 2012-11-04 ENCOUNTER — Ambulatory Visit (INDEPENDENT_AMBULATORY_CARE_PROVIDER_SITE_OTHER): Payer: 59 | Admitting: Neurology

## 2012-11-22 ENCOUNTER — Ambulatory Visit (INDEPENDENT_AMBULATORY_CARE_PROVIDER_SITE_OTHER): Payer: Self-pay | Admitting: RHEUMATOLOGY

## 2012-11-22 NOTE — Telephone Encounter (Signed)
Prior auth started with [facility] based on previous prior auth.

## 2012-11-22 NOTE — Telephone Encounter (Signed)
Message copied by Drue Novel on Tue Nov 22, 2012  8:38 AM  ------       Message from: Michel Bickers       Created: Mon Nov 21, 2012  4:05 PM       Regarding: prior Berkley Harvey         >> Michel Bickers 11/21/2012 04:05 PM       Evalina Field, MD       Prior Berkley Harvey is needed for the shipment of, enbril       Call [phone] ID [id number]  ------

## 2012-11-25 ENCOUNTER — Encounter (INDEPENDENT_AMBULATORY_CARE_PROVIDER_SITE_OTHER): Payer: Self-pay | Admitting: RHEUMATOLOGY

## 2012-11-25 NOTE — Progress Notes (Signed)
Received approval for Enbrel from [facility] from 10/25/12 through 11/24/13.

## 2012-11-28 ENCOUNTER — Ambulatory Visit (INDEPENDENT_AMBULATORY_CARE_PROVIDER_SITE_OTHER): Payer: Self-pay | Admitting: RHEUMATOLOGY

## 2012-11-28 ENCOUNTER — Other Ambulatory Visit (INDEPENDENT_AMBULATORY_CARE_PROVIDER_SITE_OTHER): Payer: Self-pay | Admitting: RHEUMATOLOGY

## 2012-11-28 NOTE — Telephone Encounter (Signed)
Received authorization from [facility] for Enbrel. Authorization is good until 11/24/13. Called pt and let him know, also gave him case id number so that he can follow up when he calls for delivery.

## 2012-11-28 NOTE — Telephone Encounter (Signed)
Message copied by Drue Novel on Mon Nov 28, 2012  1:45 PM  ------       Message from: Michel Bickers       Created: Mon Nov 28, 2012 12:21 PM       Regarding: prior Berkley Harvey         >> Michel Bickers 11/28/2012 12:21 PM       Melina Modena, MD              Please call patient back to confirm that the Enbril has been prior authorized.  He states [place] script states it has not.  He said he is in and out of our service range. If he doesn't answer leave him a message.  ------

## 2012-12-06 ENCOUNTER — Encounter (INDEPENDENT_AMBULATORY_CARE_PROVIDER_SITE_OTHER): Payer: Self-pay | Admitting: RHEUMATOLOGY

## 2012-12-06 NOTE — Progress Notes (Signed)
 Pt's Enbrel  approved from 12/06/12 through 12/06/13

## 2013-01-13 ENCOUNTER — Ambulatory Visit (INDEPENDENT_AMBULATORY_CARE_PROVIDER_SITE_OTHER): Payer: 59 | Admitting: Neurology

## 2013-07-20 ENCOUNTER — Other Ambulatory Visit: Payer: Self-pay | Admitting: Family Medicine

## 2013-07-20 DIAGNOSIS — N5089 Other specified disorders of the male genital organs: Secondary | ICD-10-CM

## 2013-07-24 ENCOUNTER — Other Ambulatory Visit: Payer: 59

## 2013-07-25 ENCOUNTER — Other Ambulatory Visit: Payer: 59

## 2013-08-02 ENCOUNTER — Ambulatory Visit
Admission: RE | Admit: 2013-08-02 | Discharge: 2013-08-02 | Disposition: A | Discharge status: Home / Self Care | Payer: 59 | Source: Ambulatory Visit | Attending: Family Medicine | Admitting: Family Medicine

## 2013-08-02 DIAGNOSIS — N5089 Other specified disorders of the male genital organs: Secondary | ICD-10-CM

## 2013-09-28 IMAGING — US US SCROTUM
1 series · 14 of 25 positions shown · non-contrast
Comparison: 12/26/2008

CLINICAL DATA: Microlithiasis

ULTRASOUND OF SCROTUM
TECHNIQUE: Complete ultrasound examination of the testicles,
epididymis, and other scrotal structures was performed.

[Series 1: us scrotum · 0.05mm/px · 14 of 59 slices shown]
[im 1/59]
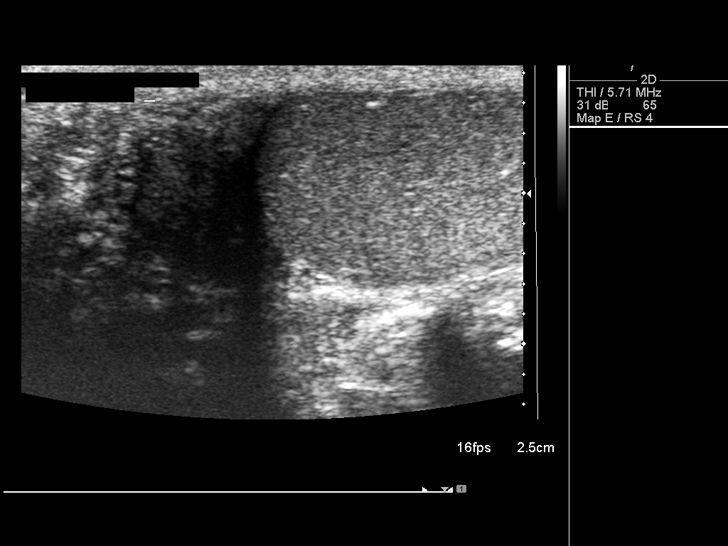
[im 5/59]
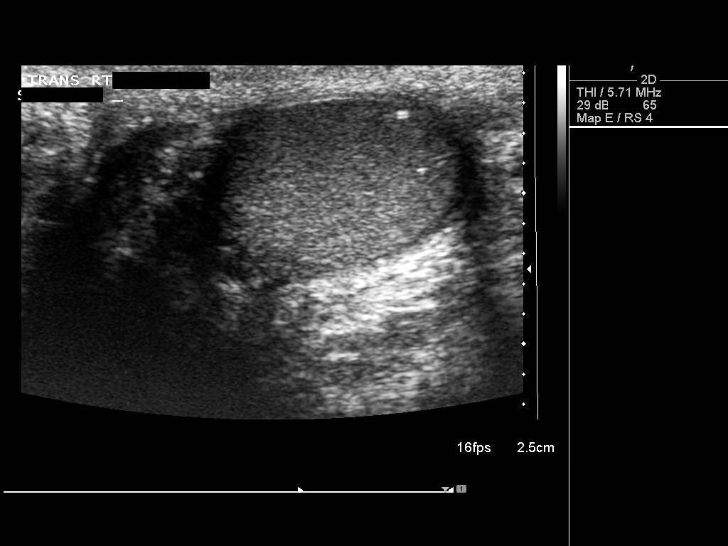
[im 10/59]
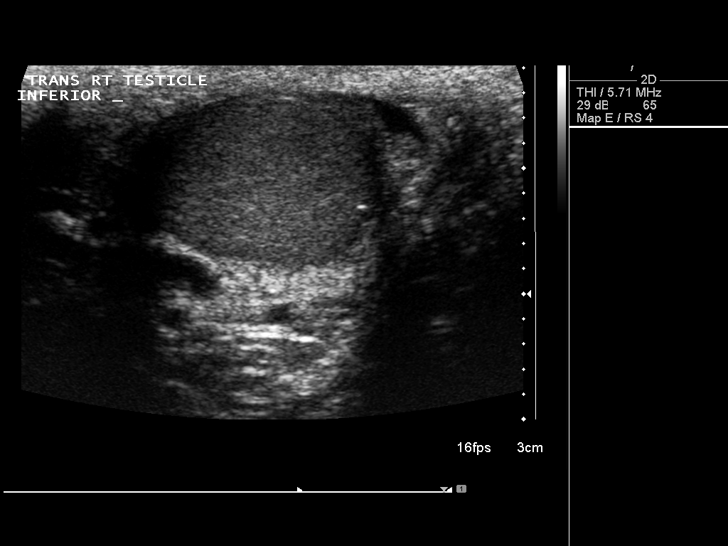
[im 15/59]
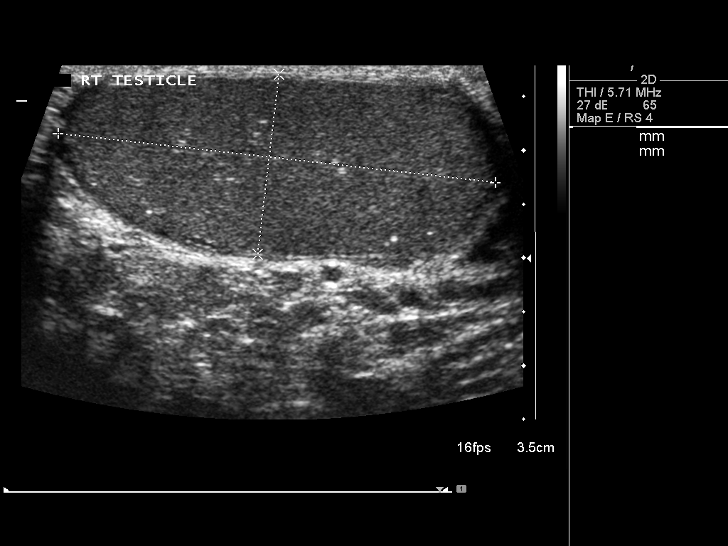
[im 20/59]
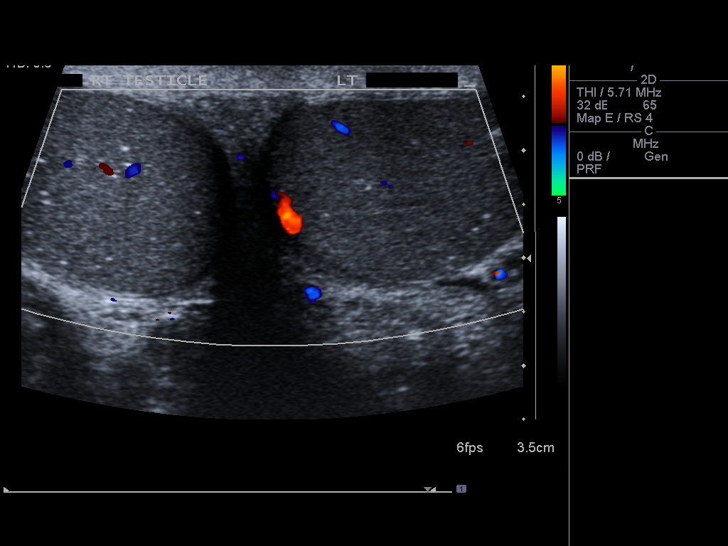
[im 22/59]
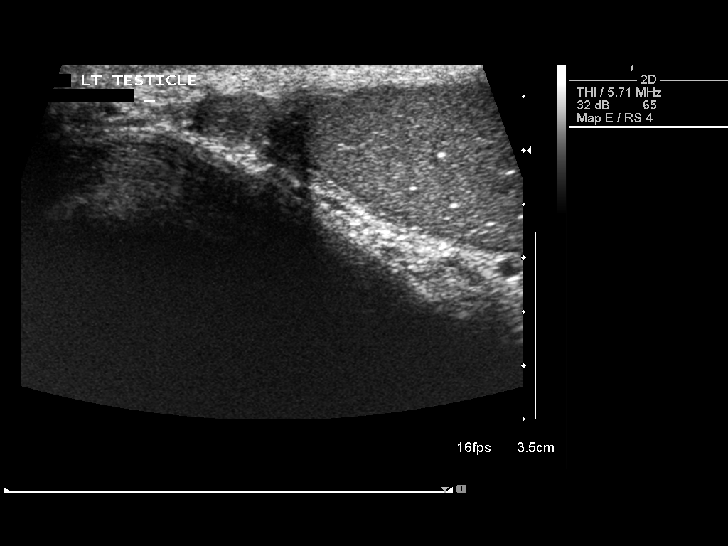
[im 27/59]
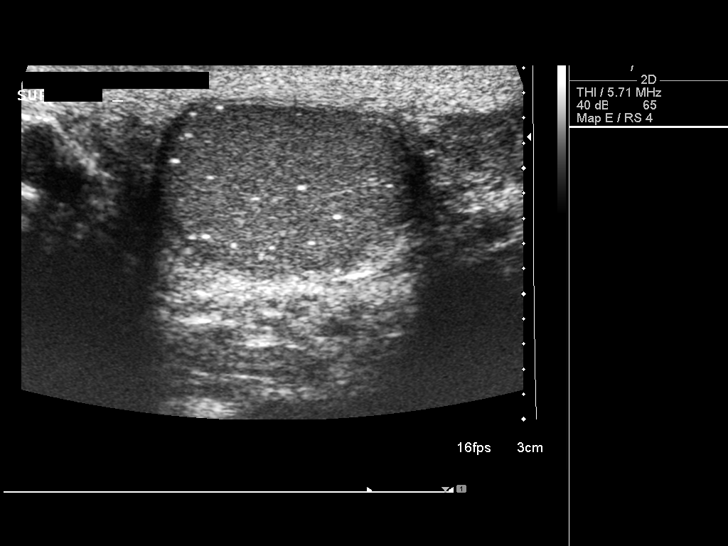
[im 32/59]
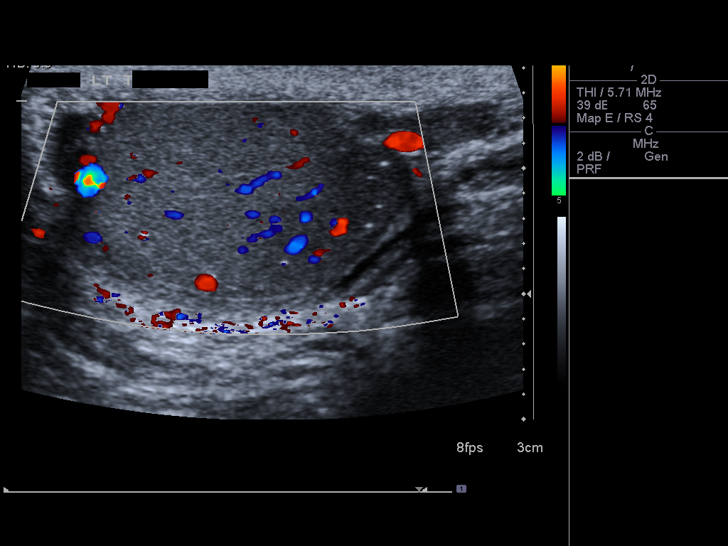
[im 37/59]
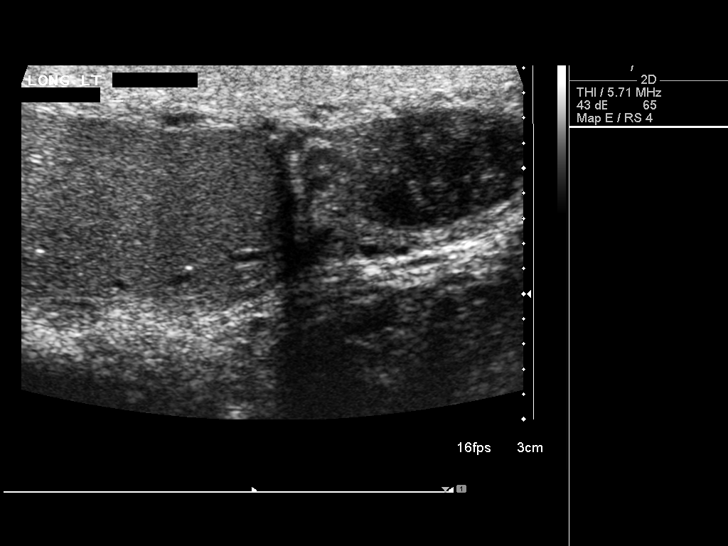
[im 39/59]
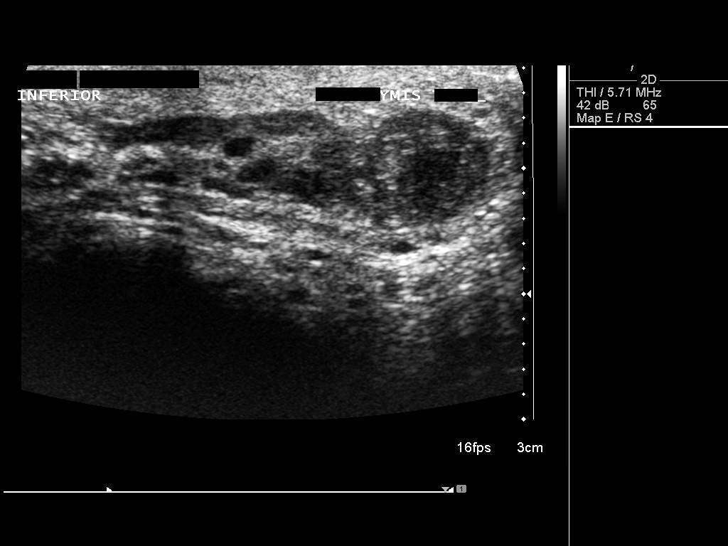
[im 44/59]
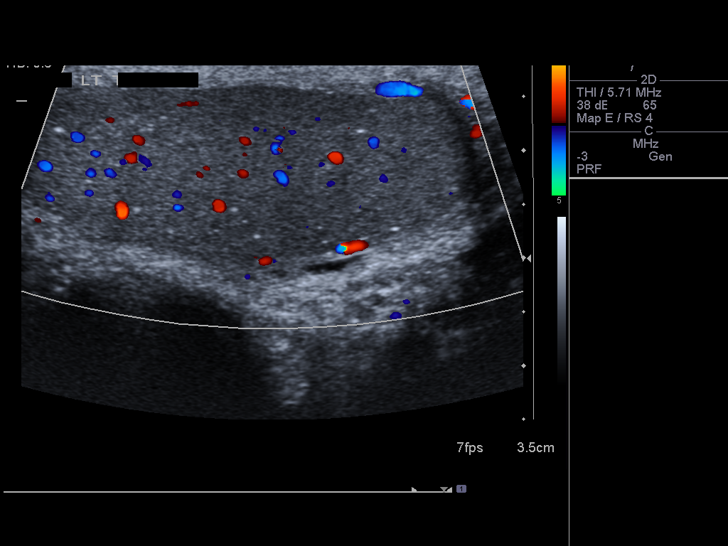
[im 49/59]
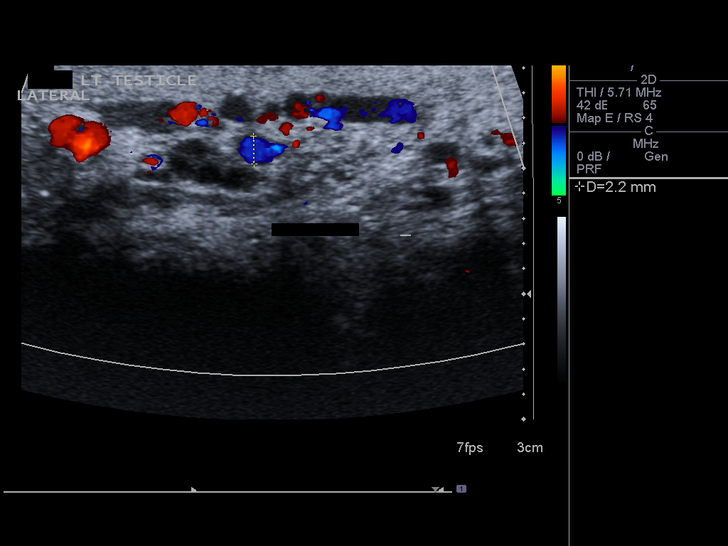
[im 54/59]
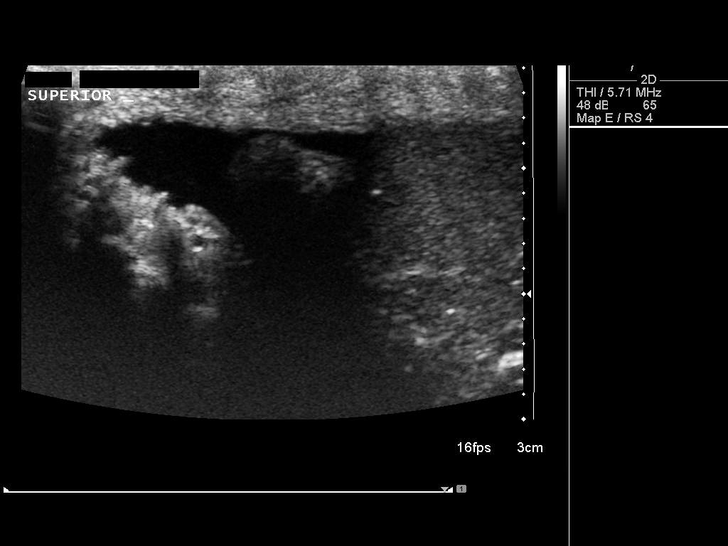
[im 59/59]
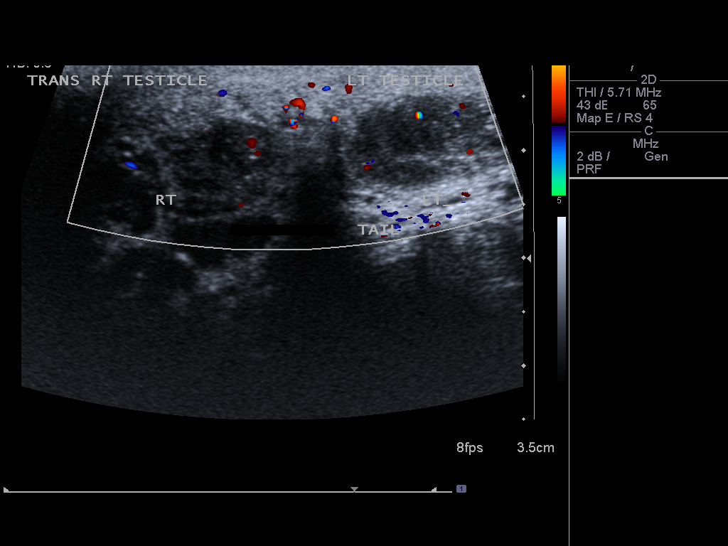

[14 of 25 positions shown; findings below may reference images not displayed]

FINDINGS: Right testis:  17 x 25 x 41 mm.  Stable microlithiasis.  No focal
mass.  Normal color Doppler signal.

Left testis:  19 x 28 x 44 mm, with stable microlithiasis.  No
focal mass.  Normal color Doppler signal.

Right epididymis:  Normal in size and appearance.

Left epididymis:  Stable fullness in the epididymal tail without
discrete mass.

Hydrocele:  Tiny, bilateral

Varicocele:  Absent
IMPRESSION: 1.  Stable testicular microlithiasis.  No acute abnormality.

## 2014-04-10 ENCOUNTER — Ambulatory Visit (INDEPENDENT_AMBULATORY_CARE_PROVIDER_SITE_OTHER): Payer: Self-pay | Admitting: RHEUMATOLOGY

## 2014-04-10 NOTE — Telephone Encounter (Signed)
Spoke with Dr. Isabell Jarvis. He is ok to overbook on Friday so pt can be seen and have his humira continued. Informed pt of appt. Pt will bring records if he can.

## 2014-04-10 NOTE — Telephone Encounter (Signed)
-----   Message from Chase [name] sent at 04/10/2014  3:30 PM EDT -----  >> Chase [name] 04/10/2014 07:57 AM  Dr. Isabell Jarvis pt  Patient called back to see if you received his records and if you would be willing to call in an Rx for humira, please call him to let him know.  He said it is very noisy where he works & if he is unable to answer the phone please leave a message.    >> Chase [name] 03/27/2014 12:14 PM  Pt of Dr.Brick  The pt was seen by you in the past, he went to another rheumatologist in [place] because it was closer. He states they closed and he has been without humira for 6 to 7 weeks. Did you receive his records and if so, can you call in the meds for him? Please return his call, it is okay to leave a message. He states the humira would need to be called in to [facility]

## 2014-04-13 ENCOUNTER — Ambulatory Visit
Admission: RE | Admit: 2014-04-13 | Discharge: 2014-04-13 | Disposition: A | Discharge status: Home / Self Care | Payer: BC Managed Care – PPO | Source: Ambulatory Visit | Attending: RHEUMATOLOGY | Admitting: RHEUMATOLOGY

## 2014-04-13 ENCOUNTER — Other Ambulatory Visit (INDEPENDENT_AMBULATORY_CARE_PROVIDER_SITE_OTHER): Payer: Self-pay | Admitting: RHEUMATOLOGY

## 2014-04-13 ENCOUNTER — Ambulatory Visit (HOSPITAL_BASED_OUTPATIENT_CLINIC_OR_DEPARTMENT_OTHER): Payer: BC Managed Care – PPO | Admitting: RHEUMATOLOGY

## 2014-04-13 ENCOUNTER — Encounter (INDEPENDENT_AMBULATORY_CARE_PROVIDER_SITE_OTHER): Payer: Self-pay | Admitting: RHEUMATOLOGY

## 2014-04-13 VITALS — BP 138/85 | HR 86 | Temp 97.3°F | Ht 69.0 in | Wt 217.6 lb

## 2014-04-13 DIAGNOSIS — M2550 Pain in unspecified joint: Secondary | ICD-10-CM | POA: Insufficient documentation

## 2014-04-13 DIAGNOSIS — L4050 Arthropathic psoriasis, unspecified: Secondary | ICD-10-CM

## 2014-04-13 DIAGNOSIS — M2540 Effusion, unspecified joint: Secondary | ICD-10-CM | POA: Insufficient documentation

## 2014-04-13 DIAGNOSIS — L408 Other psoriasis: Secondary | ICD-10-CM | POA: Insufficient documentation

## 2014-04-13 LAB — CBC/DIFF
BASOPHILS: 1 %
BASOS ABS: 0.08 10*3/uL (ref 0.000–0.200)
EOS ABS: 0.18 THOU/uL (ref 0.000–0.500)
EOSINOPHIL: 2 %
HCT: 51.6 % — ABNORMAL HIGH (ref 36.7–47.0)
HGB: 17.3 g/dL — ABNORMAL HIGH (ref 12.5–16.3)
LYMPHOCYTES: 24 %
LYMPHS ABS: 1.93 10*3/uL (ref 1.000–4.800)
MCH: 30.2 pg (ref 27.4–33.0)
MCHC: 33.5 g/dL (ref 32.5–35.8)
MCV: 90.2 fL (ref 78–100)
MONOCYTES: 8 %
MONOS ABS: 0.651 10*3/uL (ref 0.300–1.000)
MONOS ABS: 0.651 10*3/uL (ref 0.300–1.000)
MPV: 7.4 fL — ABNORMAL LOW (ref 7.5–11.5)
PLATELET COUNT: 248 10*3/uL (ref 140–450)
PMN ABS: 5.259 10*3/uL (ref 1.500–7.700)
PMN'S: 65 %
RBC: 5.72 MIL/uL — ABNORMAL HIGH (ref 4.06–5.63)
RDW: 13.9 % (ref 12.0–15.0)
WBC: 8.1 10*3/uL (ref 3.5–11.0)

## 2014-04-13 LAB — AST (SGOT): AST (SGOT): 26 U/L (ref 8–48)

## 2014-04-13 LAB — CREATININE
CREATININE: 1.11 mg/dL (ref 0.62–1.27)
ESTIMATED GLOMERULAR FILTRATION RATE: >59 mL/min/{1.73_m2} (ref >59)

## 2014-04-13 MED ORDER — ADALIMUMAB 40 MG/0.8 ML SUBCUTANEOUS SYRINGE KIT
40.0000 mg | INJECTION | SUBCUTANEOUS | Status: DC
Start: 2014-04-13 — End: 2014-12-26

## 2014-04-13 NOTE — Progress Notes (Signed)
Subjective  Chase [name] is a 48 y.o. year old male who presents for Joint Pain   to clinic.  Long absence.  Was seeing dr Lovie Chol.   humira [id number] mg. Hasn't had for 2 mos.   Skin got worse   More pain now too and swelling  Still working maintenance on gas compressors.  Stiff 20-30 m ins I AN    Current Outpatient Prescriptions   Medication Sig    Adalimumab (HUMIRA) 40 mg/0.8 mL Subcutaneous Syringe Kit 40 mg by Subcutaneous route Every two weeks    amlodipine (NORVASC) 10 mg Oral Tablet take 1 Tab by mouth Once a day.    enalapril (VASOTEC) 20 mg Oral Tablet take 1 Tab by mouth Once a day.    Fenofibrate Micronized (LOFIBRA) 134 mg Oral Capsule Take 134 mg by mouth Every morning with breakfast    glipiZIDE (GLUCOTROL) 10 mg Oral Tablet Take 10 mg by mouth Every morning before breakfast Take 30 minutes before meals    metoprolol succinate (TOPROL-XL) 50 mg Tb24 take 1 Tab by mouth Once a day.     omeprazole (PRILOSEC) 20 mg Oral Capsule, Delayed Release(E.C.) Take 20 mg by mouth Once a day    pioglitazone (ACTOS) 30 mg Tab take 1 Tab by mouth Once a day.     Pravastatin (PRAVACHOL) 20 mg Oral Tablet Take 20 mg by mouth Every evening       Objective  Vitals: BP 138/85    Pulse 86    Temp(Src) 36.3 C (97.3 F) (Tympanic)    Ht 1.753 m (5' 9" )    Wt 98.7 kg (217 lb 9.5 oz)    BMI 32.12 kg/m2      SpO2 97%   General:  no acute distress and no cane  Eyes: Conjunctiva clear.  Joints:  No limp minuimal synovitis.  Skin: psoriasis minimaldoing ok labs RTC 4 mos  Neurologic: No weakness, tremor, or atrophy across any joint  Psychiatric: normal affect, behavior, memory, thought content, judgement, and speech.  Assessment/Plan  No diagnosis found.    No orders of the defined types were placed in this encounter.     Doing ok.  RTC.  4 mos resume humira Dennard, MD 04/13/2014, 10:25

## 2014-04-19 ENCOUNTER — Other Ambulatory Visit (INDEPENDENT_AMBULATORY_CARE_PROVIDER_SITE_OTHER): Payer: Self-pay | Admitting: Pharmacist

## 2014-04-19 ENCOUNTER — Ambulatory Visit (INDEPENDENT_AMBULATORY_CARE_PROVIDER_SITE_OTHER): Payer: Self-pay | Admitting: RHEUMATOLOGY

## 2014-04-19 DIAGNOSIS — L4050 Arthropathic psoriasis, unspecified: Secondary | ICD-10-CM

## 2014-04-19 NOTE — Telephone Encounter (Signed)
-----  Message from [place] sent at 04/19/2014 10:56 AM EDT -----  >> LAURIE FRIEND 04/19/2014 10:56 AM  Dr Isabell Jarvis    Adalimumab (HUMIRA) 40 mg/0.8 mL Subcutaneous Syringe Kit 6 Kit 2 04/13/2014     Sig - Route: 40 mg by Subcutaneous route Every 14 days Every two weeks - Subcutaneous    Prior Auth is needed

## 2014-04-19 NOTE — Telephone Encounter (Signed)
Placed order for specialty pharmacy to obtain auth. They will notify pt when it is approved.

## 2014-04-19 NOTE — Telephone Encounter (Signed)
PA form taken to clinic for Dr Serena Croissant 04/20/14.Chase [name]  04/19/2014, 15:39

## 2014-04-20 ENCOUNTER — Other Ambulatory Visit (INDEPENDENT_AMBULATORY_CARE_PROVIDER_SITE_OTHER): Payer: Self-pay | Admitting: Pharmacist

## 2014-04-20 NOTE — Telephone Encounter (Signed)
Signed prior auth faxed to [facility] [phone].[name]  04/20/2014, 13:34

## 2014-04-24 ENCOUNTER — Other Ambulatory Visit (INDEPENDENT_AMBULATORY_CARE_PROVIDER_SITE_OTHER): Payer: Self-pay | Admitting: Pharmacist

## 2014-04-24 NOTE — Telephone Encounter (Signed)
Prior Chase [name] was denied-stating an inadequate response on one or more dmard therapy has not been documented. I sent an appeal along with 10 pages of notes from [facility] records showing proof patient had been on Enbrel.Faxed today.[name]  04/24/2014, 13:15

## 2014-04-25 ENCOUNTER — Other Ambulatory Visit (INDEPENDENT_AMBULATORY_CARE_PROVIDER_SITE_OTHER): Payer: Self-pay | Admitting: Pharmacist

## 2014-04-25 NOTE — Telephone Encounter (Signed)
Prior auth for Humira is approved.[name]  04/25/2014, 13:08

## 2014-04-30 ENCOUNTER — Other Ambulatory Visit (INDEPENDENT_AMBULATORY_CARE_PROVIDER_SITE_OTHER): Payer: Self-pay | Admitting: Pharmacist

## 2014-04-30 NOTE — Telephone Encounter (Signed)
Second attempt leaving message for pt to return my call. Need to know if he wants [place] to fill and courier his Humira for him.[name]  04/30/2014, 13:35

## 2014-06-27 ENCOUNTER — Encounter (INDEPENDENT_AMBULATORY_CARE_PROVIDER_SITE_OTHER): Payer: 59 | Admitting: RHEUMATOLOGY

## 2014-08-31 ENCOUNTER — Encounter (INDEPENDENT_AMBULATORY_CARE_PROVIDER_SITE_OTHER): Payer: BC Managed Care – PPO | Admitting: RHEUMATOLOGY

## 2014-09-28 ENCOUNTER — Ambulatory Visit (HOSPITAL_BASED_OUTPATIENT_CLINIC_OR_DEPARTMENT_OTHER): Payer: BC Managed Care – PPO | Admitting: RHEUMATOLOGY

## 2014-09-28 ENCOUNTER — Encounter (INDEPENDENT_AMBULATORY_CARE_PROVIDER_SITE_OTHER): Payer: Self-pay | Admitting: RHEUMATOLOGY

## 2014-09-28 ENCOUNTER — Ambulatory Visit
Admission: RE | Admit: 2014-09-28 | Discharge: 2014-09-28 | Disposition: A | Discharge status: Home / Self Care | Payer: BC Managed Care – PPO | Source: Ambulatory Visit | Attending: RHEUMATOLOGY | Admitting: RHEUMATOLOGY

## 2014-09-28 VITALS — BP 134/91 | HR 77 | Temp 97.1°F | Ht 69.37 in | Wt 228.6 lb

## 2014-09-28 DIAGNOSIS — Z23 Encounter for immunization: Secondary | ICD-10-CM | POA: Insufficient documentation

## 2014-09-28 DIAGNOSIS — L4050 Arthropathic psoriasis, unspecified: Secondary | ICD-10-CM

## 2014-09-28 LAB — CBC/DIFF
BASOPHILS: 1 %
BASOS ABS: 0.056 10*3/uL (ref 0.000–0.200)
EOS ABS: 0.17 10*3/uL (ref 0.000–0.500)
EOSINOPHIL: 3 %
HCT: 49.9 % — ABNORMAL HIGH (ref 36.7–47.0)
HGB: 16.5 g/dL — ABNORMAL HIGH (ref 12.5–16.3)
LYMPHOCYTES: 39 %
LYMPHS ABS: 2.663 10*3/uL (ref 1.000–4.800)
MCH: 29.7 pg (ref 27.4–33.0)
MCHC: 33 g/dL (ref 32.5–35.8)
MCV: 89.9 fL (ref 78–100)
MONOCYTES: 11 %
MONOS ABS: 0.744 10*3/uL (ref 0.300–1.000)
MPV: 7.6 fL (ref 7.5–11.5)
PLATELET COUNT: 263 10*3/uL (ref 140–450)
PMN ABS: 3.213 10*3/uL (ref 1.500–7.700)
PMN'S: 46 %
RBC: 5.55 MIL/uL (ref 4.06–5.63)
RDW: 13.6 % (ref 12.0–15.0)
WBC: 6.8 THOU/uL (ref 3.5–11.0)

## 2014-09-28 LAB — AST (SGOT): AST (SGOT): 25 U/L (ref 8–48)

## 2014-09-28 LAB — CREATININE
CREATININE: 1.2 mg/dL (ref 0.62–1.27)
ESTIMATED GLOMERULAR FILTRATION RATE: >59 mL/min/{1.73_m2} (ref >59)
ESTIMATED GLOMERULAR FILTRATION RATE: >59 mL/min/{1.73_m2} (ref >59)

## 2014-09-28 NOTE — Progress Notes (Signed)
Subjective  Chase [name] is a 48 y.o. year old male who presents for Psoriatic Arthropathy   to clinic.    Doing well no trouble qo wk humira.  Some neck pain since weather change.    Current Outpatient Prescriptions   Medication Sig    Adalimumab (HUMIRA) 40 mg/0.8 mL Subcutaneous Syringe Kit 40 mg by Subcutaneous route Every 14 days Every two weeks    amlodipine (NORVASC) 10 mg Oral Tablet take 1 Tab by mouth Once a day.    enalapril (VASOTEC) 20 mg Oral Tablet take 1 Tab by mouth Once a day.    Fenofibrate Micronized (LOFIBRA) 134 mg Oral Capsule Take 134 mg by mouth Every morning with breakfast    glipiZIDE (GLUCOTROL) 10 mg Oral Tablet Take 10 mg by mouth Every morning before breakfast Take 30 minutes before meals    metoprolol succinate (TOPROL-XL) 50 mg Tb24 take 1 Tab by mouth Once a day.     omeprazole (PRILOSEC) 20 mg Oral Capsule, Delayed Release(E.C.) Take 20 mg by mouth Once a day    pioglitazone (ACTOS) 30 mg Tab take 1 Tab by mouth Once a day.     Pravastatin (PRAVACHOL) 20 mg Oral Tablet Take 20 mg by mouth Every evening       Objective  Vitals: BP 134/91 mmHg   Pulse 77   Temp(Src) 36.2 C (97.1 F) (Tympanic)   Ht 1.762 m (5' 9.37")   Wt 103.7 kg (228 lb 9.9 oz)   BMI 33.40 kg/m2   SpO2 98%  General:  no acute distress and no cane  Eyes: Conjunctiva clear.  Joints:  Full ROM and no synovitis of the upper or lower extremities except as noted.   Good ROM neck.   Skin: No rashes or lesions.   No psoriasis  Neurologic: No weakness, tremor, or atrophy across any joint  Psychiatric: normal affect, behavior, memory, thought content, judgement, and speech.  Assessment/Plan  No diagnosis found.    No orders of the defined types were placed in this encounter.     Doing good.   RTC 4 mos.  Labs cont same  Inda Castle, MD 09/28/2014, 10:45

## 2014-09-28 NOTE — Student (Addendum)
Subjective  Chase [name] is a 48 y.o. year old male who presents for Psoriatic Arthropathy   to clinic. He says he  Has no  New skin lesions, psoriasis is well controlled by current regimen. He complains of neck pain for the last 2 weeks. He believes it is due to weather as it usually worsens this time of year. No other complaints.    Current Outpatient Prescriptions   Medication Sig   . Adalimumab (HUMIRA) 40 mg/0.8 mL Subcutaneous Syringe Kit 40 mg by Subcutaneous route Every 14 days Every two weeks   . amlodipine (NORVASC) 10 mg Oral Tablet take 1 Tab by mouth Once a day.   . enalapril (VASOTEC) 20 mg Oral Tablet take 1 Tab by mouth Once a day.   . Fenofibrate Micronized (LOFIBRA) 134 mg Oral Capsule Take 134 mg by mouth Every morning with breakfast   . glipiZIDE (GLUCOTROL) 10 mg Oral Tablet Take 10 mg by mouth Every morning before breakfast Take 30 minutes before meals   . metoprolol succinate (TOPROL-XL) 50 mg Tb24 take 1 Tab by mouth Once a day.    Marland Kitchen omeprazole (PRILOSEC) 20 mg Oral Capsule, Delayed Release(E.C.) Take 20 mg by mouth Once a day   . pioglitazone (ACTOS) 30 mg Tab take 1 Tab by mouth Once a day.    . Pravastatin (PRAVACHOL) 20 mg Oral Tablet Take 20 mg by mouth Every evening     Review of Systems -   Eyes: negative for visual disturbance  Ears, nose, mouth, throat, and face: negative for hearing loss, earaches and sore throat  Respiratory: negative for pleurisy/chest pain or dyspnea on exertion  Cardiovascular: negative for none, chest pain and dyspnea  Gastrointestinal: negative for nausea, vomiting, diarrhea and constipation  Genitourinary:negative for hematuria  Musculoskeletal:positive for neck painsee hpi, no other joint pain  Objective  Vitals: BP 134/91 mmHg  Pulse 77  Temp(Src) 36.2 C (97.1 F) (Tympanic)  Ht 1.762 m (5' 9.37")  Wt 103.7 kg (228 lb 9.9 oz)  BMI 33.40 kg/m2  SpO2 98%  General:  no acute distress  Eyes: Conjunctiva clear.  HENT:Mouth without ulceration.  Neck:  no adenopathy  Cardiovascular: RRR without murmur.  Lungs: clear to auscultation bilaterally.   Abdomen: soft, non-tender  Joints:  Without swelling or nodules noted. Neck full ROM but some pain with motion  Skin: No rashes or lesions  Assessment/Plan  1. Psoriatic Arthritis     - Continue current regimen. RTC in 4 mo.    Orders Placed This Encounter   . Influenza Vaccine IM (Admin)   . CBC/DIFF   . CREATININE   . Ast (Sgot)     2. Healthcare maintenance- Patient would like influenza vaccine. Order placed.  Maurene Capes, MED STUDENT 09/28/2014, 10:56        HX exam seen with MS. Humphrey ROS reviewed.     BP 134/91 mmHg  Pulse 77  Temp(Src) 36.2 C (97.1 F) (Tympanic)  Ht 1.762 m (5' 9.37")  Wt 103.7 kg (228 lb 9.9 oz)  BMI 33.40 kg/m2  SpO2 98%        ICD-10-CM    1. Psoriatic Arthritis L40.50 CBC/DIFF     CREATININE     AST (SGOT)     INFLUENZA VACCINE IM (ADMIN)       Inda Castle, MD

## 2014-09-28 NOTE — Progress Notes (Signed)
Immunization administered     Name Date Dose VIS Date Route    INFLUENZA VACCINE IM 09/28/2014 0.5 mL 06/22/2014 Intramuscular    Site: Left deltoid    Given By: Gilmer Mor, MA    Manufacturer: [employer]    Lot: [place], MA  09/28/2014, 11:00

## 2014-12-26 ENCOUNTER — Other Ambulatory Visit (INDEPENDENT_AMBULATORY_CARE_PROVIDER_SITE_OTHER): Payer: Self-pay | Admitting: RHEUMATOLOGY

## 2015-02-08 ENCOUNTER — Ambulatory Visit (HOSPITAL_BASED_OUTPATIENT_CLINIC_OR_DEPARTMENT_OTHER): Payer: BC Managed Care – PPO | Admitting: RHEUMATOLOGY

## 2015-02-08 ENCOUNTER — Encounter (INDEPENDENT_AMBULATORY_CARE_PROVIDER_SITE_OTHER): Payer: Self-pay | Admitting: RHEUMATOLOGY

## 2015-02-08 ENCOUNTER — Ambulatory Visit
Admission: RE | Admit: 2015-02-08 | Discharge: 2015-02-08 | Disposition: A | Discharge status: Home / Self Care | Payer: BC Managed Care – PPO | Source: Ambulatory Visit | Attending: RHEUMATOLOGY | Admitting: RHEUMATOLOGY

## 2015-02-08 VITALS — BP 120/84 | HR 62 | Temp 96.8°F | Ht 69.37 in | Wt 230.8 lb

## 2015-02-08 DIAGNOSIS — L4050 Arthropathic psoriasis, unspecified: Secondary | ICD-10-CM

## 2015-02-08 DIAGNOSIS — Z7960 Long term (current) use of unspecified immunomodulators and immunosuppressants: Secondary | ICD-10-CM

## 2015-02-08 DIAGNOSIS — Z79899 Other long term (current) drug therapy: Secondary | ICD-10-CM

## 2015-02-08 LAB — CREATININE
CREATININE: 1.37 mg/dL (ref 0.62–1.27)
ESTIMATED GLOMERULAR FILTRATION RATE: 59 mL/min/{1.73_m2} — ABNORMAL LOW (ref >59)

## 2015-02-08 LAB — CBC
HCT: 47.8 % — ABNORMAL HIGH (ref 36.7–47.0)
HGB: 16.3 g/dL (ref 12.5–16.3)
MCH: 30.2 pg (ref 27.4–33.0)
MCHC: 34.1 g/dL (ref 32.5–35.8)
MCV: 88.6 fL (ref 78–100)
MPV: 7.3 fL — ABNORMAL LOW (ref 7.5–11.5)
PLATELET COUNT: 293 10*3/uL (ref 140–450)
RBC: 5.39 MIL/uL (ref 4.06–5.63)
RDW: 13.6 % (ref 12.0–15.0)
WBC: 6.7 10*3/uL (ref 3.5–11.0)

## 2015-02-08 LAB — CREATININE WITH EGFR: CREATININE: 1.37 mg/dL — ABNORMAL HIGH (ref 0.62–1.27)

## 2015-02-08 LAB — AST (SGOT): AST (SGOT): 28 U/L (ref 8–48)

## 2015-02-08 NOTE — Progress Notes (Addendum)
Subjective  Chase [name] is a 49 y.o. year old male who presents for Psoriatic Arthritis   to clinic.        Today Chase [name] reports that he continues to do well on Humira q every other week..  Tolerating injections.  Denies pain and only reports occasional swelling in his hands when the weather is humid.  He reports minimal morning stiffness and denies gelling.  He has no rashes.      Current Outpatient Prescriptions   Medication Sig    amlodipine (NORVASC) 10 mg Oral Tablet take 1 Tab by mouth Once a day.    enalapril (VASOTEC) 20 mg Oral Tablet take 1 Tab by mouth Once a day.    Fenofibrate Micronized (LOFIBRA) 134 mg Oral Capsule Take 134 mg by mouth Every morning with breakfast    glipiZIDE (GLUCOTROL) 10 mg Oral Tablet Take 10 mg by mouth Every morning before breakfast Take 30 minutes before meals    HUMIRA 40 mg/0.8 mL Subcutaneous Syringe Kit INJECT 40 MG UNDER THE SKIN EVERY 14 DAYS (EVERY 2 WEEKS)    metoprolol succinate (TOPROL-XL) 50 mg Tb24 take 1 Tab by mouth Once a day.     omeprazole (PRILOSEC) 20 mg Oral Capsule, Delayed Release(E.C.) Take 20 mg by mouth Once a day    pioglitazone (ACTOS) 30 mg Tab take 1 Tab by mouth Once a day.     Pravastatin (PRAVACHOL) 20 mg Oral Tablet Take 20 mg by mouth Every evening       Objective  Vitals: BP 120/84 mmHg   Pulse 62   Temp(Src) 36 C (96.8 F) (Tympanic)   Ht 1.762 m (5' 9.37")   Wt 104.7 kg (230 lb 13.2 oz)   BMI 33.72 kg/m2   SpO2 99%   Vital signs reviewed  General:  Appears stated age, no acute distress  Eyes: Conjunctiva clear, pupils equally round, Sclera non-icteric  Heart: RRR, no m/r/g  Lungs: CTAB, no wheezes  Abd: + BS, soft, nontender  Joints:  Full ROM and no synovitis of the upper or lower extremities.   Skin: No rashes or lesions.   No psoriasis  Neurologic: No weakness, tremor, or atrophy across any joint  Psychiatric: normal affect, behavior, memory, thought content, judgement, and speech.    Assessment/Plan  1. Long-term  use of immunosuppressant medication    2. Psoriatic arthritis        Chase [name] is a 49 yo with psoriatic arthritis on Humira presenting for follow up, subjectively doing well with benign exam.  - Will repeat CBC, Creatinine and SGOT for monitoring  - Discussed increasing interval between injections with patient.  He is amenable to trying dosing every 3 weeks.  If his symptoms return, he understands to resume dosing at every other week.      Orders Placed This Encounter    Cbc    Ast (Sgot)    Creatinine     RTC 4 Months    Alda Ponder, MD 50/25/2016 13:43 *1375  PGY2, Department of Internal Medicine  Inda Castle, MD  50/28/2016, 10:31

## 2015-07-16 ENCOUNTER — Encounter (INDEPENDENT_AMBULATORY_CARE_PROVIDER_SITE_OTHER): Payer: BC Managed Care – PPO | Admitting: RHEUMATOLOGY

## 2015-07-25 ENCOUNTER — Other Ambulatory Visit (INDEPENDENT_AMBULATORY_CARE_PROVIDER_SITE_OTHER): Payer: Self-pay

## 2015-08-23 DIAGNOSIS — I10 Essential (primary) hypertension: Secondary | ICD-10-CM

## 2015-08-23 DIAGNOSIS — K219 Gastro-esophageal reflux disease without esophagitis: Secondary | ICD-10-CM

## 2015-08-23 DIAGNOSIS — N528 Other male erectile dysfunction: Secondary | ICD-10-CM

## 2015-08-23 DIAGNOSIS — I2510 Atherosclerotic heart disease of native coronary artery without angina pectoris: Secondary | ICD-10-CM

## 2015-08-23 DIAGNOSIS — E785 Hyperlipidemia, unspecified: Secondary | ICD-10-CM

## 2015-08-23 DIAGNOSIS — E119 Type 2 diabetes mellitus without complications: Secondary | ICD-10-CM

## 2015-08-23 DIAGNOSIS — Z23 Encounter for immunization: Secondary | ICD-10-CM

## 2015-08-23 DIAGNOSIS — L4050 Arthropathic psoriasis, unspecified: Secondary | ICD-10-CM

## 2015-10-07 ENCOUNTER — Ambulatory Visit (INDEPENDENT_AMBULATORY_CARE_PROVIDER_SITE_OTHER): Payer: BC Managed Care – PPO | Admitting: Rheumatology

## 2015-10-07 ENCOUNTER — Ambulatory Visit: Payer: BC Managed Care – PPO | Attending: RHEUMATOLOGY | Admitting: RHEUMATOLOGY

## 2015-10-07 ENCOUNTER — Encounter (INDEPENDENT_AMBULATORY_CARE_PROVIDER_SITE_OTHER): Payer: Self-pay | Admitting: RHEUMATOLOGY

## 2015-10-07 VITALS — BP 126/72 | HR 70 | Temp 97.7°F | Ht 68.94 in | Wt 215.2 lb

## 2015-10-07 DIAGNOSIS — L4050 Arthropathic psoriasis, unspecified: Secondary | ICD-10-CM | POA: Insufficient documentation

## 2015-10-07 DIAGNOSIS — Z79899 Other long term (current) drug therapy: Secondary | ICD-10-CM | POA: Insufficient documentation

## 2015-10-07 LAB — CBC WITH DIFF
BASOPHIL #: 0.05 x10ˆ3/uL (ref 0.00–0.20)
BASOPHIL %: 1 %
EOSINOPHIL #: 0.13 x10ˆ3/uL (ref 0.00–0.50)
EOSINOPHIL %: 2 %
HCT: 48.4 % — ABNORMAL HIGH (ref 36.7–47.0)
HGB: 16.4 g/dL — ABNORMAL HIGH (ref 12.5–16.3)
LYMPHOCYTE #: 2.09 x10ˆ3/uL (ref 1.00–4.80)
LYMPHOCYTE %: 31 %
MCH: 29.6 pg (ref 27.4–33.0)
MCHC: 33.8 g/dL (ref 32.5–35.8)
MCV: 87.5 fL (ref 78.0–100.0)
MONOCYTE #: 0.56 x10ˆ3/uL (ref 0.30–1.00)
MONOCYTE %: 8 %
MPV: 7.7 fL (ref 7.5–11.5)
NEUTROPHIL #: 3.82 x10ˆ3/uL (ref 1.50–7.70)
NEUTROPHIL %: 57 %
PLATELETS: 240 x10ˆ3/uL (ref 140–450)
RBC: 5.54 x10ˆ6/uL (ref 4.06–5.63)
RDW: 13.7 % (ref 12.0–15.0)
WBC: 6.7 x10ˆ3/uL (ref 3.5–11.0)

## 2015-10-07 LAB — CREATININE WITH EGFR
CREATININE: 1.08 mg/dL (ref 0.62–1.27)
ESTIMATED GFR: >59 mL/min/1.73mˆ2 (ref >59)

## 2015-10-07 LAB — AST (SGOT): AST (SGOT): 21 U/L (ref 8–48)

## 2015-10-07 NOTE — Progress Notes (Signed)
Subjective  Chase [name] is a 49 y.o. year old male who presents for Psoriatic Arthropathy   to clinic.    On bhumira q 2-3v wks.   Doing well. Going to gym.  Working for [employer].     Current Outpatient Prescriptions   Medication Sig    amlodipine (NORVASC) 10 mg Oral Tablet take 1 Tab by mouth Once a day.    enalapril (VASOTEC) 20 mg Oral Tablet take 1 Tab by mouth Once a day.    Fenofibrate Micronized (LOFIBRA) 134 mg Oral Capsule Take 134 mg by mouth Every morning with breakfast    glipiZIDE (GLUCOTROL) 10 mg Oral Tablet Take 10 mg by mouth Every morning before breakfast Take 30 minutes before meals    HUMIRA 40 mg/0.8 mL Subcutaneous Syringe Kit INJECT 40 MG UNDER THE SKIN EVERY 14 DAYS    metoprolol succinate (TOPROL-XL) 50 mg Tb24 take 1 Tab by mouth Once a day.     omeprazole (PRILOSEC) 20 mg Oral Capsule, Delayed Release(E.C.) Take 20 mg by mouth Once a day    pioglitazone (ACTOS) 30 mg Tab take 1 Tab by mouth Once a day.     Pravastatin (PRAVACHOL) 20 mg Oral Tablet Take 20 mg by mouth Every evening       Objective  Vitals: BP 126/72 mmHg   Pulse 70   Temp(Src) 36.5 C (97.7 F) (Tympanic)   Ht 1.751 m (5' 8.94")   Wt 97.6 kg (215 lb 2.7 oz)   BMI 31.83 kg/m2   SpO2 99%  General:  no acute distress  Eyes: Conjunctiva clear.  Joints:  Full ROM and no synovitis of the upper or lower extremities  Skin: good  Neurologic: No weakness, tremor, or atrophy across any joint  Assessment/Plan  No diagnosis found.    No orders of the defined types were placed in this encounter.     Doing ok.   Cont same labs. RTC 4 mos.  Inda Castle, MD 10/07/2015, 14:06

## 2015-10-28 DIAGNOSIS — E1165 Type 2 diabetes mellitus with hyperglycemia: Secondary | ICD-10-CM

## 2015-11-29 DIAGNOSIS — E1165 Type 2 diabetes mellitus with hyperglycemia: Secondary | ICD-10-CM

## 2015-11-29 DIAGNOSIS — I2510 Atherosclerotic heart disease of native coronary artery without angina pectoris: Secondary | ICD-10-CM

## 2015-11-29 DIAGNOSIS — N528 Other male erectile dysfunction: Secondary | ICD-10-CM

## 2015-11-29 DIAGNOSIS — L4050 Arthropathic psoriasis, unspecified: Secondary | ICD-10-CM

## 2015-11-29 DIAGNOSIS — I10 Essential (primary) hypertension: Secondary | ICD-10-CM

## 2015-11-29 DIAGNOSIS — K219 Gastro-esophageal reflux disease without esophagitis: Secondary | ICD-10-CM

## 2015-11-29 DIAGNOSIS — E784 Other hyperlipidemia: Secondary | ICD-10-CM

## 2015-12-14 DIAGNOSIS — D126 Benign neoplasm of colon, unspecified: Secondary | ICD-10-CM

## 2015-12-14 DIAGNOSIS — K635 Polyp of colon: Secondary | ICD-10-CM

## 2015-12-14 HISTORY — DX: Polyp of colon: K63.5

## 2015-12-14 HISTORY — DX: Benign neoplasm of colon, unspecified: D12.6

## 2015-12-14 HISTORY — PX: COLONOSCOPY: SHX174

## 2016-02-17 ENCOUNTER — Encounter (INDEPENDENT_AMBULATORY_CARE_PROVIDER_SITE_OTHER): Payer: BC Managed Care – PPO | Admitting: RHEUMATOLOGY

## 2016-03-27 DIAGNOSIS — Z1211 Encounter for screening for malignant neoplasm of colon: Secondary | ICD-10-CM

## 2016-03-27 DIAGNOSIS — Z125 Encounter for screening for malignant neoplasm of prostate: Secondary | ICD-10-CM

## 2016-03-27 DIAGNOSIS — L4050 Arthropathic psoriasis, unspecified: Secondary | ICD-10-CM

## 2016-03-27 DIAGNOSIS — N528 Other male erectile dysfunction: Secondary | ICD-10-CM

## 2016-03-27 DIAGNOSIS — I2510 Atherosclerotic heart disease of native coronary artery without angina pectoris: Secondary | ICD-10-CM

## 2016-03-27 DIAGNOSIS — I10 Essential (primary) hypertension: Secondary | ICD-10-CM

## 2016-03-27 DIAGNOSIS — E119 Type 2 diabetes mellitus without complications: Secondary | ICD-10-CM

## 2016-03-27 DIAGNOSIS — E784 Other hyperlipidemia: Secondary | ICD-10-CM

## 2016-03-27 DIAGNOSIS — K219 Gastro-esophageal reflux disease without esophagitis: Secondary | ICD-10-CM

## 2016-04-30 ENCOUNTER — Other Ambulatory Visit (INDEPENDENT_AMBULATORY_CARE_PROVIDER_SITE_OTHER): Payer: Self-pay | Admitting: Specialist

## 2016-05-15 ENCOUNTER — Encounter (INDEPENDENT_AMBULATORY_CARE_PROVIDER_SITE_OTHER): Payer: Self-pay | Admitting: Specialist

## 2016-05-21 ENCOUNTER — Ambulatory Visit (INDEPENDENT_AMBULATORY_CARE_PROVIDER_SITE_OTHER): Payer: BC Managed Care – PPO | Admitting: Specialist

## 2016-05-21 ENCOUNTER — Encounter (INDEPENDENT_AMBULATORY_CARE_PROVIDER_SITE_OTHER): Payer: Self-pay | Admitting: Specialist

## 2016-05-21 VITALS — BP 128/80 | Ht 69.0 in | Wt 207.4 lb

## 2016-05-21 DIAGNOSIS — F17290 Nicotine dependence, other tobacco product, uncomplicated: Secondary | ICD-10-CM

## 2016-05-21 DIAGNOSIS — Z6830 Body mass index (BMI) 30.0-30.9, adult: Secondary | ICD-10-CM

## 2016-05-21 DIAGNOSIS — Z1211 Encounter for screening for malignant neoplasm of colon: Principal | ICD-10-CM

## 2016-05-21 MED ORDER — SODIUM,POTASSIUM,MAG SULFATES 17.5 GRAM-3.13 GRAM-1.6 GRAM ORAL SOLN
1.00 | Freq: Two times a day (BID) | ORAL | 0 refills | Status: DC
Start: 2016-05-21 — End: 2016-10-23

## 2016-05-21 NOTE — Patient Instructions (Signed)
Pre-operative Education for [place] and Bowel Surgery  I reviewed the following with the patient/family:   Discussion of Diet, handout given to patient to follow prior to procedure.   Medication reviewed with patient, verbal instructions and handout was given to   patient indicating Medication to continue and Medication that must be stopped.   Bowel Prep Instructions reviewed and a paper handout was included in the patient  packet.   Date and Time of Procedure and arrive time discussed with patient and included in  Packet   Pre-op testing and time frame to have test completed were discussed and written on order form in patient packet.

## 2016-05-21 NOTE — H&P (Addendum)
[facility] History & Physical    Patient: Chase [name]  D.O.B.: [birth year]  MRN# [id number]  Date of Service: 05/21/2016  REFERRING PROVIDER: Elease Hashimoto    HPI  Chase [name] is a 50 y.o. male who was seen today for Colonoscopy. Has never had previous colonoscopy. Denies change in bowel habits, rectal bleeding, weight loss or abdominal pain. Negative family history of colon or rectal cancer.     Past Medical History:   Diagnosis Date    Arthritis with psoriasis ([place])     CAD (coronary artery disease)     Diabetes mellitus, type 2 ([place]) 03/19/2009    Fracture of distal end of tibia     left    Fracture of distal fibula     left    GERD (gastroesophageal reflux disease)     Hypertension     Urinary calculus, unspecified     Renal stones          Past Surgical History:   Procedure Laterality Date    HX CARPAL TUNNEL RELEASE  1990    bil    HX HEART CATHETERIZATION  11/09    HX HERNIA REPAIR  1970    LITHOTRIPSY  6301    PB LAP UMBILICAL HERNIA REPAIR  2009          Current Outpatient Prescriptions   Medication Sig    amlodipine (NORVASC) 10 mg Oral Tablet take 1 Tab by mouth Once a day.    enalapril (VASOTEC) 20 mg Oral Tablet take 1 Tab by mouth Once a day.    Fenofibrate Micronized (LOFIBRA) 134 mg Oral Capsule Take 134 mg by mouth Every morning with breakfast    glipiZIDE (GLUCOTROL) 10 mg Oral Tablet Take 10 mg by mouth Every morning before breakfast Take 30 minutes before meals    HUMIRA 40 mg/0.8 mL Subcutaneous Syringe Kit INJECT 40 MG UNDER THE SKIN EVERY 14 DAYS    metoprolol succinate (TOPROL-XL) 50 mg Tb24 take 1 Tab by mouth Once a day.     omeprazole (PRILOSEC) 20 mg Oral Capsule, Delayed Release(E.C.) Take 20 mg by mouth Once a day    pioglitazone (ACTOS) 30 mg Tab take 1 Tab by mouth Once a day.     Pravastatin (PRAVACHOL) 20 mg Oral Tablet Take 20 mg by mouth Every evening    Sodium,Potassium,&Mag Sulfates (SUPREP BOWEL PREP KIT) 17.5-3.13-1.6 gram Oral Recon  Soln Take 1 Bottle by mouth Every 12 hours     No Known Allergies   Social History   Substance Use Topics    Smoking status: Passive Smoke Exposure - Never Smoker    Smokeless tobacco: Current User     Types: Snuff    Alcohol use Yes      Comment: occassionally     History   Drug Use No      Family History   Problem Relation Age of Onset    Diabetes Mother     Hypertension Mother     Diabetes Father     Cancer Father              ROS:  General: Denies fever, chills, or any changes in weight.  EENT: Denies blurry vision, hearing loss.  Cardiovascular: Denies chest pain or palpitations.  Respiratory: Denies SOB, cough.  Gastrointestinal: Denies abdominal pain, N/V, constipation, or diarrhea. Denies any change in bowel movements or blood in stool.  GU: Denies dysuria, change in urinary habits, or hematuria.  Musculoskeletal: Denies weakness or joint pain. Denies trouble moving extremities.  Neurological: Denies HA, dizziness.  Hematologic: Denies hx of bleeding disorders or blood clots.  Psychiatric: Denies changes in mood, anxiety, or depression.    Objective:  BP 128/80   Ht 1.753 m (5' 9" )   Wt 94.1 kg (207 lb 6.4 oz)   BMI 30.63 kg/m2    Physical Exam:  Constitutional:  Alert, healthy, well nourished, no acute distress and not in pain.  HEENT: Normocephalic, atraumatic.   Neck: Trachea midline, supple.  Pulmonary: Normal effort and rate observed.  Cardiovascular: Regular rate and rhythm.  Abdomen: Abdomen soft and supple; No tenderness. Non-distended; No masses present. Bowel sounds normal and active in all 4 quadrants.  Extremities: No peripheral edema; No cyanosis or clubbing of nails.  Musculoskeletal: Normal muscle strength and tone of all four extremities.  Skin: No rashes or lesions present; Warm and dry. No jaundice.  Psychiatric: Normal mood and affect; Judgement and thought content normal.      Assessment:  ENCOUNTER DIAGNOSES     ICD-10-CM   1. Encounter for screening colonoscopy Z12.11         Plan:   Orders Placed This Encounter   Procedures    LOWER ENDOSCOPY ([facility])     Standing Status:   Future     Standing Expiration Date:   05/21/2017     Order Specific Question:   Type of process requested     Answer:   COLONOSCOPY     Order Specific Question:   Serv. Provider     Answer:   Christel Mormon     Order Specific Question:   Stop anticoagulation therapy     Answer:   Other     Order Specific Question:   Medical Clearance     Answer:   N/A     Order Specific Question:   Type of anesthesia     Answer:   MAC    ECG W INTERP (CLINIC ONLY)     Standing Status:   Future     Standing Expiration Date:   05/21/2017    CBC/DIFF     Standing Status:   Future     Number of Occurrences:   1     Standing Expiration Date:   07/22/2017    COMPREHENSIVE METABOLIC PANEL, NON-FASTING     Standing Status:   Future     Number of Occurrences:   1     Standing Expiration Date:   07/22/2017     Medication Orders   Medications    Sodium,Potassium,&Mag Sulfates (SUPREP BOWEL PREP KIT) 17.5-3.13-1.6 gram Oral Recon Soln     Sig: Take 1 Bottle by mouth Every 12 hours     Dispense:  1 Bottle     Refill:  0       Patient for colonoscopy    #1 I explained the risks of colonoscopy to the patient including bleeding, infection, missed polyps, inability to attain the cecum and need for barium enema, possibility of perforation and need for emergency surgery etc. The patient agrees to proceed.     #2 The patient will have preoperative bowel prep and laboratory studies.       Return if symptoms worsen or fail to improve.    He was given the opportunity to ask questions and those questions were appropriately answered. He agreed with the treatment plan and is encouraged to call with any additional questions or concerns.  Isabella Bowens, SCRIBE   I am scribing for, and in the presence of Dr. Melida Quitter for services provided on 05/21/2016.  Isabella Bowens, SCRIBE     I personally performed the services described in this documentation, as  scribed  in my presence, and it is both accurate  and complete.    Melida Quitter, MD

## 2016-07-07 ENCOUNTER — Other Ambulatory Visit (HOSPITAL_BASED_OUTPATIENT_CLINIC_OR_DEPARTMENT_OTHER): Payer: Self-pay | Admitting: RHEUMATOLOGY

## 2016-07-07 NOTE — Telephone Encounter (Signed)
Last labs 10/07/15  Last office visit 10/07/15  F/u visit 10/05/16  Duffy Rhody, [place]  07/07/2016, 08:22

## 2016-07-24 ENCOUNTER — Emergency Department (HOSPITAL_COMMUNITY): Payer: Self-pay | Admitting: Emergency Medicine

## 2016-07-31 DIAGNOSIS — I10 Essential (primary) hypertension: Secondary | ICD-10-CM

## 2016-07-31 DIAGNOSIS — K219 Gastro-esophageal reflux disease without esophagitis: Secondary | ICD-10-CM

## 2016-07-31 DIAGNOSIS — L4050 Arthropathic psoriasis, unspecified: Secondary | ICD-10-CM

## 2016-07-31 DIAGNOSIS — E784 Other hyperlipidemia: Secondary | ICD-10-CM

## 2016-07-31 DIAGNOSIS — I2510 Atherosclerotic heart disease of native coronary artery without angina pectoris: Secondary | ICD-10-CM

## 2016-07-31 DIAGNOSIS — E1165 Type 2 diabetes mellitus with hyperglycemia: Secondary | ICD-10-CM

## 2016-08-04 ENCOUNTER — Encounter (INDEPENDENT_AMBULATORY_CARE_PROVIDER_SITE_OTHER): Payer: BC Managed Care – PPO | Admitting: RHEUMATOLOGY

## 2016-09-05 ENCOUNTER — Other Ambulatory Visit: Payer: Self-pay

## 2016-09-15 ENCOUNTER — Other Ambulatory Visit (HOSPITAL_BASED_OUTPATIENT_CLINIC_OR_DEPARTMENT_OTHER): Payer: Self-pay | Admitting: RHEUMATOLOGY

## 2016-10-05 ENCOUNTER — Encounter (HOSPITAL_BASED_OUTPATIENT_CLINIC_OR_DEPARTMENT_OTHER): Payer: BC Managed Care – PPO | Admitting: RHEUMATOLOGY

## 2016-10-05 ENCOUNTER — Encounter (INDEPENDENT_AMBULATORY_CARE_PROVIDER_SITE_OTHER): Payer: BC Managed Care – PPO | Admitting: RHEUMATOLOGY

## 2016-10-14 ENCOUNTER — Ambulatory Visit (HOSPITAL_COMMUNITY): Payer: Self-pay | Admitting: Family Medicine

## 2016-10-15 ENCOUNTER — Other Ambulatory Visit (HOSPITAL_BASED_OUTPATIENT_CLINIC_OR_DEPARTMENT_OTHER): Payer: Self-pay

## 2016-10-15 ENCOUNTER — Other Ambulatory Visit (HOSPITAL_BASED_OUTPATIENT_CLINIC_OR_DEPARTMENT_OTHER): Payer: Self-pay | Admitting: Specialist

## 2016-10-15 DIAGNOSIS — K219 Gastro-esophageal reflux disease without esophagitis: Secondary | ICD-10-CM

## 2016-10-19 ENCOUNTER — Encounter (HOSPITAL_COMMUNITY): Payer: Self-pay

## 2016-10-23 ENCOUNTER — Ambulatory Visit (HOSPITAL_COMMUNITY): Payer: BC Managed Care – PPO | Admitting: Certified Registered"

## 2016-10-23 ENCOUNTER — Other Ambulatory Visit (HOSPITAL_COMMUNITY): Payer: Self-pay | Admitting: Specialist

## 2016-10-23 ENCOUNTER — Encounter (HOSPITAL_COMMUNITY): Payer: Self-pay

## 2016-10-23 ENCOUNTER — Inpatient Hospital Stay
Admission: RE | Admit: 2016-10-23 | Discharge: 2016-10-23 | Disposition: A | Discharge status: Home / Self Care | Payer: BC Managed Care – PPO | Source: Ambulatory Visit | Attending: Specialist | Admitting: Specialist

## 2016-10-23 ENCOUNTER — Encounter (HOSPITAL_COMMUNITY)
Admission: RE | Disposition: A | Discharge status: Home / Self Care | Payer: Self-pay | Source: Ambulatory Visit | Attending: Specialist

## 2016-10-23 DIAGNOSIS — D124 Benign neoplasm of descending colon: Secondary | ICD-10-CM

## 2016-10-23 DIAGNOSIS — Z1211 Encounter for screening for malignant neoplasm of colon: Secondary | ICD-10-CM

## 2016-10-23 DIAGNOSIS — I10 Essential (primary) hypertension: Secondary | ICD-10-CM | POA: Insufficient documentation

## 2016-10-23 DIAGNOSIS — K635 Polyp of colon: Secondary | ICD-10-CM

## 2016-10-23 DIAGNOSIS — E119 Type 2 diabetes mellitus without complications: Secondary | ICD-10-CM | POA: Insufficient documentation

## 2016-10-23 DIAGNOSIS — K219 Gastro-esophageal reflux disease without esophagitis: Secondary | ICD-10-CM | POA: Insufficient documentation

## 2016-10-23 DIAGNOSIS — E669 Obesity, unspecified: Secondary | ICD-10-CM | POA: Insufficient documentation

## 2016-10-23 DIAGNOSIS — I2510 Atherosclerotic heart disease of native coronary artery without angina pectoris: Secondary | ICD-10-CM | POA: Insufficient documentation

## 2016-10-23 DIAGNOSIS — K640 First degree hemorrhoids: Secondary | ICD-10-CM | POA: Insufficient documentation

## 2016-10-23 DIAGNOSIS — L409 Psoriasis, unspecified: Secondary | ICD-10-CM | POA: Insufficient documentation

## 2016-10-23 DIAGNOSIS — F17220 Nicotine dependence, chewing tobacco, uncomplicated: Secondary | ICD-10-CM | POA: Insufficient documentation

## 2016-10-23 DIAGNOSIS — Z6830 Body mass index (BMI) 30.0-30.9, adult: Secondary | ICD-10-CM | POA: Insufficient documentation

## 2016-10-23 HISTORY — DX: Calculus of kidney: N20.0

## 2016-10-23 HISTORY — DX: Type 2 diabetes mellitus without complications: E11.9

## 2016-10-23 LAB — POC BLOOD GLUCOSE (RESULTS): GLUCOSE, POC: 124 mg/dL — ABNORMAL HIGH (ref 70–110)

## 2016-10-23 SURGERY — COLONOSCOPY
Anesthesia: IV General | Wound class: Clean Contaminated Wounds-The respiratory, GI, Genital, or urinary

## 2016-10-23 MED ORDER — PROPOFOL 10 MG/ML IV - CHI
INTRAVENOUS | Status: DC | PRN
Start: 2016-10-23 — End: 2016-10-23
  Administered 2016-10-23: 0 ug/kg/min via INTRAVENOUS
  Administered 2016-10-23: 150 ug/kg/min via INTRAVENOUS

## 2016-10-23 MED ORDER — SODIUM CHLORIDE 0.9 % (FLUSH) INJECTION SYRINGE
3.0000 mL | INJECTION | Freq: Three times a day (TID) | INTRAMUSCULAR | Status: DC
Start: 2016-10-23 — End: 2016-10-23
  Administered 2016-10-23: 3 mL

## 2016-10-23 MED ORDER — LIDOCAINE (PF) 100 MG/5 ML (2 %) INTRAVENOUS SYRINGE
INJECTION | Freq: Once | INTRAVENOUS | Status: DC | PRN
Start: 2016-10-23 — End: 2016-10-23
  Administered 2016-10-23: 50 mg via INTRAVENOUS

## 2016-10-23 MED ORDER — LACTATED RINGERS INTRAVENOUS SOLUTION
INTRAVENOUS | Status: DC
Start: 2016-10-23 — End: 2016-10-23

## 2016-10-23 MED ADMIN — acetaminophen 325 mg tablet: INTRAVENOUS | @ 10:00:00

## 2016-10-23 SURGICAL SUPPLY — 37 items
BRUSH CYTO 1.5MM 140CM BLT TIP ERG HNDL ATO STP SHEATH CLBR STRL DISP PTFE BRONCHSCP (CANNULA) IMPLANT
BRUSH CYTOLOGY BRONCHOSCOPY_ERG HNDL ATO STP SHEATH CLBR (CANNULA)
CATH ELHMST INJ GLD PRB 7FR 25_GA .24MM 210CM BIPO RND DIST (BALLOON)
CATH ELHMST INJ GLD PROBE 7FR 25GA .24MM 210CM BIPOLAR RND DIST TIP STD CONN INTGR DISP 2.8MM MN WRK (BALLOON) IMPLANT
CLIP HMST MR CONDITIONAL BRD CATH ROT CONTROL KNOB NO SHEATH RSL 360 235CM 2.8MM 11MM OPN (SURGICAL INSTRUMENTS) IMPLANT
DEVICE HEMOSTASIS CLIP 360_235CM RESOLUTION (INSTRUMENTS)
DISCONTINUED USE ITEM 328361 - JELLY LUB EZ BCTRST H2O SOL NG_RS FLPTP TUBE STRL 2OZ LF (MISCELLANEOUS PT CARE ITEMS) ×1 IMPLANT
FORCEP BIOPSY (INSTRUMENTS)
FORCEP BIOPSY (SURGICAL INSTRUMENTS) IMPLANT
FORCEPS BIOPSY HOT 240CM 2.2MM RJ 4 +2.8MM DISP (INSTRUMENTS)
FORCEPS BIOPSY HOT 240CM 2.2MM RJ 4 +2.8MM DISPO (SURGICAL INSTRUMENTS) IMPLANT
FORCEPS BIOPSY HOT 240CM 2.2MM_RJ 4 +2.8MM DISP (INSTRUMENTS)
FORCEPS BIOPSY NEEDLE 160CM 1.8MM RJ 4 DISP YW 2MM WRK CHNL GSPED (SURGICAL INSTRUMENTS) IMPLANT
FORCEPS BIOPSY NEEDLE 160CM 1._8MM RJ 4 PED 2+ MM DISP (INSTRUMENTS)
FORCEPS BIOPSY NEEDLE 240CM RJ 4 JMB (SURGICAL INSTRUMENTS) ×1 IMPLANT
FORCEPS BIOPSY NEEDLE 240CM RJ_4 JMB DISP (INSTRUMENTS) ×1
GW ENDOSCOPIC .035IN 260CM DREAMWIRE ANG RX EGLD NITINOL BIL STRL DISP (ENDOSCOPIC SUPPLIES) IMPLANT
JELLY LUB EZ BCTRST H2O SOL NG_RS FLPTP TUBE STRL 2OZ LF (MISCELLANEOUS PT CARE ITEMS) ×2
KIT CAN MDVC ASCP SPECI CNVRT_NONST LF (TEST)
LIGATOR 2.8MM 8.6-11.5MM SSS7 ESOPH 1 STNG MLT BAND HNDL STRL DISP ENDOS HMSTS LF (GI LAB SUPPLIES) IMPLANT
LIGATOR 2.8MM 8.6-11.5MM SSS7_ESOPH 1 STNG MLT BAND ERG HNDL (GI LAB SUPPLIES)
NEEDLE SCLRTX 25GA 2.3MM BVL STRL DISP STAR CATH INTJCT 4MM 240CM (NEEDLES & SYRINGE SUPPLIES) IMPLANT
NEEDLE SCLRTX 25GA 2.3MM BVL S_TRL DISP STAR CATH INTJCT 4MM (NEEDLES & SYRINGE SUPPLIES)
NET SPEC RETR 230CM 2.5MM RTHNT STD SHEATH 6X3CM NONST LF  DISP (Dilators) IMPLANT
NET SPEC RETR 230CM 2.5MM RTHN_T STD SHTH 6X3CM NONST LF (Dilators)
PROBE ESURG 220CM 2.3MM FIAPC FLXB STR FIRE STRL DISP (CAUTERY SUPPLIES) IMPLANT
PROBE ESURG 220CM 2.3MM FIAPC_FLXB STR FIRE ARGON PLAS COAG (CAUTERY SUPPLIES)
SNARE 230CM 2.5MM 3CM PLPK PLP_CTM OVL ENDOS 6CM STRL DISP (INT)
SNARE 230CM 2.5MM 3CM PLPK ROTR RTHNT ENDOS NONST LF  DISP (INT) IMPLANT
SNARE LRG OVL MED STF ENDOS OL_MPS DISP (INSTRUMENTS ENDOMECHANICAL)
SNARE MED OVAL 240CM 2.4MM CAPTIVATR STF ENDOS PLYP 27MM DISP (UROLOGICAL SUPPLIES) IMPLANT
SNARE MED OVL 240CM 2.4MM CAPT_IVATR STF ENDOS PLYP 27MM DISP (UROLOGICAL SUPPLIES)
SOCK CAN MEDIVAC ASCP SPECI CNVRT NONST LF (TEST) IMPLANT
TRAP LL CONN REM SCRN INSTREAM SPECI POLYPROP DISP (INSTRUMENTS)
TRAP LL CONN REM SCRN INSTREAM SPECI POLYPROP DISP (SURGICAL INSTRUMENTS) IMPLANT
TRAY GASTRIC LAV 36IN 34FR ARGYLE EDLICH MONOJECT LRG PVC 4 EYE CLS END GRAD SYRG TRNSPR 140CC NONST (TRAY) IMPLANT
TRAY GASTRIC LAV 36IN 34FR ARG_YLE EDLICH MONOJECT LRG PVC 4 (TRAY)

## 2016-10-23 NOTE — Anesthesia Postprocedure Evaluation (Signed)
Anesthesia Post Op Evaluation    Patient: Chase [name]  Procedure(s) Performed:Procedure(s):  COLONOSCOPY  Last Vitals:Temperature: 35.8 C (96.4 F) (10/23/16 0829)  Heart Rate: 84 (10/23/16 1059)  BP (Non-Invasive): 107/67 (10/23/16 1059)  Respiratory Rate: 19 (10/23/16 1059)  SpO2-1: 98 % (10/23/16 1059)  Patient is sufficiently recovered from the effects of anesthesia to participate in the evaluation and has returned to their pre-procedure level.  Patient location during evaluation: bedside   Post-procedure handoff checklist completed    Patient participation: complete - patient participated  Level of consciousness: awake and alert and responsive to verbal stimuli  Pain score: 0  Pain management: adequate  Airway patency: patent  Anesthetic complications: no  Cardiovascular status: acceptable  Respiratory status: acceptable  Hydration status: acceptable  Patient post-procedure temperature: Pt Normothermic   PONV Status: Absent

## 2016-10-23 NOTE — Anesthesia Preprocedure Evaluation (Signed)
ANESTHESIA PRE-OP EVALUATION  Review of Systems     anesthesia history negative    patient summary reviewed          Pulmonary  negative pulmonary ROS   Cardiovascular    Hypertension, well controlled, CAD No peripheral edema Exercise Tolerance: good       GI/Hepatic/Renal   GERD, well controlled    Endo/Other    diabetes (pre diabetic - diet control) and obesity     Neuro/Psych/MS  negative neuro/psych ROS  Cancer  negative hematology/oncology ROS                              Physical Assessment      Patient summary reviewed   Airway       Mallampati: II    TM distance: <3 FB    Neck ROM: full  Mouth Opening: good.  Facial hair  Beard        Dental       Dentition intact             Pulmonary    Breath sounds clear to auscultation  (-) no rhonchi, no decreased breath sounds, no wheezes, no rales and no stridor     Cardiovascular    Rhythm: regular  Rate: Normal  (-) no friction rub, carotid bruit is not present, no peripheral edema and no murmur     Other findings            Plan  Planned anesthesia type: TIVA    ASA 2     Intravenous induction   Anesthetic plan and risks discussed with patient.     Anesthesia issues/risks discussed are: PONV, Intraoperative Awareness/ Recall, Difficult Airway, Aspiration and Sore Throat.        Patient's NPO status is appropriate for Anesthesia.           Plan discussed with physician.

## 2016-10-23 NOTE — H&P (Signed)
H&P  Encounter Date: 10/23/2016  Melida Quitter, [facility]   History & Physical    Patient: Chase [name]  D.O.B.: 10-13-1966  MRN# [id number]  Date of Service: 10/23/2016  REFERRING PROVIDER: Elease [name]    HPI  Chase [name] is a 50 y.o. male who was seen today for Colonoscopy. Has never had previous colonoscopy. Denies change in bowel habits, rectal bleeding, weight loss or abdominal pain. Negative family history of colon or rectal cancer.          Past Medical History:   Diagnosis Date    Arthritis with psoriasis (Chase [name])     CAD (coronary artery disease)     Diabetes mellitus, type 2 (Chase [name]) 03/19/2009    Fracture of distal end of tibia     left    Fracture of distal fibula     left    GERD (gastroesophageal reflux disease)     Hypertension     Urinary calculus, unspecified     Renal stones               Past Surgical History:   Procedure Laterality Date    HX CARPAL TUNNEL RELEASE  1990    bil    HX HEART CATHETERIZATION  11/09    HX HERNIA REPAIR  1970    LITHOTRIPSY  2841    PB LAP UMBILICAL HERNIA REPAIR  2009              Current Outpatient Prescriptions   Medication Sig    amlodipine (NORVASC) 10 mg Oral Tablet take 1 Tab by mouth Once a day.    enalapril (VASOTEC) 20 mg Oral Tablet take 1 Tab by mouth Once a day.    Fenofibrate Micronized (LOFIBRA) 134 mg Oral Capsule Take 134 mg by mouth Every morning with breakfast    glipiZIDE (GLUCOTROL) 10 mg Oral Tablet Take 10 mg by mouth Every morning before breakfast Take 30 minutes before meals    HUMIRA 40 mg/0.8 mL Subcutaneous Syringe Kit INJECT 40 MG UNDER THE SKIN EVERY 14 DAYS    metoprolol succinate (TOPROL-XL) 50 mg Tb24 take 1 Tab by mouth Once a day.     omeprazole (PRILOSEC) 20 mg Oral Capsule, Delayed Release(E.C.) Take 20 mg by mouth Once a day    pioglitazone (ACTOS) 30 mg Tab take 1 Tab by mouth Once a day.     Pravastatin (PRAVACHOL) 20 mg Oral Tablet Take 20 mg by  mouth Every evening    Sodium,Potassium,&Mag Sulfates (SUPREP BOWEL PREP KIT) 17.5-3.13-1.6 gram Oral Recon Soln Take 1 Bottle by mouth Every 12 hours     No Known Allergies           Social History   Substance Use Topics    Smoking status: Passive Smoke Exposure - Never Smoker    Smokeless tobacco: Current User     Types: Snuff    Alcohol use Yes       Comment: occassionally         History   Drug Use No            Family History   Problem Relation Age of Onset    Diabetes Mother     Hypertension Mother     Diabetes Father     Cancer Father            ROS:  General: Denies fever, chills,  or any changes in weight.  EENT: Denies blurry vision, hearing loss.  Cardiovascular: Denies chest pain or palpitations.  Respiratory: Denies SOB, cough.  Gastrointestinal: Denies abdominal pain, N/V, constipation, or diarrhea. Denies any change in bowel movements or blood in stool.  GU: Denies dysuria, change in urinary habits, or hematuria.  Musculoskeletal: Denies weakness or joint pain. Denies trouble moving extremities.  Neurological: Denies HA, dizziness.  Hematologic: Denies hx of bleeding disorders or blood clots.  Psychiatric: Denies changes in mood, anxiety, or depression.    Objective:  BP 128/80   Ht 1.753 m (5' 9" )   Wt 94.1 kg (207 lb 6.4 oz)   BMI 30.63 kg/m2    Physical Exam:  Constitutional:  Alert, healthy, well nourished, no acute distress and not in pain.  HEENT: Normocephalic, atraumatic.   Neck: Trachea midline, supple.  Pulmonary: Normal effort and rate observed.  Cardiovascular: Regular rate and rhythm.  Abdomen: Abdomen soft and supple; No tenderness. Non-distended; No masses present. Bowel sounds normal and active in all 4 quadrants.  Extremities: No peripheral edema; No cyanosis or clubbing of nails.  Musculoskeletal: Normal muscle strength and tone of all four extremities.  Skin: No rashes or lesions present; Warm and dry. No jaundice.  Psychiatric: Normal mood and affect;  Judgement and thought content normal.      Assessment:       ENCOUNTER DIAGNOSES     ICD-10-CM   1. Encounter for screening colonoscopy Z12.11        Plan:         Orders Placed This Encounter   Procedures    LOWER ENDOSCOPY ([facility])     Standing Status:   Future     Standing Expiration Date:   05/21/2017     Order Specific Question:   Type of process requested     Answer:   COLONOSCOPY     Order Specific Question:   Serv. Provider     Answer:   Christel Mormon     Order Specific Question:   Stop anticoagulation therapy     Answer:   Other     Order Specific Question:   Medical Clearance     Answer:   N/A     Order Specific Question:   Type of anesthesia     Answer:   MAC    ECG W INTERP (CLINIC ONLY)     Standing Status:   Future     Standing Expiration Date:   05/21/2017    CBC/DIFF     Standing Status:   Future     Number of Occurrences:   1     Standing Expiration Date:   07/22/2017    COMPREHENSIVE METABOLIC PANEL, NON-FASTING     Standing Status:   Future     Number of Occurrences:   1     Standing Expiration Date:   07/22/2017          Medication Orders   Medications    Sodium,Potassium,&Mag Sulfates (SUPREP BOWEL PREP KIT) 17.5-3.13-1.6 gram Oral Recon Soln     Sig: Take 1 Bottle by mouth Every 12 hours     Dispense:  1 Bottle     Refill:  0       Patient for colonoscopy    #1 I explained the risks of colonoscopy to the patient including bleeding, infection, missed polyps, inability to attain the cecum and need for barium enema, possibility of perforation and need for emergency surgery etc. The  patient agrees to proceed.     #2 The patient will have preoperative bowel prep and laboratory studies.       Return if symptoms worsen or fail to improve.    He was given the opportunity to ask questions and those questions were appropriately answered. He agreed with the treatment plan and is encouraged to call with any additional questions or concerns.            I personally performed the services described in this documentation, and it is both accurate and complete.    Melida Quitter, MD

## 2016-10-26 ENCOUNTER — Ambulatory Visit (HOSPITAL_BASED_OUTPATIENT_CLINIC_OR_DEPARTMENT_OTHER): Payer: Self-pay | Admitting: Surgical

## 2016-10-26 NOTE — Telephone Encounter (Signed)
Regarding: Medical Record Request  ----- Message from Rema Jasmine sent at 10/26/2016  8:45 AM EST -----  Rheumatology,    Pt was wanting to be seen in a different clinic and wanted to know if their medical records could be sent to that office.  They said they need it faxed to [phone].  The pt wasn't sure if the number was their fax number or their phone number.  Please call the pt to advise.

## 2016-10-26 NOTE — Telephone Encounter (Signed)
Faxed last two office notes to number listed below. Russella Dar, RN  10/26/2016, 14:03

## 2016-10-27 ENCOUNTER — Encounter (HOSPITAL_BASED_OUTPATIENT_CLINIC_OR_DEPARTMENT_OTHER): Payer: BC Managed Care – PPO | Admitting: Surgical

## 2016-10-30 ENCOUNTER — Telehealth (HOSPITAL_BASED_OUTPATIENT_CLINIC_OR_DEPARTMENT_OTHER): Payer: Self-pay | Admitting: Specialist

## 2016-10-30 NOTE — Telephone Encounter (Signed)
Called patient with colonoscopy pathology results.  Recall to repeat in 3 years generated.

## 2016-12-07 DIAGNOSIS — E1122 Type 2 diabetes mellitus with diabetic chronic kidney disease: Secondary | ICD-10-CM | POA: Diagnosis not present

## 2016-12-07 DIAGNOSIS — I10 Essential (primary) hypertension: Secondary | ICD-10-CM | POA: Diagnosis not present

## 2016-12-07 DIAGNOSIS — E7800 Pure hypercholesterolemia, unspecified: Secondary | ICD-10-CM | POA: Diagnosis not present

## 2016-12-07 DIAGNOSIS — N181 Chronic kidney disease, stage 1: Secondary | ICD-10-CM | POA: Diagnosis not present

## 2017-01-05 ENCOUNTER — Encounter (INDEPENDENT_AMBULATORY_CARE_PROVIDER_SITE_OTHER): Payer: Self-pay | Admitting: Family Medicine

## 2017-01-28 ENCOUNTER — Other Ambulatory Visit (INDEPENDENT_AMBULATORY_CARE_PROVIDER_SITE_OTHER): Payer: Self-pay | Admitting: Family Medicine

## 2017-01-28 DIAGNOSIS — E785 Hyperlipidemia, unspecified: Secondary | ICD-10-CM

## 2017-02-12 ENCOUNTER — Telehealth (HOSPITAL_BASED_OUTPATIENT_CLINIC_OR_DEPARTMENT_OTHER): Payer: Self-pay | Admitting: Surgical

## 2017-02-12 ENCOUNTER — Encounter (HOSPITAL_BASED_OUTPATIENT_CLINIC_OR_DEPARTMENT_OTHER): Payer: Self-pay | Admitting: Surgical

## 2017-02-12 ENCOUNTER — Other Ambulatory Visit (INDEPENDENT_AMBULATORY_CARE_PROVIDER_SITE_OTHER): Payer: Self-pay | Admitting: Pharmacist

## 2017-02-12 ENCOUNTER — Ambulatory Visit: Payer: BC Managed Care – PPO | Attending: Rheumatology | Admitting: Surgical

## 2017-02-12 VITALS — BP 134/80 | HR 89 | Temp 98.3°F | Ht 69.02 in | Wt 215.4 lb

## 2017-02-12 DIAGNOSIS — L4050 Arthropathic psoriasis, unspecified: Secondary | ICD-10-CM | POA: Insufficient documentation

## 2017-02-12 DIAGNOSIS — Z6831 Body mass index (BMI) 31.0-31.9, adult: Secondary | ICD-10-CM

## 2017-02-12 DIAGNOSIS — Z79899 Other long term (current) drug therapy: Secondary | ICD-10-CM | POA: Insufficient documentation

## 2017-02-12 MED ORDER — ADALIMUMAB 40 MG/0.8 ML SUBCUTANEOUS SYRINGE KIT
INJECTION | SUBCUTANEOUS | 3 refills | Status: DC
Start: 2017-02-12 — End: 2017-02-12

## 2017-02-12 MED ORDER — ADALIMUMAB 40 MG/0.8 ML SUBCUTANEOUS SYRINGE KIT
INJECTION | SUBCUTANEOUS | 3 refills | Status: DC
Start: 2017-02-12 — End: 2018-02-18

## 2017-02-12 NOTE — Progress Notes (Signed)
Subjective  Chase [name] is a 51 y.o. year old male who presents for Psoriatic Arthropathy   to clinic. He was F/U with Dr Isabell Jarvis for psoriatic arthropathy and last saw him on October 07, 2015. He is on Humira every 21 days which he is stable on and tolerating it well. His arthritis and psoriasis has been stable.  He denies any morning stiffness. He reports knuckles swelling with hot weather. His last blood work with his PCP was late November-December, 2017. Follows closely with them every 3 months. Was on several diabetic medications but has decreased due to weight loss and exercise.     Current Outpatient Prescriptions   Medication Sig   . Adalimumab (HUMIRA) 40 mg/0.8 mL Subcutaneous Syringe Kit INJECT 40 MG UNDER THE SKIN EVERY 14 DAYS   . amlodipine (NORVASC) 10 mg Oral Tablet take 1 Tab by mouth Once a day.   . enalapril (VASOTEC) 20 mg Oral Tablet take 1 Tab by mouth Once a day.   . metoprolol succinate (TOPROL-XL) 50 mg Tb24 take 1 Tab by mouth Once a day.    . pravastatin (PRAVACHOL) 20 mg Oral Tablet TAKE 1 TABLET DAILY       Objective  Vitals: BP 134/80  Pulse 89  Temp 36.8 C (98.3 F) (Thermal Scan)   Ht 1.753 m (5' 9.02")  Wt 97.7 kg (215 lb 6.2 oz)  SpO2 98%  BMI 31.79 kg/m2  General: no acute distress  Eyes: Conjunctiva clear.  Cardiovascular: RRR without murmur.  Lungs: clear to auscultation bilaterally.   Joints: No synovitis  Skin: No facial rash      Assessment/Plan  1. Psoriatic Arthritis        1- Psoriatic Arthritis: stable, no active synovitis on exam  Continue on Humira every 21 days  I refilled his Humira today  Continue regular monitoring labs with his PCP  RTC in a year for F/U or sooner if needed    Orders Placed This Encounter   . Adalimumab (HUMIRA) 40 mg/0.8 mL Subcutaneous Syringe Kit     I am scribing for, and in the presence of, PA. Weghorst for services provided on 02/12/2017.  Ahmed Rito Ehrlich, SCRIBE   The patient was seen independently with co signing facutly present  in clinic   I personally performed the services described in this documentation, as scribed  in my presence, and it is both accurate  and complete.    [address] [place], [place]

## 2017-02-12 NOTE — Telephone Encounter (Signed)
-----   Message from [facility] sent at 02/12/2017 12:06 PM EDT -----  Regarding: Humira script  Benefits investigation completed on Humira, no PA is needed. Patient has been notified.  Patient has to fill with [facility]. Please send a new script when you can. Thanks, [employer]

## 2017-02-12 NOTE — Telephone Encounter (Signed)
Benefits investigation complete. Patient needs to fill with [facility]. No PA needed. Clinic notified to send new script to [facility].

## 2017-02-12 NOTE — Telephone Encounter (Signed)
Humira script received by [facility] on 02/12/2017.  Benefits investigation to be completed.

## 2017-02-16 ENCOUNTER — Encounter (INDEPENDENT_AMBULATORY_CARE_PROVIDER_SITE_OTHER): Payer: Self-pay | Admitting: Family Medicine

## 2017-02-16 DIAGNOSIS — Z125 Encounter for screening for malignant neoplasm of prostate: Secondary | ICD-10-CM | POA: Insufficient documentation

## 2017-02-16 DIAGNOSIS — Z1211 Encounter for screening for malignant neoplasm of colon: Secondary | ICD-10-CM | POA: Insufficient documentation

## 2017-02-19 ENCOUNTER — Other Ambulatory Visit: Payer: BC Managed Care – PPO | Attending: Family Medicine | Admitting: Family Medicine

## 2017-02-19 ENCOUNTER — Ambulatory Visit (INDEPENDENT_AMBULATORY_CARE_PROVIDER_SITE_OTHER): Payer: BC Managed Care – PPO | Admitting: Family Medicine

## 2017-02-19 ENCOUNTER — Encounter (INDEPENDENT_AMBULATORY_CARE_PROVIDER_SITE_OTHER): Payer: Self-pay | Admitting: Family Medicine

## 2017-02-19 VITALS — BP 138/80 | HR 85 | Temp 96.8°F | Resp 16 | Ht 69.0 in | Wt 210.0 lb

## 2017-02-19 DIAGNOSIS — Z6831 Body mass index (BMI) 31.0-31.9, adult: Secondary | ICD-10-CM

## 2017-02-19 DIAGNOSIS — I2510 Atherosclerotic heart disease of native coronary artery without angina pectoris: Secondary | ICD-10-CM

## 2017-02-19 DIAGNOSIS — K219 Gastro-esophageal reflux disease without esophagitis: Secondary | ICD-10-CM

## 2017-02-19 DIAGNOSIS — E7800 Pure hypercholesterolemia, unspecified: Secondary | ICD-10-CM

## 2017-02-19 DIAGNOSIS — R350 Frequency of micturition: Secondary | ICD-10-CM

## 2017-02-19 DIAGNOSIS — E1100 Type 2 diabetes mellitus with hyperosmolarity without nonketotic hyperglycemic-hyperosmolar coma (NKHHC): Secondary | ICD-10-CM

## 2017-02-19 DIAGNOSIS — Z125 Encounter for screening for malignant neoplasm of prostate: Secondary | ICD-10-CM

## 2017-02-19 DIAGNOSIS — Z1211 Encounter for screening for malignant neoplasm of colon: Secondary | ICD-10-CM

## 2017-02-19 DIAGNOSIS — L4050 Arthropathic psoriasis, unspecified: Secondary | ICD-10-CM

## 2017-02-19 DIAGNOSIS — I10 Essential (primary) hypertension: Secondary | ICD-10-CM | POA: Insufficient documentation

## 2017-02-19 LAB — COMPREHENSIVE METABOLIC PNL, FASTING
ALBUMIN: 4.7 g/dL (ref 3.5–4.8)
ALKALINE PHOSPHATASE: 65 U/L (ref 20–130)
ALT (SGPT): 34 U/L (ref 7–42)
ANION GAP: 10 mmol/L
ANION GAP: 10 mmol/L
AST (SGOT): 24 U/L (ref 12–35)
BILIRUBIN TOTAL: 1 mg/dL (ref 0.3–1.2)
BUN/CREA RATIO: 12
BUN: 13 mg/dL (ref 8–20)
CALCIUM: 9.7 mg/dL (ref 8.9–10.3)
CHLORIDE: 102 mmol/L (ref 101–111)
CO2 TOTAL: 25 mmol/L (ref 22–32)
CREATININE: 1.13 mg/dL (ref 0.6–1.2)
ESTIMATED GFR: >60 mL/min/1.73m?2
GLUCOSE: 139 mg/dL — ABNORMAL HIGH (ref 70–110)
GLUCOSE: 139 mg/dL — ABNORMAL HIGH (ref 70–110)
POTASSIUM: 4.6 mmol/L (ref 3.6–5.1)
PROTEIN TOTAL: 7.6 g/dL (ref 6.4–8.3)
SODIUM: 137 mmol/L (ref 136–144)

## 2017-02-19 LAB — POCT HGB A1C: POCT HGB A1C: 6.9 % — AB (ref 4–6)

## 2017-02-19 LAB — POCT URINE DIPSTICK
BLOOD: NEGATIVE
GLUCOSE: NEGATIVE
LEUKOCYTES: NEGATIVE
LEUKOCYTES: NEGATIVE
NITRITE: NEGATIVE
PH: 8.5
SPECIFIC GRAVITY: 1.005
SPECIFIC GRAVITY: 1.005
UROBILINOGEN: 0.2

## 2017-02-19 LAB — LIPID PANEL
CHOLESTEROL: 209 mg/dL — ABNORMAL HIGH (ref 0–199)
HDL CHOL: 52 mg/dL (ref 29–71)
LDL DIRECT: 139 mg/dL — ABNORMAL HIGH (ref 0–99)
LDL DIRECT: 139 mg/dL — ABNORMAL HIGH (ref 0–99)
TRIGLYCERIDES: 142 mg/dL (ref 0–199)
VLDL CALC: 28 mg/dL (ref 0–50)

## 2017-02-19 NOTE — Progress Notes (Signed)
[facility]  [address] [place] [address]  Dept Phone: [phone]  Dept Fax: [phone]    Chase [name]  [birth year]  [id number]    Date of Service: 02/19/2017 10:30 AM EDT    Chief complaint:   Chief Complaint   Patient presents with   . Follow Up 3 Months     pt not fasting   . Urinary Frequency     pt states it feels urgent but not alot of urine comes out       Subjective:     This is a case of a 51 y.o. year old male who comes in today for a routine follow up . The patient's chronic conditions are stable and unchanged . See Problem List for chronic conditions, which have been reviewed today.     He has been having some urinary complaints recently he said has been going on off and on for the past month.  He said he has a little bit of dysuria.  At times feels like he does not completely empty his bladder in its had some frequency of urination as well.      Current Outpatient Prescriptions   Medication Sig   . Adalimumab (HUMIRA) 40 mg/0.8 mL Subcutaneous Syringe Kit INJECT 40 MG UNDER THE SKIN EVERY 14 DAYS   . amlodipine (NORVASC) 10 mg Oral Tablet take 1 Tab by mouth Once a day.   . enalapril (VASOTEC) 20 mg Oral Tablet take 1 Tab by mouth Once a day.   Marland Kitchen glipiZIDE (GLUCOTROL) 5 mg Oral Tablet Take 5 mg by mouth Every morning before breakfast Take 30 minutes before meals   . metoprolol succinate (TOPROL-XL) 50 mg Tb24 take 1 Tab by mouth Once a day.    . pravastatin (PRAVACHOL) 20 mg Oral Tablet TAKE 1 TABLET DAILY     Past Medical History:   Diagnosis Date   . Arthritis with psoriasis ([place])    . CAD (coronary artery disease)    . Diabetes mellitus, type 2 ([facility]) 03/19/2009   . Fracture of distal end of tibia     left   . Fracture of distal fibula     left   . GERD (gastroesophageal reflux disease)    . Hypertension    . Kidney stones    . Type 2 diabetes mellitus ([place])    . Urinary calculus, unspecified     Renal stones         Past Surgical History:   Procedure Laterality  Date   . HX APPENDECTOMY     . HX CARPAL TUNNEL RELEASE  1990    bil   . HX HEART CATHETERIZATION  11/09   . HX HERNIA REPAIR  1970   . LITHOTRIPSY  2004   . PB LAP UMBILICAL HERNIA REPAIR  2009         Family Medical History     Problem Relation (Age of Onset)    Cancer Father    Diabetes Mother, Father    Hypertension Mother            Social History     Social History   . Marital status: Married     Spouse name: N/A   . Number of children: N/A   . Years of education: N/A     Social History Main Topics   . Smoking status: Passive Smoke Exposure - Never Smoker   . Smokeless tobacco: Current User  Types: Snuff   . Alcohol use Yes      Comment: occassionally   . Drug use: No   . Sexual activity: Not on file     Other Topics Concern   . Not on file     Social History Narrative           REVIEW OF SYSTEMS:  General: (-) fevers (-) chills. (-) weight loss. (-) fatigue.  Lymphatic: (-) palpable masses. (-) night sweats.  Heme: (-) easy bruising (-) bleeding. (-) recurrent infections.   HEENT. (-) vision changes (-) hearing changes. (-) dysphagia. (-) sore throat.   Heart: (-) chest pain. (-) palpitation. (-) orthopnea. (-) LE edema.   Lungs: (-) dyspnea (on exertion) (-) hemoptysis. (-) cough.   Abdomen: (-) poor appetite. (-) abdominal pain. (-) nausea (-) vomiting. (-) diarrhea. (-) constipation.   GU: (-) dysuria (-) Urgency. (-) Hematuria.   MS. (-) joint pain (-) ext swelling. (-) Back pain.   Dermatologic: (-) rashes. (-) pruritus.   Psychiatric: (-) Depression. (-) anxiety. (-) insomnia.   Neurologic: (-) headaches. (-) neuropathy. (-) weakness. (-) memory problems.              Objective:     BP 138/80  Pulse 85  Temp 36 C (96.8 F) (Tympanic)   Resp 16  Ht 1.753 m (_0 )  Wt 95.3 kg (210 lb)  SpO2 99%  BMI 31.01 kg/m2    General appearance: alert, oriented x 3, in his normal state, cooperative, not in apparent distress, appearing stated age   Integumentary:   Overall examination of the patient's skin  reveals- no rashes, no suspicious lesions, and no bruises. Normal coloration of skin. Normal skin moisture.  Head and Neck:  Normocephalic and atraumatic. No lesions or palpable masses. Neck is supple with full ROM. No bruit auscultated on the left or right. No nucchal rigidity. No lymphadenopathy. Thyroid:  Normal size and consistency, no palpable nodules, normal position and symmetric. Non tender.   Lungs: clear to auscultation bilaterally, respirations non-labored. No adventitious sounds.   Heart: regular rate and rhythm, S1, S2 normal, no murmur  Abdomen: soft, non-tender. Bowel sounds normal. No abnormal pulsations. No rigidity and no hepatosplenomegaly . Normal active bowel sounds in all quadrants.   Extremities: extremities normal, atraumatic, no cyanosis or edema, pulses intact in upper and lower extremities.  No varicose ulcers.    Rectum is normal without masses. Prostate normal in size; non-tender, soft, symmetric without nodules. Stool is guaiac negative.     Psych :  Patient is alert and oriented and cooperative with the exam. No evidence of hallucinations, delusions or homicidal/suicidal ideations.   Neuro:  Cranial Nerves 2-12 are grossly intact . Coordination is normal . Gait is normal.  No meningeal signs.     Assessment :       ICD-10-CM    1. Type 2 diabetes mellitus with hyperosmolarity without coma, without long-term current use of insulin (HCC) E11.00 POCT HGB A1C   2. Screening PSA (prostate specific antigen) Z12.5     03/27/2016 results 0.33   3. Screen for colon cancer Z12.11     10/23/2016 Dr. Christel Mormon repeat 3 years   4. Urine frequency R35.0 POCT Urine dipstick   5. Pure hypercholesterolemia E78.00    6. Essential hypertension I10    7. Coronary artery disease involving native coronary artery without angina pectoris I25.10    8. Gastroesophageal reflux disease without esophagitis K21.9  9. Psoriatic Arthritis L40.50          PLAN :     Office Visit on 02/19/2017   Component Date Value Ref  Range Status   . POCT HGB A1C 02/19/2017 6.9* 4 - 6 % Final    07/31/2016 results 6.1    . LEUKOCYTES 02/19/2017 neg   Final   . NITRITE 02/19/2017 neg   Final   . UROBILINOGEN 02/19/2017 0.2   Final   . PROTEIN 02/19/2017 trace   Final   . [place] 02/19/2017 8.5   Final   . BLOOD 02/19/2017 neg   Final   . SPECIFIC GRAVITY 02/19/2017 1.005   Final   . KETONE 02/19/2017 neg   Final   . BILIRUBIN 02/19/2017 small   Final   . GLUCOSE 02/19/2017 neg   Final       His blood sugars are up a little bit.  He said that he knows he has not been eating very well recently he has been on a golfing trip been has sort of let up on his diet.  He is going to stay work on his diet will continue his current medications.    Continue current treatment regimen. Patient is doing well. Patient is to call with any problems prior to next apppointment if needed.    Will draw fasting labs today.     I did a rectal exam today.  His prostate was difficult to reach, but did not feel particularly enlarged.  He is not quite due for screening PSA.  We had discussed maybe starting him on something for hypertrophy, but I am a little uncomfortable doing that at his age.  Upon further conversation with him today he says that he has been taking some supplements from [facility] over-the-counter.  Specifically he has taken a testosterone supplement at a high dose as well as protein supplements.  I told him I really would like for him to get off of the testosterone supplement right away and he agrees.  I am going to put in a referral to Urology for his symptoms we did do a urinalysis today and it was negative for any infection.  If his symptoms resolve after getting off the supplements he can let me know otherwise will have Urology evaluate him.    We discussed his tobacco use today.  He does smoke an occasional cigar and understands the need to quit.    BMI addressed: Advised on diet, weight loss, and exercise to reduce above normal BMI.  Tobacco cessation counseling  performed.   He smokes an occ cigar .             The patient was given ample opportunity to ask questions and those questions were answered to the patient's satisfaction. The patient was encouraged to be involved in their own care, and all diagnoses, medications, and medication side-effects were discussed.  A copy of the patient's medication list was printed and given to the patient. A good faith effort was made to reconcile the patient's medications.  The patient is aware that they are to contact me with any additional questions or concerns, or go to the ED in an emergency.         Health Maintenance Due   Topic Date Due   . Depression Screening  08/24/1977   . HIV Screening  08/24/1980   . Adult Tdap-Td (1 - Tdap) 08/24/1984     Health Maintenance   Topic Date Due   .  Depression Screening  08/24/1977   . HIV Screening  08/24/1980   . Adult Tdap-Td (1 - Tdap) 08/24/1984   . Influenza Vaccine (Season Ended) 07/17/2017   . Colonoscopy  10/23/2026   . Pneumococcal 19-64 Years Medium Risk  Completed                   This note was partially generated using [employer] Fluency Direct system, and there may be some incorrect words, spellings, and punctuation that were not noted in checking the note before saving.    Dwyane Luo, DO    [facility]  [address] [place] [address]  Dept Phone: [phone]  Dept Fax: [phone]

## 2017-02-23 ENCOUNTER — Other Ambulatory Visit (INDEPENDENT_AMBULATORY_CARE_PROVIDER_SITE_OTHER): Payer: Self-pay | Admitting: Family Medicine

## 2017-02-23 ENCOUNTER — Encounter (INDEPENDENT_AMBULATORY_CARE_PROVIDER_SITE_OTHER): Payer: Self-pay | Admitting: Family Medicine

## 2017-02-23 DIAGNOSIS — E7849 Other hyperlipidemia: Secondary | ICD-10-CM

## 2017-02-23 MED ORDER — PRAVASTATIN 40 MG TABLET
40.0000 mg | ORAL_TABLET | Freq: Every evening | ORAL | 1 refills | Status: DC
Start: 2017-02-23 — End: 2018-02-18

## 2017-02-23 NOTE — Telephone Encounter (Signed)
Patient agreeable to increasing Pravastatin to 40 mg.  [name] that he will start taking 2 of his 20 mg tablets.  Wants it sent to [facility].    [name], LPN

## 2017-02-26 ENCOUNTER — Ambulatory Visit (INDEPENDENT_AMBULATORY_CARE_PROVIDER_SITE_OTHER): Payer: Self-pay | Admitting: Family Medicine

## 2017-02-26 NOTE — Telephone Encounter (Signed)
Patient called and states that he has found a couple of lumps in his (L) testicle.  Said that he isn't in any pain but if he twists a certain way or if his pants bunch up in that area then he will have some pain.  Said that he would like to come in to see Dr. Madelin Headings.  [name] that he had a catheter in back in September for surgery.  Said that that was removed but that it still feels like it is in.  Explained to him that he needs to go to the ER but he said to ask Dr. Madelin Headings and that if that is what she feels needs done then he will go.    [name], LPN

## 2017-02-26 NOTE — Telephone Encounter (Signed)
Spoke with Dr. Madelin Headings whom states that she would like for patient to go to the ER for evaluation.  Said that it could be a tortune and that it can be serious.  Called patient back but received his voicemail asking him to go to the ER for evaluation and why.  Also requested that he call the office to let us know that he got this message.    [name], LPN

## 2017-03-01 ENCOUNTER — Ambulatory Visit (INDEPENDENT_AMBULATORY_CARE_PROVIDER_SITE_OTHER): Payer: Self-pay | Admitting: Family Medicine

## 2017-03-01 NOTE — Telephone Encounter (Signed)
Patient called and said that he was calling to ask if Dr. Madelin Headings would call in Cipro for him.  [name] that he passed a couple of kidney stones over the weekend.  Said that he use to see a doctor that told him when he passed a kidney stone that he needed to be on Cipro.  Said that he doesn't see him any longer.  Wanted to know if Dr. Madelin Headings will call in or if he has to come in to be seen?    Also said that he went to the ER last week and was seen because of the lumps in his testicle.  [name] that they told him that it is cysts.      [name], LPN

## 2017-03-02 NOTE — Telephone Encounter (Signed)
Patient called back and said that he appreciates the phone call.  Said that he did have to have lithotripsy the first time and had an infection.  Said that he doesn't need to come in and give a urine specimen.  Said that he did have some blood the first urination after passing the stones but said that he hasn't had any since.  Said that he doesn't feel that he has an infection.    [name], LPN

## 2017-03-02 NOTE — Telephone Encounter (Signed)
Called and left message for patient concerning what Dr. Madelin Headings said and advised.  Asked patient to please call the office.    [name], LPN

## 2017-03-30 ENCOUNTER — Other Ambulatory Visit (INDEPENDENT_AMBULATORY_CARE_PROVIDER_SITE_OTHER): Payer: Self-pay | Admitting: Family Medicine

## 2017-03-30 ENCOUNTER — Encounter (INDEPENDENT_AMBULATORY_CARE_PROVIDER_SITE_OTHER): Payer: Self-pay | Admitting: Family Medicine

## 2017-03-30 DIAGNOSIS — N528 Other male erectile dysfunction: Secondary | ICD-10-CM

## 2017-03-30 MED ORDER — SILDENAFIL 50 MG TABLET
50.0000 mg | ORAL_TABLET | ORAL | 5 refills | Status: DC | PRN
Start: 2017-03-30 — End: 2018-06-10

## 2017-03-30 NOTE — Telephone Encounter (Signed)
Patient called in and requesting a refill on Viagra to [facility] in [place].    [name], LPN

## 2017-03-30 NOTE — Telephone Encounter (Signed)
Called and notified patient that medication was sent to [facility] in [place].    [name], LPN

## 2017-04-07 ENCOUNTER — Other Ambulatory Visit (INDEPENDENT_AMBULATORY_CARE_PROVIDER_SITE_OTHER): Payer: Self-pay | Admitting: Family Medicine

## 2017-04-07 DIAGNOSIS — I10 Essential (primary) hypertension: Secondary | ICD-10-CM

## 2017-04-19 ENCOUNTER — Ambulatory Visit: Payer: BC Managed Care – PPO | Admitting: Medical

## 2017-04-19 ENCOUNTER — Encounter (HOSPITAL_BASED_OUTPATIENT_CLINIC_OR_DEPARTMENT_OTHER): Payer: Self-pay | Admitting: Medical

## 2017-04-19 VITALS — Ht 69.0 in | Wt 210.0 lb

## 2017-04-19 DIAGNOSIS — N4341 Spermatocele of epididymis, single: Secondary | ICD-10-CM

## 2017-04-19 DIAGNOSIS — N503 Cyst of epididymis: Secondary | ICD-10-CM

## 2017-04-19 DIAGNOSIS — Z6831 Body mass index (BMI) 31.0-31.9, adult: Secondary | ICD-10-CM

## 2017-04-19 DIAGNOSIS — N401 Enlarged prostate with lower urinary tract symptoms: Secondary | ICD-10-CM

## 2017-04-19 DIAGNOSIS — N400 Enlarged prostate without lower urinary tract symptoms: Secondary | ICD-10-CM

## 2017-04-19 DIAGNOSIS — R350 Frequency of micturition: Secondary | ICD-10-CM

## 2017-04-19 DIAGNOSIS — Z87442 Personal history of urinary calculi: Secondary | ICD-10-CM | POA: Insufficient documentation

## 2017-04-19 LAB — POCT URINE DIPSTICK
BILIRUBIN: NEGATIVE
BLOOD: NEGATIVE
GLUCOSE: NEGATIVE
KETONE: NEGATIVE
LEUKOCYTES: NEGATIVE
NITRITE: NEGATIVE
PH: 6
PROTEIN: NEGATIVE
SPECIFIC GRAVITY: 1.015
UROBILINOGEN: 0.2

## 2017-04-19 LAB — URINALYSIS, MACROSCOPIC
BILIRUBIN: NEGATIVE mg/dL
BLOOD: NEGATIVE mg/dL
KETONES: NEGATIVE mg/dL
LEUKOCYTES: NEGATIVE WBCs/uL
LEUKOCYTES: NEGATIVE WBCs/uL
NITRITE: NEGATIVE
PH: 6.5 (ref 5.0–7.0)
PROTEIN: NEGATIVE mg/dL
SPECIFIC GRAVITY: 1.024 (ref 1.010–1.025)
UROBILINOGEN: 0.2 mg/dL

## 2017-04-19 LAB — POCT PVR

## 2017-04-19 LAB — URINALYSIS, MICROSCOPIC

## 2017-04-19 NOTE — H&P (Signed)
[facility]  [place] [address]  Dept: [phone]  Dept Fax: [phone]      Follow up H&P    04/19/2017    Chase [name]  Date of Birth. Jul 08, 1966  MRN: [id number]  Referring Physician:  Elease Hashimoto, DO      Chief Complaint   Patient presents with   . Urinary Frequency   . Establish Care       History of present illness:50 y.o. male here today for urinary frequency. He is currently urinating every 2-3 hours with a moderate stream, nocturia x 0-1 , occ urgency, he denies dysuria, gross hematuria or urinary incontinence. The patient does usually feel as though the bladder empties well. The post void residual today in office is 12 cc. He denies any FH of PCa. He states he was recently told he had cysts on his left testicle. Upon examination , he has a small left spermatocele NTTP. Urine dip today is negative. Prior history of kidney stone requiring stents. He denies any recent imaging.         PMH:   Past Medical History:   Diagnosis Date   . Arthritis with psoriasis ([place])    . CAD (coronary artery disease)    . Diabetes mellitus, type 2 ([place]) 03/19/2009   . Fracture of distal end of tibia     left   . Fracture of distal fibula     left   . GERD (gastroesophageal reflux disease)    . Hypertension    . Kidney stones    . Type 2 diabetes mellitus ([place])    . Urinary calculus, unspecified     Renal stones       Past Surgical History:   Procedure Laterality Date   . HX APPENDECTOMY     . HX CARPAL TUNNEL RELEASE  1990    bil   . HX HEART CATHETERIZATION  11/09   . HX HERNIA REPAIR  1970   . LITHOTRIPSY  2004   . PB LAP UMBILICAL HERNIA REPAIR  2009       Current Outpatient Prescriptions   Medication Sig   . Adalimumab (HUMIRA) 40 mg/0.8 mL Subcutaneous Syringe Kit INJECT 40 MG UNDER THE SKIN EVERY 14 DAYS   . amLODIPine (NORVASC) 10 mg Oral Tablet TAKE 1 TABLET DAILY   . enalapril (VASOTEC) 20 mg Oral Tablet take 1 Tab by mouth Once a day.   Marland Kitchen glipiZIDE (GLUCOTROL) 5 mg Oral Tablet Take 10  mg by mouth Every morning before breakfast Take 30 minutes before meals    . metoprolol succinate (TOPROL-XL) 50 mg Tb24 take 1 Tab by mouth Once a day.    . pravastatin (PRAVACHOL) 40 mg Oral Tablet Take 1 Tab (40 mg total) by mouth Every evening (Patient taking differently: Take 20 mg by mouth Every evening )   . Sildenafil (VIAGRA) 50 mg Oral Tablet Take 1 Tab (50 mg total) by mouth Every 24 hours as needed     No Known Allergies  Social History     Social History   . Marital status: Married     Spouse name: N/A   . Number of children: N/A   . Years of education: N/A     Occupational History   . Not on file.     Social History Main Topics   . Smoking status: Passive Smoke Exposure - Never Smoker   . Smokeless tobacco: Former Systems developer     Types:  Snuff     Quit date: 02/22/2017   . Alcohol use Yes      Comment: occassionally   . Drug use: No   . Sexual activity: Not on file     Other Topics Concern   . Not on file     Social History Narrative     Family Medical History     Problem Relation (Age of Onset)    Cancer Father    Diabetes Mother, Father    Hypertension Mother              Review of systems:    As per history of present illness or negative for constitutional symptoms, ENT, cardiovascular, respiratory, gastrointestinal, genitourinary; musculoskeletal, skin and/or breasts, neurological, psychiatric, endocrine, hematologic, lymphatic, and allergic immunologic. Also, as per urology H&P forms scanned into the computer.    Physical examination:    Ht 1.753 m (5' 9" )  Wt 95.3 kg (210 lb)  BMI 31.01 kg/m2   General: Well-developed, well-nourished male in no apparent distress.  HEENT: Grossly normal.  Chest: Normal excursion.  Cardiac: Regular rate and rhythm.  Abdomen: Soft, without tenderness or mass.  GU examination: circumcised phallus, soft/smooth testes bilat, small left spermatocele NTTP  DRE: 20 gram smooth  Skin: Warm and dry.  Extremities: Moves all extremities.  Neurologic: Alert and oriented in three  spheres.    Labs:     Date # RBC's Culture Organism                         Results for orders placed or performed in visit on 04/19/17 (from the past 72 hour(s))   POCT URINE DIPSTICK    Collection Time: 04/19/17 12:00 AM   Result Value Ref Range    LEUKOCYTES neg     NITRITE neg     UROBILINOGEN 0.2     PROTEIN neg     PH 6.0     BLOOD neg     SPECIFIC GRAVITY 1.015     KETONE neg     BILIRUBIN neg     GLUCOSE neg    POCT PVR    Collection Time: 04/19/17 12:00 AM   Result Value Ref Range    URINE TOTAL VOLUME 032m          No results for input(s): PSA in the last 99999 hours.      Radiographic data:    No results found for this or any previous visit.        Procedures:    PVR=12 cc    Impression:    1. Benign prostatic hyperplasia, unspecified whether lower urinary tract symptoms present    2. Urine frequency    3. Epididymal cyst    4. History of kidney stones              Plan:    1. UA/CS today  2. PSA and KUB soon  3. Reassurance given regarding the left spermatocele  4. RTO 1 year with a PSA prior    Orders Placed This Encounter   . URINE CULTURE   . URINALYSIS, MACROSCOPIC AND MICROSCOPIC   . POCT URINE DIPSTICK   . POCT PVR         SGenia [facility]

## 2017-04-21 LAB — URINE CULTURE: URINE CULTURE: NO GROWTH

## 2017-05-27 ENCOUNTER — Other Ambulatory Visit (INDEPENDENT_AMBULATORY_CARE_PROVIDER_SITE_OTHER): Payer: Self-pay | Admitting: Family Medicine

## 2017-06-10 DIAGNOSIS — I10 Essential (primary) hypertension: Secondary | ICD-10-CM | POA: Diagnosis not present

## 2017-06-10 DIAGNOSIS — E7800 Pure hypercholesterolemia, unspecified: Secondary | ICD-10-CM | POA: Diagnosis not present

## 2017-06-10 DIAGNOSIS — E119 Type 2 diabetes mellitus without complications: Secondary | ICD-10-CM | POA: Diagnosis not present

## 2017-06-10 DIAGNOSIS — Z0000 Encounter for general adult medical examination without abnormal findings: Secondary | ICD-10-CM | POA: Diagnosis not present

## 2017-06-11 DIAGNOSIS — I10 Essential (primary) hypertension: Secondary | ICD-10-CM | POA: Diagnosis not present

## 2017-06-11 DIAGNOSIS — E7800 Pure hypercholesterolemia, unspecified: Secondary | ICD-10-CM | POA: Diagnosis not present

## 2017-06-11 DIAGNOSIS — Z125 Encounter for screening for malignant neoplasm of prostate: Secondary | ICD-10-CM | POA: Diagnosis not present

## 2017-06-29 DIAGNOSIS — R109 Unspecified abdominal pain: Secondary | ICD-10-CM | POA: Diagnosis not present

## 2017-08-20 ENCOUNTER — Other Ambulatory Visit: Payer: BC Managed Care – PPO | Attending: Family Medicine | Admitting: Family Medicine

## 2017-08-20 ENCOUNTER — Ambulatory Visit (INDEPENDENT_AMBULATORY_CARE_PROVIDER_SITE_OTHER): Payer: BC Managed Care – PPO | Admitting: Family Medicine

## 2017-08-20 ENCOUNTER — Encounter (INDEPENDENT_AMBULATORY_CARE_PROVIDER_SITE_OTHER): Payer: Self-pay | Admitting: Family Medicine

## 2017-08-20 VITALS — BP 116/74 | HR 73 | Temp 96.3°F | Resp 16

## 2017-08-20 DIAGNOSIS — I10 Essential (primary) hypertension: Secondary | ICD-10-CM

## 2017-08-20 DIAGNOSIS — I2510 Atherosclerotic heart disease of native coronary artery without angina pectoris: Secondary | ICD-10-CM

## 2017-08-20 DIAGNOSIS — K219 Gastro-esophageal reflux disease without esophagitis: Secondary | ICD-10-CM

## 2017-08-20 DIAGNOSIS — E119 Type 2 diabetes mellitus without complications: Secondary | ICD-10-CM | POA: Insufficient documentation

## 2017-08-20 DIAGNOSIS — M25511 Pain in right shoulder: Secondary | ICD-10-CM

## 2017-08-20 DIAGNOSIS — Z125 Encounter for screening for malignant neoplasm of prostate: Secondary | ICD-10-CM

## 2017-08-20 DIAGNOSIS — Z1211 Encounter for screening for malignant neoplasm of colon: Secondary | ICD-10-CM

## 2017-08-20 DIAGNOSIS — E7800 Pure hypercholesterolemia, unspecified: Secondary | ICD-10-CM

## 2017-08-20 LAB — COMPREHENSIVE METABOLIC PNL, FASTING
ALBUMIN: 4.4 g/dL (ref 3.2–4.6)
ALKALINE PHOSPHATASE: 68 U/L (ref 20–130)
ALT (SGPT): 30 U/L (ref <=52)
ANION GAP: 6 mmol/L
AST (SGOT): 18 U/L (ref <=35)
BILIRUBIN TOTAL: 0.8 mg/dL (ref 0.3–1.2)
BILIRUBIN TOTAL: 0.8 mg/dL (ref 0.3–1.2)
BUN/CREA RATIO: 15
BUN: 15 mg/dL (ref 10–25)
CALCIUM: 9.9 mg/dL (ref 8.2–10.2)
CHLORIDE: 102 mmol/L (ref 98–111)
CO2 TOTAL: 28 mmol/L (ref 21–35)
CREATININE: 1.02 mg/dL (ref <=1.30)
CREATININE: 1.02 mg/dL (ref <=1.30)
ESTIMATED GFR: >60 mL/min/1.73mˆ2
GLUCOSE: 166 mg/dL — ABNORMAL HIGH (ref 70–110)
POTASSIUM: 4.9 mmol/L (ref 3.5–5.0)
PROTEIN TOTAL: 7.1 g/dL (ref 6.0–8.3)
SODIUM: 136 mmol/L (ref 135–145)

## 2017-08-20 LAB — MICROALBUMIN/CREATININE RATIO, URINE, RANDOM
CREATININE RANDOM URINE: 227 mg/dL
MICROALBUMIN RANDOM URINE: 2.6 mg/dL — ABNORMAL HIGH (ref 0.0–1.9)
MICROALBUMIN/CREATININE RATIO RANDOM URINE: 11.5 mg/g

## 2017-08-20 LAB — LIPID PANEL
CHOLESTEROL: 180 mg/dL (ref 0–199)
HDL CHOL: 44 mg/dL (ref >40)
LDL DIRECT: 105 mg/dL — ABNORMAL HIGH (ref 0–99)
TRIGLYCERIDES: 307 mg/dL — ABNORMAL HIGH (ref 0–199)
VLDL CALC: 61 mg/dL — ABNORMAL HIGH (ref 0–50)

## 2017-08-20 LAB — POCT HGB A1C: POCT HGB A1C: 8 % — AB (ref 4–6)

## 2017-08-20 NOTE — Progress Notes (Signed)
[address] [place] [address]  Dept Phone: [phone]  Dept Fax: [phone]    Chase [name]  Feb 17, 1966  [id number]    Date of Service: 08/20/2017 10:00 AM EDT    Chief complaint:   Chief Complaint   Patient presents with    Check Up     Patient states he is here today for a 52 month follow-up. States he quit eating snuff and thinks his A1C might be up bc hes been eating sweets to curb the nicotine craving. Patient is currently fasting.       Subjective:     This is a case of a 51 y.o. year old male who comes in today for a routine follow up . The patient's chronic conditions are stable and unchanged . See Problem List for chronic conditions, which have been reviewed today.       Current Outpatient Prescriptions   Medication Sig    Adalimumab (HUMIRA) 40 mg/0.8 mL Subcutaneous Syringe Kit INJECT 40 MG UNDER THE SKIN EVERY 14 DAYS    amLODIPine (NORVASC) 10 mg Oral Tablet TAKE 1 TABLET DAILY    enalapril (VASOTEC) 20 mg Oral Tablet Take 1 tab. Po daily    glipiZIDE (GLUCOTROL) 5 mg Oral Tablet Take 10 mg by mouth Every morning before breakfast Take 30 minutes before meals     metoprolol succinate (TOPROL-XL) 50 mg Tb24 take 1 Tab by mouth Once a day.     pravastatin (PRAVACHOL) 40 mg Oral Tablet Take 1 Tab (40 mg total) by mouth Every evening (Patient taking differently: Take 20 mg by mouth Every evening )    Sildenafil (VIAGRA) 50 mg Oral Tablet Take 1 Tab (50 mg total) by mouth Every 24 hours as needed     Patient Active Problem List   Diagnosis    Psoriatic Arthritis    Essential hypertension    Gastroesophageal reflux disease without esophagitis    Urinary calculus, unspecified    Fracture of distal end of tibia    Fracture of distal fibula    Coronary artery disease involving native coronary artery without angina pectoris    Diabetes mellitus, type 2 (CMS HCC)    Back pain    Pure hypercholesterolemia    Worsening headaches    Screening PSA  (prostate specific antigen)    Screen for colon cancer    History of kidney stones     Past Surgical History:   Procedure Laterality Date    HX APPENDECTOMY      HX CARPAL TUNNEL RELEASE  1990    bil    HX HEART CATHETERIZATION  11/09    HX HERNIA REPAIR  1970    LITHOTRIPSY  8921    PB LAP UMBILICAL HERNIA REPAIR  2009         Family Medical History:     Problem Relation (Age of Onset)    Cancer Father    Diabetes Mother, Father    Hypertension Mother            Social History     Social History    Marital status: Married     Spouse name: N/A    Number of children: N/A    Years of education: N/A     Social History Main Topics    Smoking status: Passive Smoke Exposure - Never Smoker    Smokeless tobacco: Former Systems developer     Types: Snuff  Quit date: 02/22/2017    Alcohol use Yes      Comment: occassionally    Drug use: No    Sexual activity: Not on file     Other Topics Concern    Not on file     Social History Narrative           REVIEW OF SYSTEMS:  General: (-) fevers (-) chills. (-) weight loss. (-) fatigue.  Lymphatic: (-) palpable masses. (-) night sweats.  Heme: (-) easy bruising (-) bleeding. (-) recurrent infections.   HEENT. (-) vision changes (-) hearing changes. (-) dysphagia. (-) sore throat.   Heart: (-) chest pain. (-) palpitation. (-) orthopnea. (-) LE edema.   Lungs: (-) dyspnea (on exertion) (-) hemoptysis. (-) cough.   Abdomen: (-) poor appetite. (-) abdominal pain. (-) nausea (-) vomiting. (-) diarrhea. (-) constipation.   GU: (-) dysuria (-) Urgency. (-) Hematuria.   MS. (+) joint pain right shoulder  (-) ext swelling. (-) Back pain.   Dermatologic: (-) rashes. (-) pruritus.   Psychiatric: (-) Depression. (-) anxiety. (-) insomnia.   Neurologic: (-) headaches. (-) neuropathy. (-) weakness. (-) memory problems.              Objective:     BP 116/74   Pulse 73   Temp 35.7 C (96.3 F)   Resp 16   SpO2 98%    General appearance: alert, oriented x 3, in his normal state, cooperative, not  in apparent distress, appearing stated age   Integumentary:   Overall examination of the patient's skin reveals- no rashes, no suspicious lesions, and no bruises. Normal coloration of skin. Normal skin moisture.  Head and Neck:  Normocephalic and atraumatic. No lesions or palpable masses. Neck is supple with full ROM. No bruit auscultated on the left or right. No nucchal rigidity. No lymphadenopathy. Thyroid:  Normal size and consistency, no palpable nodules, normal position and symmetric. Non tender.   Lungs: clear to auscultation bilaterally, respirations non-labored. No adventitious sounds.   Heart: regular rate and rhythm, S1, S2 normal, no murmur  Abdomen: soft, non-tender. Bowel sounds normal. No abnormal pulsations. No rigidity and no hepatosplenomegaly . Normal active bowel sounds in all quadrants.   Extremities: extremities normal, atraumatic, no cyanosis or edema, pulses intact in upper and lower extremities.  No varicose ulcers.    MS:   Right shoulder shows limited range of motion.  He can abduct his right arm about 45.  He has got lot of tenderness over the anterior insertion of the rotator cuff tendons.  He is unable to reach behind his back without significant pain.  He has no erythema or warmth of her shoulder  Psych :  Patient is alert and oriented and cooperative with the exam. No evidence of hallucinations, delusions or homicidal/suicidal ideations.   Neuro:  Cranial Nerves 2-12 are grossly intact . Coordination is normal . Gait is normal.  No meningeal signs.     Assessment :       ICD-10-CM    1. Type 2 diabetes mellitus (CMS HCC) E11.9 POCT HGB A1C     COMPREHENSIVE METABOLIC PNL, FASTING     LIPID PANEL     MICROALBUMIN/CREATININE RATIO, URINE, RANDOM   2. Screen for colon cancer Z12.11     10/2016   3. Screening PSA (prostate specific antigen) Z12.5     [facility] urologist   4. Pure hypercholesterolemia E78.00    5. Essential hypertension I10    6.  Coronary artery disease involving native coronary  artery without angina pectoris I25.10    7. Gastroesophageal reflux disease without esophagitis K21.9    8. Right shoulder pain M25.511 Refer to [facility]/Sports Medicine         PLAN :   Office Visit on 08/20/2017   Component Date Value Ref Range Status    POCT HGB A1C 08/20/2017 8* 4 - 6 % Final    6.9 on 02/19/2017          I told him he really needs to eliminate his sugars.  He has been substituting sugar for nicotine since he quit rubbing snuff.  He says that he knows what he needs to do and he is also going to try to get more active.  He hurt his shoulder and has not been able lift weights as he was doing.      Continue current treatment regimen. Patient is doing well. Patient is to call with any problems prior to next apppointment if needed.    Will draw fasting labs today.     He sees Urology for his PSA test.      He is up-to-date on screening colonoscopies.      Will refer him to Ortho for his rotator cuff tendinitis of the right shoulder.                    The patient was given ample opportunity to ask questions and those questions were answered to the patient's satisfaction. The patient was encouraged to be involved in their own care, and all diagnoses, medications, and medication side-effects were discussed.  A copy of the patient's medication list was printed and given to the patient. A good faith effort was made to reconcile the patient's medications.  The patient is aware that they are to contact me with any additional questions or concerns, or go to the ED in an emergency.         Health Maintenance Due   Topic Date Due    HIV Screening  08/24/1980    Adult Tdap-Td (1 - Tdap) 08/24/1984    Shingles Vaccine (1 of 2) 08/25/2015    Influenza Vaccine (1) 07/17/2017     Health Maintenance   Topic Date Due    HIV Screening  08/24/1980    Adult Tdap-Td (1 - Tdap) 08/24/1984    Shingles Vaccine (1 of 2) 08/25/2015    Influenza Vaccine (1) 07/17/2017    Depression Screening  02/19/2018     Colonoscopy  10/23/2026    Pneumococcal 19-64 Years Medium Risk  Completed                   This note was partially generated using MModal Fluency Direct system, and there may be some incorrect words, spellings, and punctuation that were not noted in checking the note before saving.    Dwyane Luo, DO    [facility]  [address] [place] [address]  Dept Phone: [phone]  Dept Fax: [phone]

## 2017-08-23 ENCOUNTER — Encounter (INDEPENDENT_AMBULATORY_CARE_PROVIDER_SITE_OTHER): Payer: Self-pay | Admitting: Family Medicine

## 2017-08-25 ENCOUNTER — Other Ambulatory Visit (HOSPITAL_BASED_OUTPATIENT_CLINIC_OR_DEPARTMENT_OTHER): Payer: Self-pay | Admitting: Sports Medicine

## 2017-08-25 DIAGNOSIS — M25511 Pain in right shoulder: Secondary | ICD-10-CM

## 2017-09-01 ENCOUNTER — Ambulatory Visit (HOSPITAL_BASED_OUTPATIENT_CLINIC_OR_DEPARTMENT_OTHER): Payer: Self-pay | Admitting: Sports Medicine

## 2017-09-17 ENCOUNTER — Ambulatory Visit (INDEPENDENT_AMBULATORY_CARE_PROVIDER_SITE_OTHER): Payer: BC Managed Care – PPO | Admitting: Sports Medicine

## 2017-09-17 ENCOUNTER — Encounter (HOSPITAL_BASED_OUTPATIENT_CLINIC_OR_DEPARTMENT_OTHER): Payer: Self-pay | Admitting: Sports Medicine

## 2017-09-17 ENCOUNTER — Ambulatory Visit
Admission: RE | Admit: 2017-09-17 | Discharge: 2017-09-17 | Disposition: A | Discharge status: Home / Self Care | Payer: BC Managed Care – PPO | Source: Ambulatory Visit | Attending: Sports Medicine | Admitting: Sports Medicine

## 2017-09-17 VITALS — BP 138/90 | Ht 69.0 in | Wt 221.0 lb

## 2017-09-17 DIAGNOSIS — M7541 Impingement syndrome of right shoulder: Secondary | ICD-10-CM

## 2017-09-17 DIAGNOSIS — Z6832 Body mass index (BMI) 32.0-32.9, adult: Secondary | ICD-10-CM

## 2017-09-17 DIAGNOSIS — M25511 Pain in right shoulder: Secondary | ICD-10-CM | POA: Insufficient documentation

## 2017-09-17 NOTE — Progress Notes (Signed)
[facility], MEDICAL OFFICE BUILDING  [place] [address]  Dept: [phone]  Dept Fax: [phone]        NEW PATIENT OFFICE VISIT      PATIENT NAME:  Chase [name] RECORD NUMBER: [id number]    DICTATING PHYSICIAN:  Lorenso Quarry, DO  REFERRING PHYSICIAN:  Elease Hashimoto, DO    DOB:  02-27-1966  DOS:  09/17/2017      CHIEF COMPLAINT:   Chief Complaint   Patient presents with   . Shoulder Pain     right shoulder        HISTORY OF PRESENT ILLNESS:    Chase [name] is a 51 y.o. male.  Comes to the office today for initial consultation in regards to ongoing evaluation of pain in his right shoulder he has had for about past 5 months.  It is a constant sharp pain in the lateral aspect of shoulder worse with reaching overhead.  He is right-hand dominant.  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  States golfing seems to make pain go away.  He has not tried any formal physical therapy or anti-inflammatories.                                              PAST MEDICAL HISTORY:  Past Medical History:   Diagnosis Date   . Arthritis with psoriasis (CMS HCC)    . Arthropathy     psoriatic   . CAD (coronary artery disease)    . Diabetes mellitus, type 2 ([facility] [place]) 03/19/2009   . Fracture of distal end of tibia     left   . Fracture of distal fibula     left   . GERD (gastroesophageal reflux disease)    . Hypertension    . Kidney stones    . Right shoulder pain    . Type 2 diabetes mellitus (CMS HCC)    . Urinary calculus, unspecified     Renal stones           PAST SURGICAL HISTORY:  Past Surgical History:   Procedure Laterality Date   . HX APPENDECTOMY      2017   . HX CARPAL TUNNEL RELEASE  1990    bil   . HX HEART CATHETERIZATION   11/09   . [place]    2010   . LITHOTRIPSY  2004   . PB LAP UMBILICAL HERNIA REPAIR  2009           FAMILY HISTORY:  Family Medical History:     Problem Relation (Age of Onset)    Cancer Father    Diabetes Mother, Father    Hypertension Mother              SOCIAL HISTORY:  Social History     Social History   . Marital status: Married     Spouse name: N/A   . Number of children: N/A   . Years of education: N/A     Occupational History   .  Not on file.     Social History Main Topics   . Smoking status: Current Some Day Smoker     Types: Cigars   . Smokeless tobacco: Former Systems developer     Types: Snuff     Quit date: 02/22/2017      Comment: 3-4 a week   . Alcohol use Yes      Comment: occassionally   . Drug use: No   . Sexual activity: Not on file     Other Topics Concern   . Not on file     Social History Narrative       ALLERGIES:  No Known Allergies    MEDICATIONS:  Current Outpatient Prescriptions   Medication Sig Dispense Refill   . Adalimumab (HUMIRA) 40 mg/0.8 mL Subcutaneous Syringe Kit INJECT 40 MG UNDER THE SKIN EVERY 14 DAYS 2 Kit 3   . amLODIPine (NORVASC) 10 mg Oral Tablet TAKE 1 TABLET DAILY 90 Tab 1   . enalapril (VASOTEC) 20 mg Oral Tablet Take 1 tab. Po daily 90 Tab 3   . glipiZIDE (GLUCOTROL) 5 mg Oral Tablet Take 10 mg by mouth Every morning before breakfast Take 30 minutes before meals      . metoprolol succinate (TOPROL-XL) 50 mg Tb24 take 1 Tab by mouth Once a day.  30 5   . pravastatin (PRAVACHOL) 40 mg Oral Tablet Take 1 Tab (40 mg total) by mouth Every evening (Patient taking differently: Take 20 mg by mouth Every evening ) 90 Tab 1   . Sildenafil (VIAGRA) 50 mg Oral Tablet Take 1 Tab (50 mg total) by mouth Every 24 hours as needed 12 Tab 5     No current facility-administered medications for this visit.          REVIEW OF SYMPTOMS:  Patient denies fever, chills, chest pain or shortness of breath. All other review of systems reviewed and were negative.        PHYSICAL EXAM:    Vitals:     09/17/17 0937   BP: 138/90   Weight: 100.2 kg (221 lb)   Height: 1.753 m (_0 )   PainSc:   2    Body mass index is 32.64 kg/(m^2).    General:            Healthy and cooperative,  no distress  Psychiatric:       Normal mood and affect, Alert, oriented to time, person, place   Lungs:               Respirations symmetric and non-labored  Abdomen:         Soft and non-tender  Skin:               No scars, lesions or rashes or erythema  Lymphatic:          No lymphadenopathy  Neurologic:       Dermatomal sensation intact to both upper extremities    Musculoskeletal:    Left shoulder evaluated today to establish normal values.    Right Shoulder Exam:  Patient has tenderness in the anterior shoulder, otherwise no masses, no deformity, no atrophy effusion or erythema.  Range of motion: Patient is able to forward flex the shoulder to 180 and  Abduction is to 180 , Internal rotation is to the T7 spinous process, External rotation is 60.    Stability: no laxity, anterior apprehension negative and posterior jerk test negative, sulcus sign negative,  Neer's impingement test positive  and  Luan Pulling' impingement test positive. O'Briens test negative  Muscle Strength: Supraspinatus 4/5, Infraspinatus 5/5, Subscapularis 5/5,        IMAGING:     X-rays done on September 17, 2017 reviewed today by myself in the PACS system shows no acute abnormality. Report was also reviewed.      ASSESSMENT:    1. Rotator cuff impingement syndrome of right shoulder    2. Right shoulder pain         PLAN:      I talked with the patient today about his right shoulder pain.   I do think the patient does have signs of rotator cuff impingement I did recommend did some home physical therapy and rotator cuff stretches and exercises as well as some anti-inflammatories and I will see him back in 1 month for follow-up.  I talked about formal physical therapy however he states he cannot   therapy do to the way his job hours are right now.   Patient seemed  pleased and agreeable to this plan. All questions answered to the patients satisfaction.    Lorenso Quarry, DO 09/17/2017, 09:57

## 2017-09-18 ENCOUNTER — Other Ambulatory Visit: Payer: Self-pay

## 2017-10-11 ENCOUNTER — Other Ambulatory Visit (INDEPENDENT_AMBULATORY_CARE_PROVIDER_SITE_OTHER): Payer: Self-pay | Admitting: Family Medicine

## 2017-10-11 DIAGNOSIS — E1165 Type 2 diabetes mellitus with hyperglycemia: Principal | ICD-10-CM

## 2017-10-11 DIAGNOSIS — IMO0002 Reserved for concepts with insufficient information to code with codable children: Secondary | ICD-10-CM

## 2017-10-11 MED ORDER — BLOOD-GLUCOSE METER: 1 | Each | Freq: Three times a day (TID) | 0 refills | 0 days | Status: AC

## 2017-10-11 MED ORDER — LANCETS 28 GAUGE
1.00 | Freq: Three times a day (TID) | 11 refills | Status: AC
Start: 2017-10-11 — End: ?

## 2017-10-11 MED ORDER — BLOOD SUGAR DIAGNOSTIC STRIPS
1.00 | ORAL_STRIP | Freq: Three times a day (TID) | 11 refills | Status: AC
Start: 2017-10-11 — End: ?

## 2017-10-18 ENCOUNTER — Encounter (HOSPITAL_BASED_OUTPATIENT_CLINIC_OR_DEPARTMENT_OTHER): Payer: Self-pay | Admitting: Sports Medicine

## 2017-11-26 ENCOUNTER — Encounter (INDEPENDENT_AMBULATORY_CARE_PROVIDER_SITE_OTHER): Payer: Self-pay | Admitting: Family Medicine

## 2017-12-06 ENCOUNTER — Other Ambulatory Visit (INDEPENDENT_AMBULATORY_CARE_PROVIDER_SITE_OTHER): Payer: Self-pay | Admitting: Family Medicine

## 2017-12-06 DIAGNOSIS — I10 Essential (primary) hypertension: Secondary | ICD-10-CM

## 2018-01-07 DIAGNOSIS — E119 Type 2 diabetes mellitus without complications: Secondary | ICD-10-CM | POA: Diagnosis not present

## 2018-01-07 DIAGNOSIS — I10 Essential (primary) hypertension: Secondary | ICD-10-CM | POA: Diagnosis not present

## 2018-01-07 DIAGNOSIS — M2550 Pain in unspecified joint: Secondary | ICD-10-CM | POA: Diagnosis not present

## 2018-01-07 DIAGNOSIS — R5383 Other fatigue: Secondary | ICD-10-CM | POA: Diagnosis not present

## 2018-01-07 DIAGNOSIS — E7800 Pure hypercholesterolemia, unspecified: Secondary | ICD-10-CM | POA: Diagnosis not present

## 2018-01-07 DIAGNOSIS — Z7984 Long term (current) use of oral hypoglycemic drugs: Secondary | ICD-10-CM | POA: Diagnosis not present

## 2018-01-09 ENCOUNTER — Other Ambulatory Visit (INDEPENDENT_AMBULATORY_CARE_PROVIDER_SITE_OTHER): Payer: Self-pay | Admitting: Family Medicine

## 2018-01-09 DIAGNOSIS — I10 Essential (primary) hypertension: Secondary | ICD-10-CM

## 2018-02-01 DIAGNOSIS — B349 Viral infection, unspecified: Secondary | ICD-10-CM | POA: Diagnosis not present

## 2018-02-01 DIAGNOSIS — R509 Fever, unspecified: Secondary | ICD-10-CM | POA: Diagnosis not present

## 2018-02-11 ENCOUNTER — Encounter (HOSPITAL_BASED_OUTPATIENT_CLINIC_OR_DEPARTMENT_OTHER): Payer: BC Managed Care – PPO | Admitting: Surgical

## 2018-02-17 ENCOUNTER — Encounter (HOSPITAL_BASED_OUTPATIENT_CLINIC_OR_DEPARTMENT_OTHER): Payer: BC Managed Care – PPO | Admitting: Surgical

## 2018-02-18 ENCOUNTER — Other Ambulatory Visit (INDEPENDENT_AMBULATORY_CARE_PROVIDER_SITE_OTHER): Payer: Self-pay | Admitting: Pharmacist

## 2018-02-18 ENCOUNTER — Encounter (HOSPITAL_BASED_OUTPATIENT_CLINIC_OR_DEPARTMENT_OTHER): Payer: Self-pay | Admitting: Surgical

## 2018-02-18 ENCOUNTER — Ambulatory Visit (INDEPENDENT_AMBULATORY_CARE_PROVIDER_SITE_OTHER): Payer: BC Managed Care – PPO | Admitting: Family Medicine

## 2018-02-18 ENCOUNTER — Ambulatory Visit: Payer: BC Managed Care – PPO | Attending: Surgical | Admitting: Surgical

## 2018-02-18 ENCOUNTER — Encounter (INDEPENDENT_AMBULATORY_CARE_PROVIDER_SITE_OTHER): Payer: Self-pay | Admitting: Family Medicine

## 2018-02-18 VITALS — BP 148/90 | HR 95 | Temp 97.5°F | Ht 69.0 in | Wt 211.4 lb

## 2018-02-18 VITALS — BP 124/70 | HR 77 | Temp 96.5°F | Resp 18 | Ht 69.0 in | Wt 211.0 lb

## 2018-02-18 DIAGNOSIS — Z125 Encounter for screening for malignant neoplasm of prostate: Secondary | ICD-10-CM

## 2018-02-18 DIAGNOSIS — I2510 Atherosclerotic heart disease of native coronary artery without angina pectoris: Secondary | ICD-10-CM

## 2018-02-18 DIAGNOSIS — Z1211 Encounter for screening for malignant neoplasm of colon: Secondary | ICD-10-CM

## 2018-02-18 DIAGNOSIS — L4050 Arthropathic psoriasis, unspecified: Secondary | ICD-10-CM | POA: Insufficient documentation

## 2018-02-18 DIAGNOSIS — I10 Essential (primary) hypertension: Secondary | ICD-10-CM

## 2018-02-18 DIAGNOSIS — K219 Gastro-esophageal reflux disease without esophagitis: Secondary | ICD-10-CM

## 2018-02-18 DIAGNOSIS — E1142 Type 2 diabetes mellitus with diabetic polyneuropathy: Secondary | ICD-10-CM

## 2018-02-18 DIAGNOSIS — E7800 Pure hypercholesterolemia, unspecified: Secondary | ICD-10-CM

## 2018-02-18 DIAGNOSIS — Z79899 Other long term (current) drug therapy: Secondary | ICD-10-CM | POA: Insufficient documentation

## 2018-02-18 DIAGNOSIS — Z6831 Body mass index (BMI) 31.0-31.9, adult: Secondary | ICD-10-CM

## 2018-02-18 LAB — POCT HGB A1C: POCT HGB A1C: 10.2 % — AB (ref 4–6)

## 2018-02-18 MED ORDER — ADALIMUMAB 40 MG/0.4 ML SUBCUTANEOUS PEN KIT
40.0000 mg | PEN_INJECTOR | SUBCUTANEOUS | 1 refills | Status: DC
Start: 2018-02-18 — End: 2018-03-17

## 2018-02-18 NOTE — Progress Notes (Signed)
[facility]  [address] [place] [address]  Dept Phone: [phone]  Dept Fax: [phone]    Chase [name]  03/17/1966  [id number]    Date of Service: 02/18/2018  9:00 AM EDT    Chief complaint:   Chief Complaint   Patient presents with   . Follow Up 3 Months     Pt is not fasting for labs. Pt has no complaints       Subjective:     This is a case of a 52 y.o. year old male who comes in today for a routine follow up . The patient's chronic conditions are stable and unchanged . See Problem List for chronic conditions, which have been reviewed today.       Current Outpatient Medications   Medication Sig   . Adalimumab (HUMIRA) 40 mg/0.8 mL Subcutaneous Syringe Kit INJECT 40 MG UNDER THE SKIN EVERY 14 DAYS   . amLODIPine (NORVASC) 10 mg Oral Tablet Take 1 Tab (10 mg total) by mouth Once a day   . Blood Sugar Diagnostic (ONETOUCH VERIO) Strip 1 Strip by In Vitro route Three times a day   . Blood-Glucose Meter (ONETOUCH VERIO IQ METER) Misc 1 Each by In Vitro route Three times a day   . enalapril (VASOTEC) 20 mg Oral Tablet Take 1 tab. Po daily   . glipiZIDE (GLUCOTROL XL) 10 mg Oral Tablet Extended Rel 24 hr (2) Take 1 Tab (10 mg total) by mouth Every morning with breakfast   . lancets (ONETOUCH SURESOFT LANCING DEV) 28 gauge Misc 1 Each by In Vitro route Three times a day   . metoprolol succinate (TOPROL-XL) 50 mg Oral Tablet Sustained Release 24 hr Take 1 Tab (50 mg total) by mouth Once a day   . pravastatin (PRAVACHOL) 20 mg Oral Tablet Take 1 Tab (20 mg total) by mouth Once a day   . Sildenafil (VIAGRA) 50 mg Oral Tablet Take 1 Tab (50 mg total) by mouth Every 24 hours as needed     Patient Active Problem List   Diagnosis   . Essential hypertension   . Gastroesophageal reflux disease without esophagitis   . Urinary calculus, unspecified   . Psoriatic arthritis (CMS [place])   . Fracture of distal end of tibia   . Fracture of distal fibula   . Coronary artery disease involving native  coronary artery without angina pectoris   . Diabetes mellitus, type 2 (CMS HCC)   . Back pain   . Pure hypercholesterolemia   . Worsening headaches   . Screening PSA (prostate specific antigen)   . Screen for colon cancer   . History of kidney stones   . Right shoulder pain     Past Surgical History:   Procedure Laterality Date   . HX APPENDECTOMY      2017   . HX CARPAL TUNNEL RELEASE  1990    bil   . HX HEART CATHETERIZATION  11/09   . [place]    2010   . LITHOTRIPSY  2004   . PB LAP UMBILICAL HERNIA REPAIR  2009         Family Medical History:     Problem Relation (Age of Onset)    Cancer Father    Diabetes Mother, Father    Hypertension Mother            Social History     Socioeconomic History   .  Marital status: Married     Spouse name: Not on file   . Number of children: Not on file   . Years of education: Not on file   . Highest education level: Not on file   Occupational History   . Not on file   Social Needs   . Financial resource strain: Not on file   . Food insecurity:     Worry: Not on file     Inability: Not on file   . Transportation needs:     Medical: Not on file     Non-medical: Not on file   Tobacco Use   . Smoking status: Current Some Day Smoker     Types: Cigars   . Smokeless tobacco: Former Systems developer     Types: Snuff     Quit date: 02/22/2017   . Tobacco comment: 3-4 a week   Substance and Sexual Activity   . Alcohol use: Yes     Comment: occassionally   . Drug use: No   . Sexual activity: Not on file   Lifestyle   . Physical activity:     Days per week: Not on file     Minutes per session: Not on file   . Stress: Not on file   Relationships   . Social connections:     Talks on phone: Not on file     Gets together: Not on file     Attends religious service: Not on file     Active member of club or organization: Not on file     Attends meetings of clubs or organizations: Not on file     Relationship status: Not on file   . Intimate partner violence:     Fear of current or ex partner: Not  on file     Emotionally abused: Not on file     Physically abused: Not on file     Forced sexual activity: Not on file   Other Topics Concern   . Ability to Walk 1 Flight of Steps without SOB/CP Not Asked   . Routine Exercise Not Asked   . Ability to Walk 2 Flight of Steps without SOB/CP Not Asked   . Unable to Ambulate Not Asked   . Total Care Not Asked   . Ability To Do Own ADL's Not Asked   . Uses Walker Not Asked   . Other Activity Level Not Asked   . Uses Cane Not Asked   Social History Narrative   . Not on file     Past Surgical History:   Procedure Laterality Date   . COLONOSCOPY N/A 10/23/2016    Performed by Melida Quitter, MD at [facility]   . HX APPENDECTOMY      2017   . HX CARPAL TUNNEL RELEASE  1990    bil   . HX HEART CATHETERIZATION  11/09   . [place]    2010   . LITHOTRIPSY  2004   . PB LAP UMBILICAL HERNIA REPAIR  2009         REVIEW OF SYSTEMS:  General: (-) fevers (-) chills. (-) weight loss. (-) fatigue.  Lymphatic: (-) palpable masses. (-) night sweats.  Heme: (-) easy bruising (-) bleeding. (-) recurrent infections.   HEENT. (-) vision changes (-) hearing changes. (-) dysphagia. (-) sore throat.   Heart: (-) chest pain. (-) palpitation. (-) orthopnea. (-) LE edema.   Lungs: (-) dyspnea (on exertion) (-)  hemoptysis. (-) cough.   Abdomen: (-) poor appetite. (-) abdominal pain. (-) nausea (-) vomiting. (-) diarrhea. (-) constipation.   GU: (-) dysuria (-) Urgency. (-) Hematuria.   MS. (-) joint pain (-) ext swelling. (-) Back pain.   Dermatologic: (-) rashes. (-) pruritus.   Psychiatric: (-) Depression. (-) anxiety. (-) insomnia.   Neurologic: (-) headaches. (-) neuropathy. (-) weakness. (-) memory problems.              Objective:     BP 124/70   Pulse 77   Temp 35.8 C (96.5 F) (Tympanic)   Resp 18   Ht 1.753 m (5' 9" )   Wt 95.7 kg (211 lb)   SpO2 99%   BMI 31.16 kg/m         General appearance: alert, oriented x 3, in his normal state, cooperative, not in apparent  distress, appearing stated age   Integumentary:   Overall examination of the patient's skin reveals- no rashes, no suspicious lesions, and no bruises. Normal coloration of skin. Normal skin moisture.  Head and Neck:  Normocephalic and atraumatic. No lesions or palpable masses. Neck is supple with full ROM. No bruit auscultated on the left or right. No nucchal rigidity. No lymphadenopathy. Thyroid:  Normal size and consistency, no palpable nodules, normal position and symmetric. Non tender.   Lungs: clear to auscultation bilaterally, respirations non-labored. No adventitious sounds.   Heart: regular rate and rhythm, S1, S2 normal, no murmur  Abdomen: soft, non-tender. Bowel sounds normal. No abnormal pulsations. No rigidity and no hepatosplenomegaly . Normal active bowel sounds in all quadrants.   Extremities: extremities normal, atraumatic, no cyanosis or edema, pulses intact in upper and lower extremities.  No varicose ulcers.      Psych :  Patient is alert and oriented and cooperative with the exam. No evidence of hallucinations, delusions or homicidal/suicidal ideations.   Neuro:  Cranial Nerves 2-12 are grossly intact . Coordination is normal . Gait is normal.  No meningeal signs.     Assessment :       ICD-10-CM    1. Type 2 diabetes mellitus with diabetic polyneuropathy, without long-term current use of insulin (CMS HCC) E11.42 POCT HGB A1C     COMPREHENSIVE METABOLIC PNL, FASTING     LIPID PANEL     MICROALBUMIN/CREATININE RATIO, URINE, RANDOM    last a1c was 8 on 08/20/17   2. Encounter for screening colonoscopy Z12.11     10-23-16   3. Screening PSA (prostate specific antigen) Z12.5     pt is seen at urology   4. Essential hypertension I10    5. Coronary artery disease involving native coronary artery without angina pectoris I25.10    6. Pure hypercholesterolemia E78.00    7. Gastroesophageal reflux disease without esophagitis K21.9    8. Psoriatic arthritis (CMS [place]) L40.50          PLAN :   Diabetes  Monitors  A1C: 10.2  A1C Date: 02/18/2018          Urine Microalbumin: 2.6   Microalbumin Date: 08/20/2017    Last Lipid Panel  (Last result in the past 2 years)      Cholesterol   HDL   LDL   Direct LDL   Triglycerides      08/20/17 1014 180 44   105 307        Retinal Exam Date: Not Found  Last diabetic foot exam: Not Found    Last A1c  was 8.0 .  He says that he has been eating very poorly.  He has not been exercising.  He says that he has a golf trip coming up in about a week when he gets back from that he is going to start going back to the gym 5 days a week.  He also has been eating a lot of candy and only taking his glipizide every other day.  I told him I would suggest we start him on something else get his sugars down and he refuses at this point.  He said that he has gotten down before and he knows that he can do by working on his diet and exercise.  Will see him in 3 months to recheck..    Continue current treatment regimen. Patient is doing well. Patient is to call with any problems prior to next apppointment if needed.      I gave him orders to get blood work done closer to home.    He is seeing Urology and they are checking his PSA.    He is up-to-date on screening colonoscopies.    He still seen Rheumatology.  He said it has been a rough winter with the cold weather.  He had a lot more pain.  He says that starting get better now that the weather is warming up.  He is on Humira        Tobacco cessation counseling performed.   He says he just smokes an occasional cigar on the golf course.  He has stop rubbing snuff            The patient was given ample opportunity to ask questions and those questions were answered to the patient's satisfaction. The patient was encouraged to be involved in their own care, and all diagnoses, medications, and medication side-effects were discussed.  A copy of the patient's medication list was printed and given to the patient. A good faith effort was made to reconcile the  patient's medications.  The patient is aware that they are to contact me with any additional questions or concerns, or go to the ED in an emergency.     Future Appointments   Date Time Provider [place]   02/18/2018  4:00 PM Stephens November Tenkiller, [place] [facility] [address]   04/22/2018 10:00 AM Genia Harold, PA-C [place] Maintenance Due   Topic Date Due   . HIV Screening  08/24/1980   . Adult Tdap-Td (1 - Tdap) 08/24/1984   . Shingles Vaccine (1 of 2) 08/25/2015     Health Maintenance   Topic Date Due   . HIV Screening  08/24/1980   . Adult Tdap-Td (1 - Tdap) 08/24/1984   . Shingles Vaccine (1 of 2) 08/25/2015   . Depression Screening  02/19/2018   . Influenza Vaccine (Season Ended) 07/17/2018   . Colonoscopy  10/24/2019   . Pneumococcal 19-64 Years Medium Risk  Completed                   This note was partially generated using MModal Fluency Direct system, and there may be some incorrect words, spellings, and punctuation that were not noted in checking the note before saving.    Dwyane Luo, DO    [facility]  [address] [place] [address]  Dept Phone: [phone]  Dept Fax: [phone]

## 2018-02-18 NOTE — Telephone Encounter (Signed)
Prior authorization required for prescription Humira received by [facility] on 02/18/2018.  Benefits investigation to be completed.

## 2018-02-21 NOTE — Progress Notes (Signed)
Subjective  Chase [name] is a 52 y.o. year old male who presents for No chief complaint on file.   to clinic. He was F/U with Dr Isabell Jarvis for psoriatic arthropathy and last saw him on October 07, 2015. He is on Humira every 21 days which he is stable on and tolerating it well. His arthritis and psoriasis has been stable. Recently he has been off Humira for the last 2 and half months because he ran out.  He forgot our office contact information to call for refills and follow up. He denies any morning stiffness. He reports knuckles swelling with hot weather. Due for labs with PCP. Wants to do at [facility]. Gave orders to update labs.  Follows closely with them every 3 months. Was on several diabetic medications but has decreased due to weight loss and exercise.     Current Outpatient Medications   Medication Sig   . adalimumab (HUMIRA,CF, PEN) 40 mg/0.4 mL Subcutaneous Pen Injector Kit 40 mg by Subcutaneous route Every 14 days   . amLODIPine (NORVASC) 10 mg Oral Tablet Take 1 Tab (10 mg total) by mouth Once a day   . Blood Sugar Diagnostic (ONETOUCH VERIO) Strip 1 Strip by In Vitro route Three times a day   . Blood-Glucose Meter (ONETOUCH VERIO IQ METER) Misc 1 Each by In Vitro route Three times a day   . enalapril (VASOTEC) 20 mg Oral Tablet Take 1 tab. Po daily   . glipiZIDE (GLUCOTROL XL) 10 mg Oral Tablet Extended Rel 24 hr (2) Take 1 Tab (10 mg total) by mouth Every morning with breakfast   . lancets (ONETOUCH SURESOFT LANCING DEV) 28 gauge Misc 1 Each by In Vitro route Three times a day   . metoprolol succinate (TOPROL-XL) 50 mg Oral Tablet Sustained Release 24 hr Take 1 Tab (50 mg total) by mouth Once a day   . pravastatin (PRAVACHOL) 20 mg Oral Tablet Take 1 Tab (20 mg total) by mouth Once a day   . Sildenafil (VIAGRA) 50 mg Oral Tablet Take 1 Tab (50 mg total) by mouth Every 24 hours as needed       Objective  Vitals: BP 134/80  Pulse 89  Temp 36.8 C (98.3 F) (Thermal Scan)   Ht 1.753 m (5' 9.02")  Wt  97.7 kg (215 lb 6.2 oz)  SpO2 98%  BMI 31.79 kg/m2  General: no acute distress  Eyes: Conjunctiva clear.  Cardiovascular: RRR without murmur.  Lungs: clear to auscultation bilaterally.   Joints: No synovitis  Skin: No facial rash      Assessment/Plan  1. Psoriatic Arthritis        1- Psoriatic Arthritis: stable, no active synovitis on exam  Continue on Humira every 21 days  I refilled his Humira today due for updated labs.  Will re authorize.   Continue regular monitoring labs with his PCP  RTC in a year for F/U or sooner if needed    Orders Placed This Encounter   . Hepatitis B Immune Globulin (Admin)   . ALBUMIN   . ALT (SGPT)   . AST (SGOT)   . CBC/DIFF   . CREATININE   . QUANTIFERON TB GOLD PLUS, BLOOD   . Hepatitis B Core Antibody   . Hepatitis B Surface Antigen   . adalimumab (HUMIRA,CF, PEN) 40 mg/0.4 mL Subcutaneous Pen Injector Kit     The patient was seen independently with co signing facutly present in clinic     [facility]  Weghorst, PA

## 2018-02-25 ENCOUNTER — Other Ambulatory Visit (INDEPENDENT_AMBULATORY_CARE_PROVIDER_SITE_OTHER): Payer: Self-pay | Admitting: Pharmacist

## 2018-02-25 NOTE — Telephone Encounter (Signed)
Humira PA needed. Awaiting labwork.

## 2018-03-04 ENCOUNTER — Other Ambulatory Visit (FREE_STANDING_LABORATORY_FACILITY): Payer: Self-pay | Admitting: Surgical

## 2018-03-04 ENCOUNTER — Telehealth (HOSPITAL_BASED_OUTPATIENT_CLINIC_OR_DEPARTMENT_OTHER): Payer: Self-pay | Admitting: Surgical

## 2018-03-04 ENCOUNTER — Encounter (FREE_STANDING_LABORATORY_FACILITY)
Admit: 2018-03-04 | Discharge: 2018-03-04 | Disposition: A | Discharge status: Home / Self Care | Payer: Self-pay | Attending: Surgical | Admitting: Surgical

## 2018-03-04 ENCOUNTER — Other Ambulatory Visit (FREE_STANDING_LABORATORY_FACILITY): Payer: Self-pay | Admitting: Family Medicine

## 2018-03-04 LAB — ENTER/EDIT EXTERNAL COMMON LAB RESULTS
ALBUMIN (SERUM): 4.2
ALBUMIN (SERUM): 4.2
ALT (SGPT): 28
ALT (SGPT): 29
AST (SGOT): 18
AST (SGOT): 18
AST (SGOT): 20
BUN: 16
CREATININE RANDOM URINE: 212
CREATININE: 0.9
CREATININE: 0.9
GLUCOSE,NONFAST: 175 — ABNORMAL HIGH (ref 70–110)
HCT: 49.4
HCT: 49.4
HGB: 16.7
HGB: 16.7
MICROALBUMIN RANDOM URINE: 0.8
MICROALBUMIN/CREATININE RATIO RANDOM URINE: 3.8 mg/g
PLATELET COUNT: 266
PLATELET COUNT: 266
RBC: 5.59
WBC: 5.6
WBC: 5.6
WBC: 5.6

## 2018-03-04 LAB — MICROALBUMIN/CREATININE RATIO, URINE, RANDOM
CREATININE RANDOM URINE: 212 mg/dL
MICROALBUMIN RANDOM URINE: 0.8 mg/dL (ref 0.0–2.0)
MICROALBUMIN/CREATININE RATIO RANDOM URINE: 3.8 mg/g (ref <=30.0)

## 2018-03-04 LAB — HEPATITIS B SURFACE ANTIGEN: HBV SURFACE ANTIGEN QUALITATIVE: NEGATIVE

## 2018-03-04 LAB — HEPATITIS B CORE ANTIBODY: HBV CORE TOTAL ANTIBODIES: NEGATIVE

## 2018-03-04 NOTE — Telephone Encounter (Signed)
Labs received and entered with a draw date of 03/04/18.  Duffy Rhody, MA  03/04/2018, 13:04

## 2018-03-04 NOTE — Telephone Encounter (Signed)
[facility] spoke with patient on 4/16 per patient he has a golf trip and the earliest he would be back would be 4/19 and he might try to get labwork done at this time for Humira auth. Will follow up.

## 2018-03-07 NOTE — Telephone Encounter (Signed)
Additional labs entered.  Duffy Rhody, MA  03/07/2018, 09:20

## 2018-03-09 LAB — QUANTIFERON TB GOLD PLUS, BLOOD
MITOGEN MINUS NIL RESULT: 9.41 IU/mL
NIL RESULT: 0.01 IU/mL
QUANTIFERON, QUALITATIVE: NEGATIVE
TB1 AG MINUS NIL RESULT: 0 IU/mL
TB2 AG MINUS NIL RESULT: 0 IU/mL
TB2 AG MINUS NIL RESULT: 0 IU/mL

## 2018-03-10 NOTE — Telephone Encounter (Signed)
Awaiting hepb and tb labwork to continue with Humira PA.

## 2018-03-11 ENCOUNTER — Telehealth (HOSPITAL_BASED_OUTPATIENT_CLINIC_OR_DEPARTMENT_OTHER): Payer: Self-pay | Admitting: Surgical

## 2018-03-11 ENCOUNTER — Ambulatory Visit (HOSPITAL_BASED_OUTPATIENT_CLINIC_OR_DEPARTMENT_OTHER): Payer: Self-pay | Admitting: Surgical

## 2018-03-11 NOTE — Telephone Encounter (Signed)
Routing message to [facility]. Looks like we were waiting on the TB results to finish auth. Labs are in.

## 2018-03-11 NOTE — Telephone Encounter (Signed)
Regarding: Pt out of Rx/ labs in chart  ----- Message from Benjamine Mola sent at 03/11/2018 12:11 PM EDT -----  Doctor Name:  Dessie Coma, PA                     Notes for Nurse or Physician: Pt stated he is completely out of the following Rx. Please call pt to advise.                Date of last appointment: 02-18-2018    Next scheduled visit: 02-17-2019    Medication Requested:     adalimumab (HUMIRA,CF, PEN) 40 mg/0.4 mL Subcutaneous Pen Injector Kit 6 Each 1 02/18/2018    Sig - Route: 40 mg by Subcutaneous route Every 14 days - Subcutaneous   Sent to pharmacy as: adalimumab (HUMIRA,CF, PEN) 40 mg/0.4 mL SubQ Pen Injector Kit   Class: E-Rx     Preferred [place], [place] [address]    Phone: [phone] Fax: [phone]    Not a 24 hour pharmacy; exact hours not known

## 2018-03-11 NOTE — Telephone Encounter (Signed)
Humira PA submitted awaiting response.

## 2018-03-15 NOTE — Telephone Encounter (Addendum)
Humira PA approved through [employer] until 03/15/2019. see media for approval letter. [facility] will contact patient for delivery. Clinic notified.

## 2018-03-17 ENCOUNTER — Other Ambulatory Visit (INDEPENDENT_AMBULATORY_CARE_PROVIDER_SITE_OTHER): Payer: Self-pay | Admitting: Pharmacist

## 2018-03-17 ENCOUNTER — Other Ambulatory Visit (HOSPITAL_BASED_OUTPATIENT_CLINIC_OR_DEPARTMENT_OTHER): Payer: Self-pay | Admitting: Surgical

## 2018-03-17 MED ORDER — ADALIMUMAB 40 MG/0.4 ML SUBCUTANEOUS PEN KIT
40.00 mg | PEN_INJECTOR | SUBCUTANEOUS | 1 refills | Status: DC
Start: 2018-03-17 — End: 2019-03-08

## 2018-03-17 NOTE — Telephone Encounter (Signed)
[facility] Inflammatory Pharmacist Assessment    Treatment Regimen:  Humira CF 40mg  PEN - 1 pen SubQ Q14D  Diagnosis:  Psoriatic arthritis  Treatment Start Date:  Per Epic chart review, on Humira since around 2015.    Spoke with patient re: Humira (03/17/18 @ 12:48PM).  Noted patient has been off of Humira for around 3 months as he ran out of medication; per notes, patient stated he forgot to call clinic for refills and follow-up.  Noted patient was previously on syringes; however, prescription at [facility] is for pens.  Patient stated he was ok with trying pens and mentioned he previously utilized an insulin pen.  Discussed similarities and differences between Humira pen and an insulin pen.  Provided education re: medication storage and appropriate injection administration with Humira pen.  Patient reported no previous issues with Humira syringe injections or previous side effects.  Patient stated he utilized memory to keep track of injections; thus, discussed potential methods to assist with adherence (i.e., calendar, Humira Complete mobile app).  Patient experiencing joint pain and swelling in fingers, lower back, knee as well as occasional morning stiffness.  Patient rated current symptoms as a 3 on a scale of 1-10, with 1 being mild and 10 being severe.    Obtained updated medication list.  Patient currently taking:  amlodipine, enalapril, ibuprofen PRN, glipizide, metoprolol succinate, pravastatin, and Viagra.  No significant drug-drug interactions with Humira expected.  Offered to provide supplies (i.e., sharps container, alcohol swabs, band-aids) with refill; patient declined as he still has supplies at home.    Patient asked about 3 month supply of Humira.  Attempted claim of 6 pens for an 84 day supply of Humira through patient's insurance ([employer]); however, rejected stating "max days supply allowed is 31".  Advised patient of such.  Patient stated when he previously filled at [facility], they sent  him a 3 month supply.  Again advised patient that his insurance will not pay for 3 month supply.  Patient stated he was ok with 1 fill from [facility] and would see how that goes; thus, transferred patient to scheduling staff.  Subsequently received notification from scheduling staff Nira Conn) that patient stated he does not want to fill at [facility]; patient stated he would prefer to continue to fill at [facility].  Thus, will send message to clinic to request new Humira prescription be sent to [facility] for a 3 month supply.  Will route note to clinic to make aware.  Humira prescription placed on hold and will opt patient out of [facility] education program as he is not going to fill Humira at [facility].    Tana Conch, PharmD, BCPS 03/17/2018, 14:09

## 2018-03-17 NOTE — Telephone Encounter (Signed)
Patient is requesting medication to be filled at [facility]. Pended medication.

## 2018-03-17 NOTE — Telephone Encounter (Signed)
Attempted to contact patient re: Humira (03/17/18 @ 9:56AM). Left voicemail for patient to return my call.    Tana Conch, PharmD, BCPS 03/17/2018, 09:59

## 2018-03-18 ENCOUNTER — Encounter (HOSPITAL_COMMUNITY): Payer: Self-pay

## 2018-03-18 ENCOUNTER — Ambulatory Visit (INDEPENDENT_AMBULATORY_CARE_PROVIDER_SITE_OTHER): Payer: Worker's Compensation

## 2018-03-18 ENCOUNTER — Ambulatory Visit (HOSPITAL_COMMUNITY)
Admission: EM | Admit: 2018-03-18 | Discharge: 2018-03-18 | Disposition: A | Discharge status: Home / Self Care | Payer: Worker's Compensation | Attending: Urgent Care | Admitting: Urgent Care

## 2018-03-18 DIAGNOSIS — R0789 Other chest pain: Secondary | ICD-10-CM

## 2018-03-18 DIAGNOSIS — Z026 Encounter for examination for insurance purposes: Secondary | ICD-10-CM

## 2018-03-18 DIAGNOSIS — M940 Chondrocostal junction syndrome [Tietze]: Secondary | ICD-10-CM

## 2018-03-18 LAB — GLUCOSE, CAPILLARY: GLUCOSE-CAPILLARY: 99 mg/dL (ref 65–99)

## 2018-03-18 MED ORDER — CYCLOBENZAPRINE HCL 5 MG PO TABS: 5.0000 mg | ORAL_TABLET | Freq: Every day | ORAL | 0 refills | Status: DC

## 2018-03-18 MED ORDER — PREDNISONE 20 MG PO TABS: ORAL_TABLET | ORAL | 0 refills | Status: DC

## 2018-03-18 NOTE — ED Provider Notes (Signed)
  MRN: [id number] DOB: [birth year]  Subjective:   Kyle Hughes is a 52 y.o. male presenting for 10 day history of mid-low chest pain that can radiate into his back occasionally. Pain started from lifting a heavy metal object that was pressing against his mid-low chest.  Pain is worse with movement. Has done light duty,  ibuprofen with minimal improvement, gives him only temporary movement.  Denies fever, shob, ecchymosis, n/v, abdominal pain.   Kyle Hughes has a medication list, allergies, pmh and psh that were reviewed, updated as appropriate and not included due to being a worker's injury case.    Objective:   Vitals: BP (!) 150/95   Pulse 73   Temp 98.5 F (36.9 C)   Resp 18   SpO2 95%   Physical Exam  Constitutional: He is oriented to person, place, and time. He appears well-developed and well-nourished.  HENT:  Mouth/Throat: Oropharynx is clear and moist.  Eyes: No scleral icterus.  Cardiovascular: Normal rate, regular rhythm and intact distal pulses. Exam reveals no gallop and no friction rub.  No murmur heard. Pulmonary/Chest: No respiratory distress. He has no wheezes. He has no rales. He exhibits no tenderness.  Neurological: He is alert and oriented to person, place, and time.  Skin: Skin is warm and dry.  Psychiatric: He has a normal mood and affect.   Dg Chest 2 View  Result Date: 03/18/2018 CLINICAL DATA:  Atypical low sternal chest pain. Patient felt a pop after a lifting injury at work 10 days ago. EXAM: CHEST - 2 VIEW COMPARISON:  None. FINDINGS: Heart size is normal. There is no edema or effusion. No focal airspace disease is present. IMPRESSION: Negative two view chest x-ray Electronically Signed   By: Marin Roberts M.D.   On: 03/18/2018 19:35   Results for orders placed or performed during the hospital encounter of 03/18/18 (from the past 24 hour(s))  Glucose, capillary     Status: None   Collection Time: 03/18/18  7:23 PM  Result Value Ref Range   Glucose-Capillary 99 65 - 99 mg/dL     Assessment and Plan :   Costochondritis  Encounter related to worker's compensation claim  Will have patient start prednisone course to address his costochondritis given that he has failed treatment with 800 mg of ibuprofen 3 times daily.  Will also add Flexeril.  Recommended patient check back with his Worker's Science writer regarding his work restrictions. Counseled patient on potential for adverse effects with medications prescribed today, patient verbalized understanding.  Follow-up as needed.     Wallis Bamberg, [place] 03/18/18 1947

## 2018-03-18 NOTE — ED Notes (Signed)
Pt discharged by provider.

## 2018-03-18 NOTE — ED Triage Notes (Signed)
Pt presents to get his chest wall pain from an injury at work checked out again because he is still feeling sore.

## 2018-03-21 NOTE — Telephone Encounter (Signed)
Tried to call patient. No answer, unable to LM. His medication has been received by [facility] and they should call him today regarding delivery.

## 2018-03-21 NOTE — Telephone Encounter (Signed)
Regarding: missed call per pt   ----- Message from Myrtie Neither sent at 03/21/2018  9:59 AM EDT -----  H. Weghorst:    The pt states he's returning a call to our office regarding a Rx and he wants the nurse to call him back at home number listed.

## 2018-03-21 NOTE — Telephone Encounter (Signed)
Patient called in regarding his script. States that [facility] never received it. Called [facility] and they had received it 03/17/18. They are processing today and will call the patient regarding delivery later today. Left detailed message for patient.

## 2018-04-19 ENCOUNTER — Telehealth (HOSPITAL_BASED_OUTPATIENT_CLINIC_OR_DEPARTMENT_OTHER): Payer: Self-pay | Admitting: Medical

## 2018-04-19 NOTE — Telephone Encounter (Signed)
Per Lenna Sciara - patient needs to be rescheduled. (sal)  I called and left a detailed message on patient's phone.  Will you please reschedule and mail appt card.

## 2018-04-20 NOTE — Telephone Encounter (Signed)
Rescheduled and mailed out apt

## 2018-04-22 ENCOUNTER — Encounter (HOSPITAL_BASED_OUTPATIENT_CLINIC_OR_DEPARTMENT_OTHER): Payer: Self-pay | Admitting: Medical

## 2018-05-16 ENCOUNTER — Encounter (HOSPITAL_BASED_OUTPATIENT_CLINIC_OR_DEPARTMENT_OTHER): Payer: Self-pay | Admitting: Medical

## 2018-05-16 ENCOUNTER — Encounter (INDEPENDENT_AMBULATORY_CARE_PROVIDER_SITE_OTHER): Payer: Self-pay | Admitting: Family Medicine

## 2018-05-20 ENCOUNTER — Encounter (INDEPENDENT_AMBULATORY_CARE_PROVIDER_SITE_OTHER): Payer: Self-pay | Admitting: Family Medicine

## 2018-05-27 ENCOUNTER — Encounter (INDEPENDENT_AMBULATORY_CARE_PROVIDER_SITE_OTHER): Payer: Self-pay | Admitting: Family Medicine

## 2018-06-10 ENCOUNTER — Other Ambulatory Visit (INDEPENDENT_AMBULATORY_CARE_PROVIDER_SITE_OTHER): Payer: Self-pay | Admitting: Family Medicine

## 2018-06-10 DIAGNOSIS — N528 Other male erectile dysfunction: Secondary | ICD-10-CM

## 2018-07-01 ENCOUNTER — Encounter (INDEPENDENT_AMBULATORY_CARE_PROVIDER_SITE_OTHER): Payer: Self-pay | Admitting: Family Medicine

## 2018-07-22 ENCOUNTER — Encounter (INDEPENDENT_AMBULATORY_CARE_PROVIDER_SITE_OTHER): Payer: Self-pay | Admitting: Family Medicine

## 2018-07-27 DIAGNOSIS — N183 Chronic kidney disease, stage 3 (moderate): Secondary | ICD-10-CM | POA: Diagnosis not present

## 2018-07-27 DIAGNOSIS — E559 Vitamin D deficiency, unspecified: Secondary | ICD-10-CM | POA: Diagnosis not present

## 2018-07-27 DIAGNOSIS — E7800 Pure hypercholesterolemia, unspecified: Secondary | ICD-10-CM | POA: Diagnosis not present

## 2018-07-27 DIAGNOSIS — I10 Essential (primary) hypertension: Secondary | ICD-10-CM | POA: Diagnosis not present

## 2018-07-27 DIAGNOSIS — E1122 Type 2 diabetes mellitus with diabetic chronic kidney disease: Secondary | ICD-10-CM | POA: Diagnosis not present

## 2018-08-04 DIAGNOSIS — M19012 Primary osteoarthritis, left shoulder: Secondary | ICD-10-CM | POA: Diagnosis not present

## 2018-08-04 DIAGNOSIS — M25512 Pain in left shoulder: Secondary | ICD-10-CM | POA: Diagnosis not present

## 2018-08-04 DIAGNOSIS — M2560 Stiffness of unspecified joint, not elsewhere classified: Secondary | ICD-10-CM | POA: Diagnosis not present

## 2018-08-04 DIAGNOSIS — M1712 Unilateral primary osteoarthritis, left knee: Secondary | ICD-10-CM | POA: Diagnosis not present

## 2018-08-04 DIAGNOSIS — M19011 Primary osteoarthritis, right shoulder: Secondary | ICD-10-CM | POA: Diagnosis not present

## 2018-08-04 DIAGNOSIS — M25551 Pain in right hip: Secondary | ICD-10-CM | POA: Diagnosis not present

## 2018-08-04 DIAGNOSIS — M1711 Unilateral primary osteoarthritis, right knee: Secondary | ICD-10-CM | POA: Diagnosis not present

## 2018-08-04 DIAGNOSIS — M533 Sacrococcygeal disorders, not elsewhere classified: Secondary | ICD-10-CM | POA: Diagnosis not present

## 2018-08-04 DIAGNOSIS — M25511 Pain in right shoulder: Secondary | ICD-10-CM | POA: Diagnosis not present

## 2018-08-04 DIAGNOSIS — M48061 Spinal stenosis, lumbar region without neurogenic claudication: Secondary | ICD-10-CM | POA: Diagnosis not present

## 2018-08-04 DIAGNOSIS — M545 Low back pain: Secondary | ICD-10-CM | POA: Diagnosis not present

## 2018-08-04 DIAGNOSIS — M25569 Pain in unspecified knee: Secondary | ICD-10-CM | POA: Diagnosis not present

## 2018-08-04 DIAGNOSIS — M2550 Pain in unspecified joint: Secondary | ICD-10-CM | POA: Diagnosis not present

## 2018-08-04 DIAGNOSIS — M5136 Other intervertebral disc degeneration, lumbar region: Secondary | ICD-10-CM | POA: Diagnosis not present

## 2018-08-04 DIAGNOSIS — M549 Dorsalgia, unspecified: Secondary | ICD-10-CM | POA: Diagnosis not present

## 2018-08-04 DIAGNOSIS — M25559 Pain in unspecified hip: Secondary | ICD-10-CM | POA: Diagnosis not present

## 2018-08-17 DIAGNOSIS — M2550 Pain in unspecified joint: Secondary | ICD-10-CM | POA: Diagnosis not present

## 2018-08-17 DIAGNOSIS — M461 Sacroiliitis, not elsewhere classified: Secondary | ICD-10-CM | POA: Diagnosis not present

## 2018-08-17 DIAGNOSIS — M459 Ankylosing spondylitis of unspecified sites in spine: Secondary | ICD-10-CM | POA: Diagnosis not present

## 2018-08-17 DIAGNOSIS — E1169 Type 2 diabetes mellitus with other specified complication: Secondary | ICD-10-CM | POA: Diagnosis not present

## 2018-08-17 DIAGNOSIS — E785 Hyperlipidemia, unspecified: Secondary | ICD-10-CM | POA: Diagnosis not present

## 2018-09-09 ENCOUNTER — Encounter (INDEPENDENT_AMBULATORY_CARE_PROVIDER_SITE_OTHER): Payer: Self-pay | Admitting: Family Medicine

## 2018-10-30 ENCOUNTER — Other Ambulatory Visit (INDEPENDENT_AMBULATORY_CARE_PROVIDER_SITE_OTHER): Payer: Self-pay | Admitting: Family Medicine

## 2018-10-30 DIAGNOSIS — I10 Essential (primary) hypertension: Secondary | ICD-10-CM

## 2018-11-03 DIAGNOSIS — M25561 Pain in right knee: Secondary | ICD-10-CM | POA: Diagnosis not present

## 2018-11-10 DIAGNOSIS — M25561 Pain in right knee: Secondary | ICD-10-CM | POA: Diagnosis not present

## 2018-11-24 DIAGNOSIS — M25561 Pain in right knee: Secondary | ICD-10-CM | POA: Diagnosis not present

## 2018-11-25 ENCOUNTER — Encounter (INDEPENDENT_AMBULATORY_CARE_PROVIDER_SITE_OTHER): Payer: Self-pay | Admitting: Family Medicine

## 2018-11-25 ENCOUNTER — Ambulatory Visit (INDEPENDENT_AMBULATORY_CARE_PROVIDER_SITE_OTHER): Payer: BC Managed Care – PPO | Admitting: Family Medicine

## 2018-11-25 ENCOUNTER — Other Ambulatory Visit: Payer: BC Managed Care – PPO | Attending: Family Medicine | Admitting: Family Medicine

## 2018-11-25 VITALS — BP 128/78 | HR 67 | Temp 96.4°F | Resp 16 | Ht 69.0 in | Wt 212.6 lb

## 2018-11-25 DIAGNOSIS — Z125 Encounter for screening for malignant neoplasm of prostate: Secondary | ICD-10-CM

## 2018-11-25 DIAGNOSIS — E1100 Type 2 diabetes mellitus with hyperosmolarity without nonketotic hyperglycemic-hyperosmolar coma (NKHHC): Secondary | ICD-10-CM | POA: Insufficient documentation

## 2018-11-25 DIAGNOSIS — Z6831 Body mass index (BMI) 31.0-31.9, adult: Secondary | ICD-10-CM

## 2018-11-25 DIAGNOSIS — I10 Essential (primary) hypertension: Secondary | ICD-10-CM

## 2018-11-25 DIAGNOSIS — I2510 Atherosclerotic heart disease of native coronary artery without angina pectoris: Secondary | ICD-10-CM

## 2018-11-25 DIAGNOSIS — E7800 Pure hypercholesterolemia, unspecified: Secondary | ICD-10-CM

## 2018-11-25 DIAGNOSIS — K219 Gastro-esophageal reflux disease without esophagitis: Secondary | ICD-10-CM

## 2018-11-25 DIAGNOSIS — Z1211 Encounter for screening for malignant neoplasm of colon: Secondary | ICD-10-CM

## 2018-11-25 LAB — LIPID PANEL
CHOLESTEROL: 163 mg/dL (ref 0–199)
HDL CHOL: 42 mg/dL (ref >40)
LDL DIRECT: 107 mg/dL — ABNORMAL HIGH (ref 0–99)
TRIGLYCERIDES: 214 mg/dL — ABNORMAL HIGH (ref 0–199)
TRIGLYCERIDES: 214 mg/dL — ABNORMAL HIGH (ref 0–199)
VLDL CALC: 43 mg/dL (ref 0–50)

## 2018-11-25 LAB — COMPREHENSIVE METABOLIC PNL, FASTING
ALBUMIN: 4.7 g/dL — ABNORMAL HIGH (ref 3.2–4.6)
ALKALINE PHOSPHATASE: 63 U/L (ref 20–130)
ALT (SGPT): 35 U/L (ref <=52)
ANION GAP: 6 mmol/L
AST (SGOT): 16 U/L (ref <=35)
BILIRUBIN TOTAL: 0.9 mg/dL (ref 0.3–1.2)
BUN/CREA RATIO: 16
BUN: 17 mg/dL (ref 10–25)
CALCIUM: 9.9 mg/dL (ref 8.8–10.3)
CHLORIDE: 103 mmol/L (ref 98–111)
CO2 TOTAL: 30 mmol/L (ref 21–35)
CREATININE: 1.04 mg/dL (ref <=1.30)
ESTIMATED GFR: >60 mL/min/1.73mˆ2
GLUCOSE: 164 mg/dL — ABNORMAL HIGH (ref 70–110)
POTASSIUM: 4.6 mmol/L (ref 3.5–5.0)
PROTEIN TOTAL: 7 g/dL (ref 6.0–8.3)
SODIUM: 139 mmol/L (ref 135–145)

## 2018-11-25 LAB — POCT HGB A1C: POCT HGB A1C: 8.2 % — AB (ref 4–6)

## 2018-11-25 LAB — MICROALBUMIN/CREATININE RATIO, URINE, RANDOM
CREATININE RANDOM URINE: 173 mg/dL
MICROALBUMIN RANDOM URINE: 1.9 mg/dL (ref 0.0–1.9)
MICROALBUMIN/CREATININE RATIO RANDOM URINE: 11 mg/g (ref 0.0–30.0)

## 2018-11-25 LAB — PSA SCREENING: PSA: 0.3 ng/mL (ref 0.0–4.0)

## 2018-11-25 NOTE — Progress Notes (Signed)
[place]  [address] [place] [address]  Dept Phone: [phone]  Dept Fax: [place]  11-Aug-1966  [id number]    Date of Service: 11/25/2018 11:00 AM EST    Chief complaint:   Chief Complaint   Patient presents with   . Follow Up 6 Months     Patient is fasting. No complaints       Subjective:     This is a case of a 53 y.o. year old male who comes in today for a routine follow up .         The patient's chronic conditions are stable and unchanged . See Problem List for chronic conditions, which have been reviewed today.       Current Outpatient Medications   Medication Sig   . adalimumab (HUMIRA,CF, PEN) 40 mg/0.4 mL Subcutaneous Pen Injector Kit 40 mg by Subcutaneous route Every 14 days   . amLODIPine (NORVASC) 10 mg Oral Tablet Take 1 Tab (10 mg total) by mouth Once a day   . Blood Sugar Diagnostic (ONETOUCH VERIO) Strip 1 Strip by In Vitro route Three times a day   . Blood-Glucose Meter (ONETOUCH VERIO IQ METER) Misc 1 Each by In Vitro route Three times a day   . enalapril (VASOTEC) 20 mg Oral Tablet Take 1 Tab (20 mg total) by mouth Once a day TAKE 1 TABLET DAILY   . glipiZIDE (GLUCOTROL XL) 10 mg Oral Tablet Extended Rel 24 hr (2) Take 1 Tab (10 mg total) by mouth Every morning with breakfast   . lancets (ONETOUCH SURESOFT LANCING DEV) 28 gauge Misc 1 Each by In Vitro route Three times a day   . metoprolol succinate (TOPROL-XL) 50 mg Oral Tablet Sustained Release 24 hr Take 1 Tab (50 mg total) by mouth Once a day   . pravastatin (PRAVACHOL) 20 mg Oral Tablet Take 1 Tab (20 mg total) by mouth Once a day   . Sildenafil (VIAGRA) 50 mg Oral Tablet TAKE 1 TABLET BY MOUTH EVERY 24 HOURS AS NEEDED     Patient Active Problem List   Diagnosis   . Essential hypertension   . Gastroesophageal reflux disease without esophagitis   . Urinary calculus, unspecified   . Psoriatic arthritis (CMS [place])   . Fracture of distal end of tibia   . Fracture of distal fibula   .  Coronary artery disease involving native coronary artery without angina pectoris   . Diabetes mellitus, type 2 (CMS HCC)   . Back pain   . Pure hypercholesterolemia   . Worsening headaches   . Screening PSA (prostate specific antigen)   . Screen for colon cancer   . History of kidney stones   . Right shoulder pain     Past Surgical History:   Procedure Laterality Date   . HX APPENDECTOMY      2017   . HX CARPAL TUNNEL RELEASE  1990    bil   . HX HEART CATHETERIZATION  11/09   . [place]    2010   . LITHOTRIPSY  2004   . PB LAP UMBILICAL HERNIA REPAIR  2009         Family Medical History:     Problem Relation (Age of Onset)    Cancer Father    Diabetes Mother, Father    Hypertension (High Blood Pressure) Mother  Social History     Socioeconomic History   . Marital status: Married     Spouse name: Not on file   . Number of children: Not on file   . Years of education: Not on file   . Highest education level: Not on file   Tobacco Use   . Smoking status: Current Some Day Smoker     Types: Cigars   . Smokeless tobacco: Former Systems developer     Types: Snuff     Quit date: 02/22/2017   . Tobacco comment: 3-4 a week   Substance and Sexual Activity   . Alcohol use: Yes     Comment: occassionally   . Drug use: No   Other Topics Concern     Past Surgical History:   Procedure Laterality Date   . COLONOSCOPY N/A 10/23/2016    Performed by Melida Quitter, MD at [facility]   . HX APPENDECTOMY      2017   . HX CARPAL TUNNEL RELEASE  1990    bil   . HX HEART CATHETERIZATION  11/09   . [place]    2010   . LITHOTRIPSY  2004   . PB LAP UMBILICAL HERNIA REPAIR  2009         REVIEW OF SYSTEMS:  General: (-) fevers (-) chills. (-) weight loss. (-) fatigue.  Lymphatic: (-) palpable masses. (-) night sweats.  Heme: (-) easy bruising (-) bleeding. (-) recurrent infections.   HEENT. (-) vision changes (-) hearing changes. (-) dysphagia. (-) sore throat.   Heart: (-) chest pain. (-) palpitation. (-) orthopnea. (-) LE  edema.   Lungs: (-) dyspnea (on exertion) (-) hemoptysis. (-) cough.   Abdomen: (-) poor appetite. (-) abdominal pain. (-) nausea (-) vomiting. (-) diarrhea. (-) constipation.   GU: (-) dysuria (-) Urgency. (-) Hematuria.   MS. (-) joint pain (-) ext swelling. (-) Back pain.   Dermatologic: (-) rashes. (-) pruritus.   Psychiatric: (-) Depression. (-) anxiety. (-) insomnia.   Neurologic: (-) headaches. (-) neuropathy. (-) weakness. (-) memory problems.              Objective:     BP 128/78   Pulse 67   Temp 35.8 C (96.4 F)   Resp 16   Ht 1.753 m (5' 9" )   Wt 96.4 kg (212 lb 9.6 oz)   SpO2 99%   BMI 31.40 kg/m         General appearance: alert, oriented x 3, in his normal state, cooperative, not in apparent distress, appearing stated age   51:   Overall examination of the patient's skin reveals- no rashes, no suspicious lesions, and no bruises. Normal coloration of skin. Normal skin moisture.  Head and Neck:  Normocephalic and atraumatic. No lesions or palpable masses. Neck is supple with full ROM. No bruit auscultated on the left or right. No nucchal rigidity. No lymphadenopathy. Thyroid:  Normal size and consistency, no palpable nodules, normal position and symmetric. Non tender.   Lungs: clear to auscultation bilaterally, respirations non-labored. No adventitious sounds.   Heart: regular rate and rhythm, S1, S2 normal, no murmur  Abdomen: soft, non-tender. Bowel sounds normal. No abnormal pulsations. No rigidity and no hepatosplenomegaly . Normal active bowel sounds in all quadrants.   Extremities: extremities normal, atraumatic, no cyanosis or edema, pulses intact in upper and lower extremities.  No varicose ulcers.      Psych :  Patient is alert  and oriented and cooperative with the exam. No evidence of hallucinations, delusions or homicidal/suicidal ideations.   Neuro:  Cranial Nerves 2-12 are grossly intact . Coordination is normal . Gait is normal.  No meningeal signs.     Assessment :            ICD-10-CM    1. Type 2 diabetes mellitus with hyperosmolarity without coma, without long-term current use of insulin (CMS HCC) E11.00 POCT HGB A1C     COMPREHENSIVE METABOLIC PNL, FASTING     LIPID PANEL     MICROALBUMIN/CREATININE RATIO, URINE, RANDOM    a1c was 10.5% on 02/18/18   2. Essential hypertension I10    3. Coronary artery disease involving native coronary artery without angina pectoris I25.10    4. Pure hypercholesterolemia E78.00    5. Gastroesophageal reflux disease without esophagitis K21.9    6. Screen for colon cancer Z12.11     10-23-16   7. Screening PSA (prostate specific antigen) Z12.5 PSA SCREENING         PLAN :   Diabetes Monitors  A1C: 8.2  A1C Date: 11/25/2018           Nephropathy Screening: On ACEI or ARB    Last Lipid Panel  (Last result in the past 2 years)      Cholesterol   HDL   LDL   Direct LDL   Triglycerides      08/20/17 1014 180 44   105 307        Retinal Exam Date: Not Found  Last diabetic foot exam: Not Found      His A1c is down from 10.5 to 8.2.  He admits he ate poorly over the holidays.  He is going to work on diet he is still exercising some.  Will continue current medications.    Continue current treatment regimen. Patient is doing well. Patient is to call with any problems prior to next apppointment if needed.    Will draw fasting labs today.     Seen Urology in the past.  He has not had a PSA for over a year.  Will check that on his labs today.    He is up-to-date on screening colonoscopies.    He has on his problem list coronary artery disease but he says that he does not have any knowledge of that and has never had a heart attack.  He is not currently seen a cardiologist and is asymptomatic.              PHQ Questionnaire  Little interest or pleasure in doing things.: Not at all  Feeling down, depressed, or hopeless: Not at all  PHQ 2 Total: 0      The patient was given ample opportunity to ask questions and those questions were answered to the patient's  satisfaction. The patient was encouraged to be involved in their own care, and all diagnoses, medications, and medication side-effects were discussed.  A copy of the patient's medication list was printed and given to the patient. A good faith effort was made to reconcile the patient's medications.  The patient is aware that they are to contact me with any additional questions or concerns, or go to the ED in an emergency.     Future Appointments   Date Time Provider [place]   02/17/2019 11:00 AM Weg[place]st, [place], [place] [facility] [address]       Health Maintenance Due   Topic Date Due   .  HIV Screening  08/24/1980   . Adult Tdap-Td (1 - Tdap) 08/24/1984   . Shingles Vaccine (1 of 2) 08/25/2015   . Depression Screening  02/19/2018   . Influenza Vaccine (1) 07/17/2018     Health Maintenance   Topic Date Due   . HIV Screening  08/24/1980   . Adult Tdap-Td (1 - Tdap) 08/24/1984   . Shingles Vaccine (1 of 2) 08/25/2015   . Depression Screening  02/19/2018   . Influenza Vaccine (1) 07/17/2018   . Colonoscopy  10/24/2019   . Pneumococcal 19-64 Years Medium Risk  Completed                   This note was partially generated using MModal Fluency Direct system, and there may be some incorrect words, spellings, and punctuation that were not noted in checking the note before saving.    Dwyane Luo, DO    [facility]  [address] [place] [address]  Dept Phone: [phone]  Dept Fax: [phone]

## 2018-11-28 ENCOUNTER — Other Ambulatory Visit (INDEPENDENT_AMBULATORY_CARE_PROVIDER_SITE_OTHER): Payer: Self-pay | Admitting: Family Medicine

## 2018-11-28 DIAGNOSIS — M1711 Unilateral primary osteoarthritis, right knee: Secondary | ICD-10-CM | POA: Diagnosis not present

## 2018-11-28 DIAGNOSIS — M25661 Stiffness of right knee, not elsewhere classified: Secondary | ICD-10-CM | POA: Diagnosis not present

## 2018-11-28 DIAGNOSIS — E7800 Pure hypercholesterolemia, unspecified: Secondary | ICD-10-CM

## 2018-11-28 MED ORDER — ROSUVASTATIN 20 MG TABLET: 20 mg | Tab | Freq: Every evening | ORAL | 4 refills | 0 days | Status: AC

## 2018-12-29 DIAGNOSIS — M1711 Unilateral primary osteoarthritis, right knee: Secondary | ICD-10-CM | POA: Diagnosis not present

## 2019-01-09 DIAGNOSIS — M1711 Unilateral primary osteoarthritis, right knee: Secondary | ICD-10-CM | POA: Diagnosis not present

## 2019-01-12 DIAGNOSIS — M1711 Unilateral primary osteoarthritis, right knee: Secondary | ICD-10-CM | POA: Diagnosis not present

## 2019-01-19 DIAGNOSIS — M1711 Unilateral primary osteoarthritis, right knee: Secondary | ICD-10-CM | POA: Diagnosis not present

## 2019-01-26 DIAGNOSIS — M1711 Unilateral primary osteoarthritis, right knee: Secondary | ICD-10-CM | POA: Diagnosis not present

## 2019-02-09 ENCOUNTER — Encounter (HOSPITAL_BASED_OUTPATIENT_CLINIC_OR_DEPARTMENT_OTHER): Payer: Self-pay | Admitting: Surgical

## 2019-02-10 NOTE — Telephone Encounter (Signed)
Called patient  Left message about alternatives options to reschedule appointment in light of COVID  Asked to return call

## 2019-02-14 ENCOUNTER — Ambulatory Visit (HOSPITAL_BASED_OUTPATIENT_CLINIC_OR_DEPARTMENT_OTHER): Payer: Self-pay | Admitting: Surgical

## 2019-02-14 NOTE — Telephone Encounter (Signed)
Returned call to pt. Advised pt that he should continue taking his Humira but should follow the safety precautions on the CDC web site.  Duffy Rhody, MA  02/14/2019, 12:17

## 2019-02-14 NOTE — Telephone Encounter (Signed)
Regarding: questions  ----- Message from Myrtie Neither sent at 02/14/2019 11:43 AM EDT -----  H. Weghorst:    The pt is asking to speak with someone regarding questions he has on Humira and he is wondering if he should stop this while the virus is going around or if he should continue using the Humira?

## 2019-02-17 ENCOUNTER — Encounter (HOSPITAL_BASED_OUTPATIENT_CLINIC_OR_DEPARTMENT_OTHER): Payer: Self-pay | Admitting: Surgical

## 2019-02-20 DIAGNOSIS — M1711 Unilateral primary osteoarthritis, right knee: Secondary | ICD-10-CM | POA: Diagnosis not present

## 2019-02-23 DIAGNOSIS — M25561 Pain in right knee: Secondary | ICD-10-CM | POA: Diagnosis not present

## 2019-02-24 ENCOUNTER — Encounter (INDEPENDENT_AMBULATORY_CARE_PROVIDER_SITE_OTHER): Payer: Self-pay | Admitting: Family Medicine

## 2019-02-28 DIAGNOSIS — M1711 Unilateral primary osteoarthritis, right knee: Secondary | ICD-10-CM | POA: Diagnosis not present

## 2019-03-05 ENCOUNTER — Other Ambulatory Visit (INDEPENDENT_AMBULATORY_CARE_PROVIDER_SITE_OTHER): Payer: Self-pay | Admitting: Family Medicine

## 2019-03-05 DIAGNOSIS — I10 Essential (primary) hypertension: Secondary | ICD-10-CM

## 2019-03-08 ENCOUNTER — Other Ambulatory Visit (HOSPITAL_BASED_OUTPATIENT_CLINIC_OR_DEPARTMENT_OTHER): Payer: Self-pay | Admitting: Surgical

## 2019-03-08 NOTE — Telephone Encounter (Signed)
PA needed. Pended to [employer].   Duffy Rhody, MA  03/08/2019, 11:47

## 2019-03-08 NOTE — Telephone Encounter (Signed)
-----  Message from Soyla Dryer, Pharmacy Technician sent at 03/08/2019 11:43 AM EDT -----  Regarding: RE: Prior auth updates?  ----- Message from Benjamine Mola sent at 03/08/2019 10:43 AM EDT -----  Dessie Coma, PA    Wells Guiles called in stating that she had submitted a prior auth request for the following Rx to Cover My Meds on 03-01-2019 and wanted to see if we had any updates for this. Please call to advise. Thank you!    adalimumab (HUMIRA,CF, PEN) 40 mg/0.4 mL Subcutaneous Pen Injector Kit 6 Each 1 03/17/2018    Sig - Route: 40 mg by Subcutaneous route Every 14 days - Subcutaneous   Sent to pharmacy as: adalimumab (HUMIRA,CF, PEN) 40 mg/0.4 mL SubQ Pen Injector Kit   Class: E-Rx

## 2019-03-09 ENCOUNTER — Other Ambulatory Visit (INDEPENDENT_AMBULATORY_CARE_PROVIDER_SITE_OTHER): Payer: Self-pay | Admitting: Pharmacist

## 2019-03-09 MED ORDER — HUMIRA(CF) PEN 40 MG/0.4 ML SUBCUTANEOUS KIT
40.00 mg | PEN_INJECTOR | SUBCUTANEOUS | 1 refills | Status: DC
Start: 2019-03-09 — End: 2019-03-14

## 2019-03-09 NOTE — Telephone Encounter (Signed)
Prior authorization required for prescription Humira received by [facility] on 03/09/2019.  Benefits investigation to be completed.  Prior authorization has been submitted to payor [employer].  Waiting for response from payor.  Soyla Dryer, Pharmacy Technician  03/09/2019, 12:18

## 2019-03-13 NOTE — Telephone Encounter (Signed)
Prior Authorization for drug Humira has been approved by payor [employer] from 01/11/19 until 03/10/20. Approval notice has been scanned into Epic and can be found in the Media tab.    Staff message sent to clinic noting the completion of prior authorization.  [facility] Care Coordinator requested that prescription be sent to [facility].    If you have any questions, don't hesitate to contact the [place], Pharmacy Technician  03/13/2019, 16:08

## 2019-03-14 ENCOUNTER — Other Ambulatory Visit (HOSPITAL_BASED_OUTPATIENT_CLINIC_OR_DEPARTMENT_OTHER): Payer: Self-pay | Admitting: Surgical

## 2019-03-14 MED ORDER — HUMIRA(CF) PEN 40 MG/0.4 ML SUBCUTANEOUS KIT
40.00 mg | PEN_INJECTOR | SUBCUTANEOUS | 1 refills | Status: DC
Start: 2019-03-14 — End: 2020-02-26

## 2019-03-14 NOTE — Telephone Encounter (Signed)
-----   Message from Soyla Dryer, Pharmacy Technician sent at 03/13/2019  4:09 PM EDT -----  Chase [name]!  He has been re-auth'd for another year, it looks like he has been filling with [facility] - correct me if I'm wrong, though! Do you mind to send the rx to them, please?   Thanks!  [place]

## 2019-03-15 DIAGNOSIS — M1711 Unilateral primary osteoarthritis, right knee: Secondary | ICD-10-CM | POA: Diagnosis not present

## 2019-03-27 DIAGNOSIS — M1711 Unilateral primary osteoarthritis, right knee: Secondary | ICD-10-CM | POA: Diagnosis not present

## 2019-03-29 DIAGNOSIS — N181 Chronic kidney disease, stage 1: Secondary | ICD-10-CM | POA: Diagnosis not present

## 2019-03-29 DIAGNOSIS — I10 Essential (primary) hypertension: Secondary | ICD-10-CM | POA: Diagnosis not present

## 2019-03-29 DIAGNOSIS — E1169 Type 2 diabetes mellitus with other specified complication: Secondary | ICD-10-CM | POA: Diagnosis not present

## 2019-03-29 DIAGNOSIS — E7800 Pure hypercholesterolemia, unspecified: Secondary | ICD-10-CM | POA: Diagnosis not present

## 2019-04-13 DIAGNOSIS — M1711 Unilateral primary osteoarthritis, right knee: Secondary | ICD-10-CM | POA: Diagnosis not present

## 2019-04-21 DIAGNOSIS — E7800 Pure hypercholesterolemia, unspecified: Secondary | ICD-10-CM | POA: Diagnosis not present

## 2019-04-21 DIAGNOSIS — I10 Essential (primary) hypertension: Secondary | ICD-10-CM | POA: Diagnosis not present

## 2019-04-21 DIAGNOSIS — E1169 Type 2 diabetes mellitus with other specified complication: Secondary | ICD-10-CM | POA: Diagnosis not present

## 2019-04-28 ENCOUNTER — Other Ambulatory Visit: Payer: Self-pay

## 2019-04-28 ENCOUNTER — Encounter (HOSPITAL_BASED_OUTPATIENT_CLINIC_OR_DEPARTMENT_OTHER): Payer: Self-pay | Admitting: Surgical

## 2019-04-28 ENCOUNTER — Ambulatory Visit: Payer: BC Managed Care – PPO | Attending: Surgical | Admitting: Surgical

## 2019-04-28 VITALS — BP 128/80 | HR 60 | Temp 97.8°F | Ht 69.0 in | Wt 214.1 lb

## 2019-04-28 DIAGNOSIS — L4050 Arthropathic psoriasis, unspecified: Secondary | ICD-10-CM | POA: Insufficient documentation

## 2019-04-28 DIAGNOSIS — Z79899 Other long term (current) drug therapy: Secondary | ICD-10-CM | POA: Insufficient documentation

## 2019-04-28 MED ORDER — MELOXICAM 15 MG TABLET
15.0000 mg | ORAL_TABLET | Freq: Every day | ORAL | 2 refills | Status: DC
Start: 2019-04-28 — End: 2019-08-18

## 2019-04-28 NOTE — Progress Notes (Signed)
Subjective  Chase [name] is a 53 y.o. year old male who presents for Psoriatic Arthropathy   to clinic. Last seen April 2019.  He is on Humira every 2 weeks is  tolerating it well.  Reports beign on every 2 week doing over last 6 -7 months. Previously was taking it every 3 weeks. Having more trouble with left 5th PIP and right ankle. Having increased AM stiffness and positive gelling, also in his back. Worse with activity .His psoriasis has been stable.Marland Kitchen He reports knuckles swelling with hot weather. Labs in January .  Will take motrin for headache but not for joint pain.      Current Outpatient Medications   Medication Sig   . adalimumab (HUMIRA,CF, PEN) 40 mg/0.4 mL Subcutaneous Pen Injector Kit 40 mg by Subcutaneous route Every 14 days   . amLODIPine (NORVASC) 10 mg Oral Tablet Take 1 Tab (10 mg total) by mouth Once a day   . Blood Sugar Diagnostic (ONETOUCH VERIO) Strip 1 Strip by In Vitro route Three times a day   . Blood-Glucose Meter (ONETOUCH VERIO IQ METER) Misc 1 Each by In Vitro route Three times a day   . enalapril (VASOTEC) 20 mg Oral Tablet Take 1 Tab (20 mg total) by mouth Once a day TAKE 1 TABLET DAILY   . glipiZIDE (GLUCOTROL XL) 10 mg Oral Tablet Extended Rel 24 hr (2) Take 1 Tab (10 mg total) by mouth Every morning with breakfast   . lancets (ONETOUCH SURESOFT LANCING DEV) 28 gauge Misc 1 Each by In Vitro route Three times a day   . meloxicam (MOBIC) 15 mg Oral Tablet Take 1 Tab (15 mg total) by mouth Once a day   . metoprolol succinate (TOPROL-XL) 50 mg Oral Tablet Sustained Release 24 hr Take 1 Tab (50 mg total) by mouth Once a day   . rosuvastatin (CRESTOR) 20 mg Oral Tablet Take 1 Tab (20 mg total) by mouth Every evening   . Sildenafil (VIAGRA) 50 mg Oral Tablet TAKE 1 TABLET BY MOUTH EVERY 24 HOURS AS NEEDED       Objective  Vitals: BP 134/80  Pulse 89  Temp 36.8 C (98.3 F) (Thermal Scan)   Ht 1.753 m (5' 9.02")  Wt 97.7 kg (215 lb 6.2 oz)  SpO2 98%  BMI 31.79 kg/m2  General: no  acute distress  Eyes: Conjunctiva clear.  Cardiovascular: RRR without murmur.  Lungs: clear to auscultation bilaterally.   Joints: trace fullness of left 5th PIP  Skin: No facial rash no psoriasis       Assessment/Plan  1. Psoriatic Arthritis        1- Psoriatic Arthritis: stable, no active synovitis on exam  Continue on Humira every 2 weeks  Add Mobic 15 mg PRN joint pain  Provided informatin about alternatives to humira.  If Mobic no relief, consider updated imaging ot evaluate for degenerative vs inflammatory changes.   Continue regular monitoring labs with his PCP  RTC in 4 months for F/U or sooner if needed    Orders Placed This Encounter   . meloxicam (MOBIC) 15 mg Oral Tablet     The patient was seen independently   Dessie Coma, PA

## 2019-05-25 DIAGNOSIS — E876 Hypokalemia: Secondary | ICD-10-CM | POA: Diagnosis not present

## 2019-05-26 ENCOUNTER — Encounter (INDEPENDENT_AMBULATORY_CARE_PROVIDER_SITE_OTHER): Payer: Self-pay | Admitting: Family Medicine

## 2019-05-28 LAB — HISTORICAL SURGICAL PATHOLOGY SPECIMEN

## 2019-05-30 ENCOUNTER — Encounter (INDEPENDENT_AMBULATORY_CARE_PROVIDER_SITE_OTHER): Payer: Self-pay | Admitting: Family Medicine

## 2019-05-30 ENCOUNTER — Other Ambulatory Visit (INDEPENDENT_AMBULATORY_CARE_PROVIDER_SITE_OTHER): Payer: Self-pay

## 2019-06-20 DIAGNOSIS — A084 Viral intestinal infection, unspecified: Secondary | ICD-10-CM | POA: Diagnosis not present

## 2019-06-25 IMAGING — DX DG CHEST 2V
2 series · 2 of 2 positions shown · non-contrast
Comparison: None.

CLINICAL DATA: Atypical low sternal chest pain. Patient felt a pop
after a lifting injury at work 10 days ago.

EXAM:
CHEST - 2 VIEW

[chest pa]
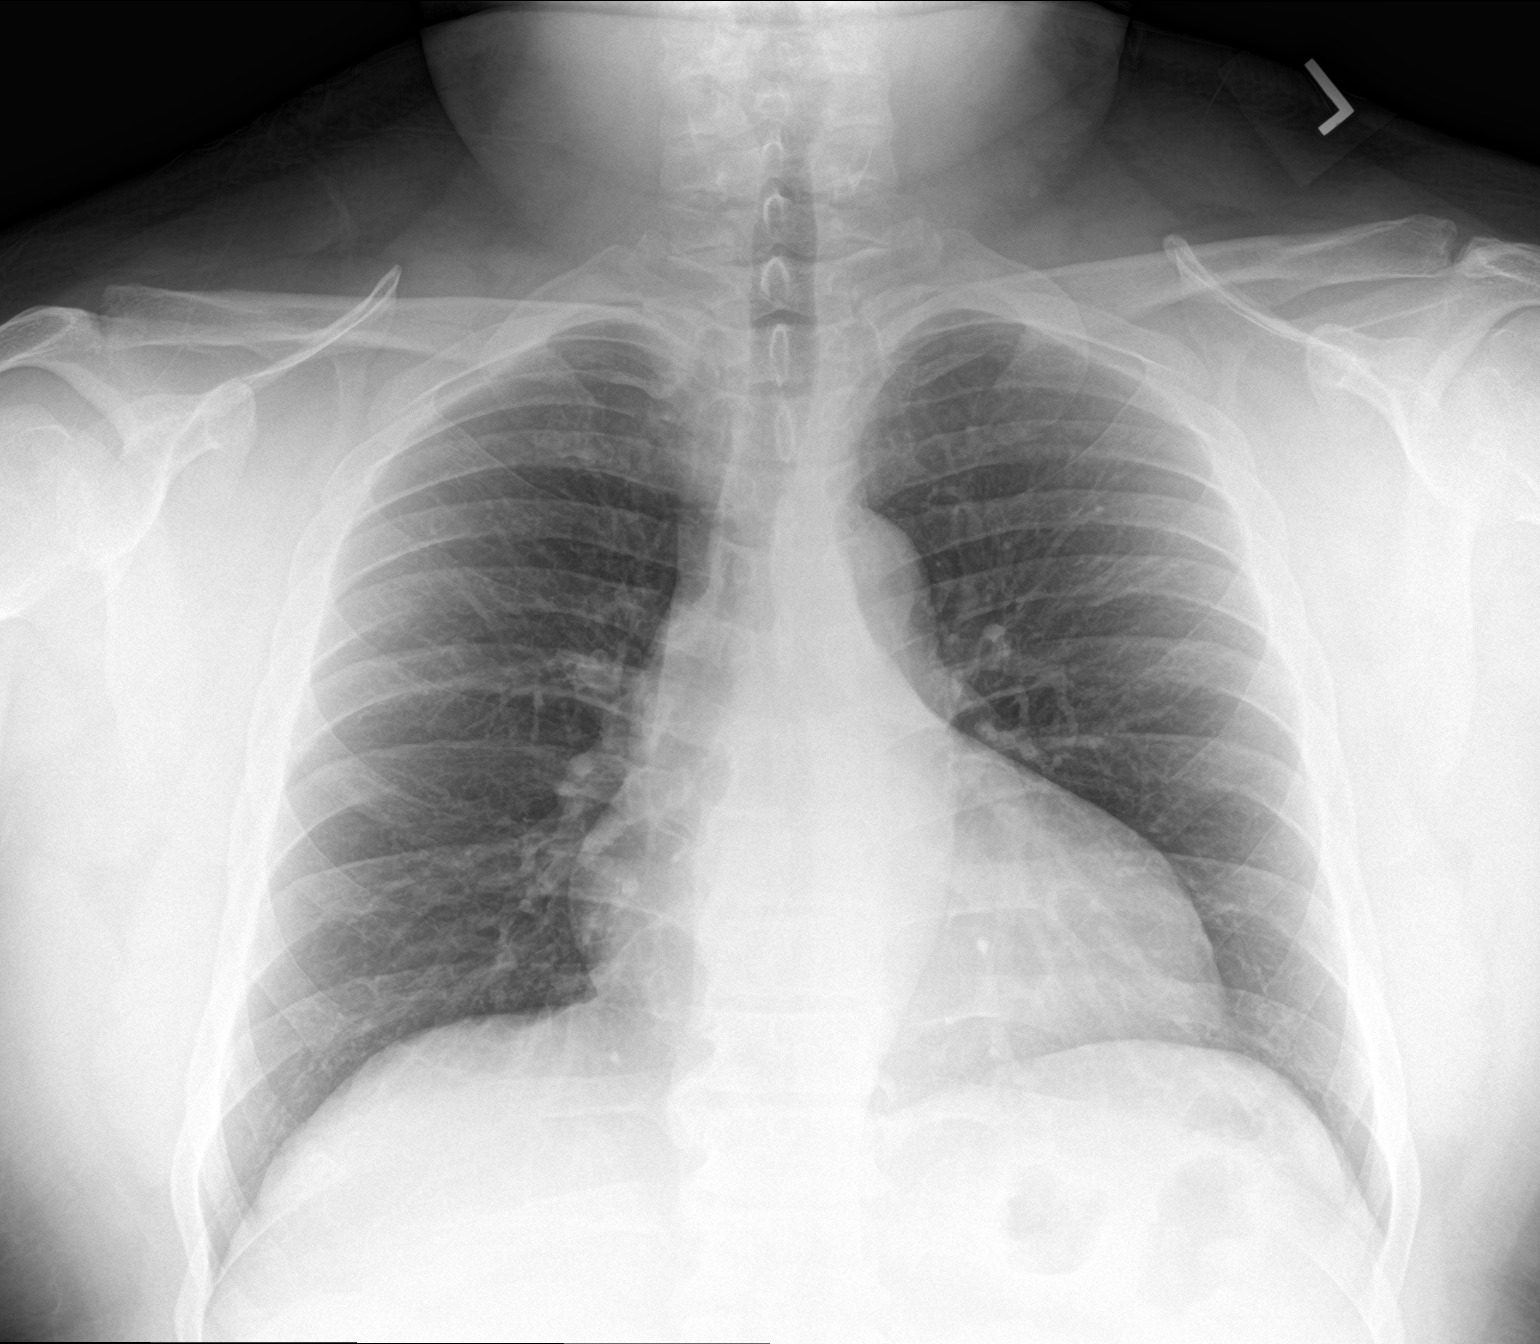

[chest lat]
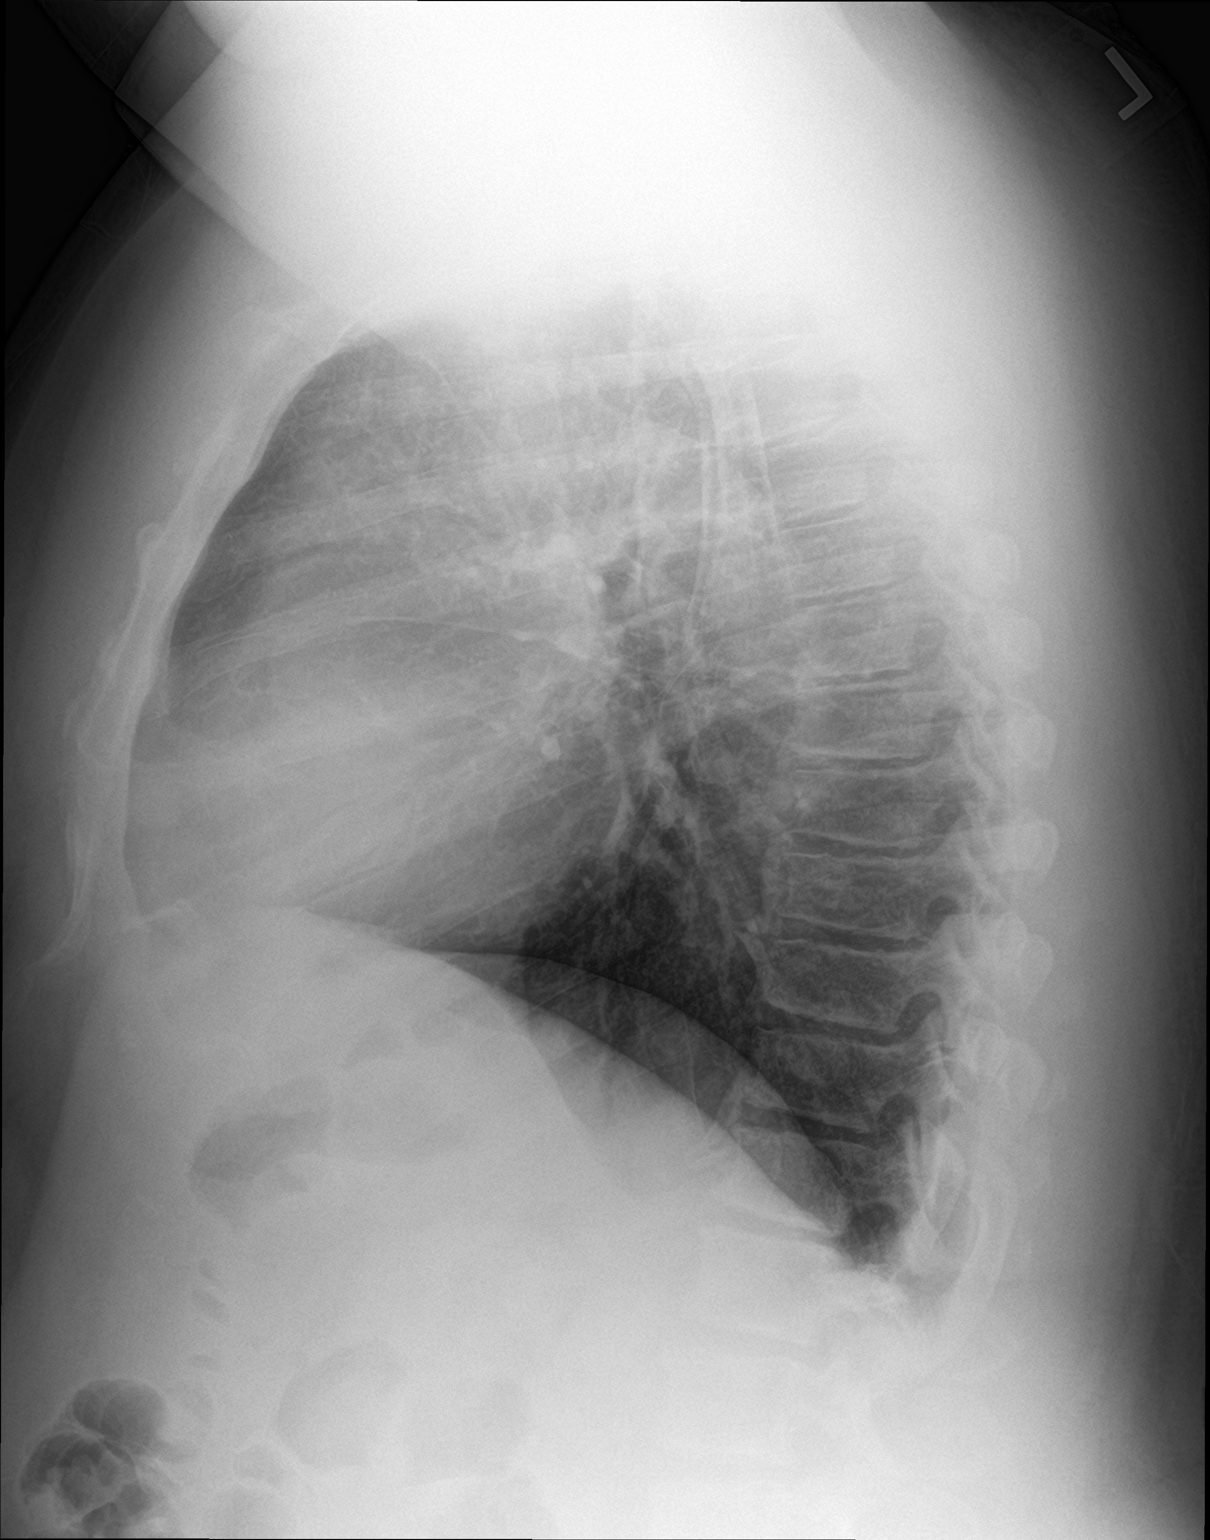

[2 of 2 positions shown; findings below may reference images not displayed]

FINDINGS: Heart size is normal. There is no edema or effusion. No focal
airspace disease is present.
IMPRESSION: Negative two view chest x-ray

## 2019-07-01 ENCOUNTER — Ambulatory Visit (HOSPITAL_COMMUNITY)
Admission: EM | Admit: 2019-07-01 | Discharge: 2019-07-01 | Disposition: A | Discharge status: Home / Self Care | Payer: BLUE CROSS/BLUE SHIELD | Attending: Family Medicine | Admitting: Family Medicine

## 2019-07-01 ENCOUNTER — Encounter (HOSPITAL_COMMUNITY): Payer: Self-pay | Admitting: Emergency Medicine

## 2019-07-01 ENCOUNTER — Other Ambulatory Visit: Payer: Self-pay

## 2019-07-01 DIAGNOSIS — M545 Low back pain, unspecified: Secondary | ICD-10-CM

## 2019-07-01 MED ORDER — CYCLOBENZAPRINE HCL 10 MG PO TABS: ORAL_TABLET | ORAL | 0 refills | Status: DC

## 2019-07-01 MED ORDER — TRAMADOL HCL 50 MG PO TABS: 50.0000 mg | ORAL_TABLET | Freq: Four times a day (QID) | ORAL | 0 refills | Status: DC | PRN

## 2019-07-01 NOTE — Discharge Instructions (Signed)

## 2019-07-01 NOTE — ED Triage Notes (Signed)
Pt states earlier today he twisted his body and now he has lower back pain. Painful standing up.

## 2019-07-03 NOTE — ED Provider Notes (Signed)
[place]   [id number] 07/01/19 Arrival Time: 2703  ASSESSMENT & PLAN:  1. Acute bilateral low back pain without sciatica     Able to ambulate here and hemodynamically stable. No indication for imaging of back at this time given no trauma and normal neurological exam. Discussed.  Trial of: Meds ordered this encounter  Medications  . cyclobenzaprine (FLEXERIL) 10 MG tablet    Sig: Take 1 tablet by mouth 3 times daily as needed for muscle spasm. Warning: May cause drowsiness.    Dispense:  21 tablet    Refill:  0  . traMADol (ULTRAM) 50 MG tablet    Sig: Take 1 tablet (50 mg total) by mouth every 6 (six) hours as needed.    Dispense:  12 tablet    Refill:  0   Medication sedation precautions given. Encourage ROM/movement as tolerated.  Follow-up Information    Maury Dus, MD.   Specialty: Family Medicine Why: As needed. Contact information: [place] [address] [phone]           Reviewed expectations re: course of current medical issues. Questions answered. Outlined signs and symptoms indicating need for more acute intervention. Patient verbalized understanding. After Visit Summary given.   SUBJECTIVE: History from: patient.  MAVERYK RENSTROM is a 53 y.o. male who presents with complaint of fairly persistent bilateral lower back discomfort. Onset gradual, today. Injury/trama: "I kind of twisted my body and felt a pull in my back". History of back problems: none reported. Discomfort described as aching without radiation. Pain is worse with prolonged walking/standing, worse with movements involving back, and better with rest. Progressive LE weakness or saddle anesthesia: none. Extremity sensation changes or weakness: none. Ambulatory without difficulty. Normal bowel/bladder habits: yes; without urinary retention. No associated abdominal pain/n/v. Self treatment: has tried nothing for pain relief.  Reports no chronic  steroid use, fevers, IV drug use, or recent back surgeries or procedures.  ROS: As per HPI. All other systems negative.   OBJECTIVE:  Vitals:   07/01/19 1717  BP: (!) 163/95  Pulse: 73  Resp: 18  Temp: 98.6 F (37 C)  SpO2: 98%    General appearance: alert; no distress Neck: supple with FROM; without midline tenderness CV: RRR Lungs: unlabored respirations; symmetrical air entry Abdomen: soft, non-tender; non-distended Back: moderate bilateral and poorly localized tenderness of his lower paraspinal musculature; FROM at waist; bruising: none; without midline tenderness Extremities: no edema; symmetrical with no gross deformities; normal ROM of bilateral lower extremities Skin: warm and dry Neurologic: normal gait; normal reflexes of RLE and LLE; normal sensation of RLE and LLE; normal strength of RLE and LLE Psychological: alert and cooperative; normal mood and affect   No Known Allergies   Social History   Socioeconomic History  . Marital status: Single    Spouse name: Not on file  . Number of children: Not on file  . Years of education: Not on file  . Highest education level: Not on file  Occupational History  . Not on file  Social Needs  . Financial resource strain: Not on file  . Food insecurity    Worry: Not on file    Inability: Not on file  . Transportation needs    Medical: Not on file    Non-medical: Not on file  Tobacco Use  . Smoking status: Former Research scientist (life sciences)  . Smokeless tobacco: Never Used  Substance and Sexual Activity  . Alcohol use: Not on file  .  Drug use: Not on file  . Sexual activity: Not on file  Lifestyle  . Physical activity    Days per week: Not on file    Minutes per session: Not on file  . Stress: Not on file  Relationships  . Social Musicianconnections    Talks on phone: Not on file    Gets together: Not on file    Attends religious service: Not on file    Active member of club or organization: Not on file    Attends meetings of clubs  or organizations: Not on file    Relationship status: Not on file  . Intimate partner violence    Fear of current or ex partner: Not on file    Emotionally abused: Not on file    Physically abused: Not on file    Forced sexual activity: Not on file  Other Topics Concern  . Not on file  Social History Narrative  . Not on file   FH: Question of HTN  History reviewed. No pertinent surgical history.   Mardella LaymanHagler, Kaitland Lewellyn, MD 07/03/19 [phone]

## 2019-07-07 DIAGNOSIS — M545 Low back pain: Secondary | ICD-10-CM | POA: Diagnosis not present

## 2019-07-11 DIAGNOSIS — R350 Frequency of micturition: Secondary | ICD-10-CM | POA: Diagnosis not present

## 2019-07-11 DIAGNOSIS — N410 Acute prostatitis: Secondary | ICD-10-CM | POA: Diagnosis not present

## 2019-07-12 DIAGNOSIS — M47816 Spondylosis without myelopathy or radiculopathy, lumbar region: Secondary | ICD-10-CM | POA: Diagnosis not present

## 2019-07-12 DIAGNOSIS — S39012D Strain of muscle, fascia and tendon of lower back, subsequent encounter: Secondary | ICD-10-CM | POA: Diagnosis not present

## 2019-07-17 DIAGNOSIS — M47816 Spondylosis without myelopathy or radiculopathy, lumbar region: Secondary | ICD-10-CM | POA: Diagnosis not present

## 2019-07-17 DIAGNOSIS — S39012D Strain of muscle, fascia and tendon of lower back, subsequent encounter: Secondary | ICD-10-CM | POA: Diagnosis not present

## 2019-07-19 DIAGNOSIS — M47816 Spondylosis without myelopathy or radiculopathy, lumbar region: Secondary | ICD-10-CM | POA: Diagnosis not present

## 2019-07-19 DIAGNOSIS — S39012D Strain of muscle, fascia and tendon of lower back, subsequent encounter: Secondary | ICD-10-CM | POA: Diagnosis not present

## 2019-07-31 DIAGNOSIS — M545 Low back pain: Secondary | ICD-10-CM | POA: Diagnosis not present

## 2019-08-04 DIAGNOSIS — M1711 Unilateral primary osteoarthritis, right knee: Secondary | ICD-10-CM | POA: Diagnosis not present

## 2019-08-16 ENCOUNTER — Other Ambulatory Visit: Payer: Self-pay

## 2019-08-18 ENCOUNTER — Other Ambulatory Visit (INDEPENDENT_AMBULATORY_CARE_PROVIDER_SITE_OTHER): Payer: Self-pay | Admitting: Family Medicine

## 2019-08-18 ENCOUNTER — Ambulatory Visit (INDEPENDENT_AMBULATORY_CARE_PROVIDER_SITE_OTHER): Payer: BC Managed Care – PPO | Admitting: Family Medicine

## 2019-08-18 ENCOUNTER — Encounter (INDEPENDENT_AMBULATORY_CARE_PROVIDER_SITE_OTHER): Payer: Self-pay | Admitting: Family Medicine

## 2019-08-18 ENCOUNTER — Other Ambulatory Visit: Payer: Self-pay

## 2019-08-18 ENCOUNTER — Other Ambulatory Visit: Payer: BC Managed Care – PPO | Attending: Family Medicine | Admitting: Family Medicine

## 2019-08-18 ENCOUNTER — Ambulatory Visit (INDEPENDENT_AMBULATORY_CARE_PROVIDER_SITE_OTHER): Payer: BC Managed Care – PPO

## 2019-08-18 VITALS — BP 110/66 | HR 75 | Temp 97.5°F | Resp 16 | Ht 69.0 in | Wt 209.8 lb

## 2019-08-18 DIAGNOSIS — N528 Other male erectile dysfunction: Secondary | ICD-10-CM

## 2019-08-18 DIAGNOSIS — Z6830 Body mass index (BMI) 30.0-30.9, adult: Secondary | ICD-10-CM

## 2019-08-18 DIAGNOSIS — I10 Essential (primary) hypertension: Secondary | ICD-10-CM

## 2019-08-18 DIAGNOSIS — I2510 Atherosclerotic heart disease of native coronary artery without angina pectoris: Secondary | ICD-10-CM

## 2019-08-18 DIAGNOSIS — Z1211 Encounter for screening for malignant neoplasm of colon: Secondary | ICD-10-CM

## 2019-08-18 DIAGNOSIS — E7800 Pure hypercholesterolemia, unspecified: Secondary | ICD-10-CM

## 2019-08-18 DIAGNOSIS — E1100 Type 2 diabetes mellitus with hyperosmolarity without nonketotic hyperglycemic-hyperosmolar coma (NKHHC): Secondary | ICD-10-CM

## 2019-08-18 DIAGNOSIS — Z125 Encounter for screening for malignant neoplasm of prostate: Secondary | ICD-10-CM

## 2019-08-18 DIAGNOSIS — K219 Gastro-esophageal reflux disease without esophagitis: Secondary | ICD-10-CM

## 2019-08-18 DIAGNOSIS — L4050 Arthropathic psoriasis, unspecified: Secondary | ICD-10-CM

## 2019-08-18 LAB — LIPID PANEL
CHOLESTEROL: 104 mg/dL (ref 0–199)
HDL CHOL: 46 mg/dL (ref >40)
LDL DIRECT: 43 mg/dL (ref 0–99)
TRIGLYCERIDES: 126 mg/dL (ref 0–199)
VLDL CALC: 25 mg/dL (ref 0–50)

## 2019-08-18 LAB — COMPREHENSIVE METABOLIC PNL, FASTING
ALBUMIN: 4.6 g/dL (ref 3.2–4.6)
ALKALINE PHOSPHATASE: 52 U/L (ref 20–130)
ALT (SGPT): 33 U/L (ref <=52)
ANION GAP: 5 mmol/L
AST (SGOT): 22 U/L (ref <=35)
BILIRUBIN TOTAL: 0.9 mg/dL (ref 0.3–1.2)
BUN/CREA RATIO: 10
BUN: 12 mg/dL (ref 10–25)
CALCIUM: 9.9 mg/dL (ref 8.8–10.3)
CHLORIDE: 102 mmol/L (ref 98–111)
CO2 TOTAL: 29 mmol/L (ref 21–35)
CREATININE: 1.15 mg/dL (ref <=1.30)
ESTIMATED GFR: >60 mL/min/{1.73_m2}
GLUCOSE: 136 mg/dL — ABNORMAL HIGH (ref 70–110)
POTASSIUM: 4.5 mmol/L (ref 3.5–5.0)
PROTEIN TOTAL: 7.4 g/dL (ref 6.0–8.3)
SODIUM: 136 mmol/L (ref 135–145)

## 2019-08-18 LAB — POCT HGB A1C: POCT HGB A1C: 7 % — AB (ref 4–6)

## 2019-08-18 LAB — MICROALBUMIN/CREATININE RATIO, URINE, RANDOM
CREATININE RANDOM URINE: 148 mg/dL
MICROALBUMIN RANDOM URINE: <0.7 mg/dL (ref 0.0–1.9)

## 2019-08-18 NOTE — Progress Notes (Signed)
[facility]  [address] [place] [address]  Dept Phone: [phone]  Dept Fax: [place]  11-Jul-1966  [id number]    Date of Service: 08/18/2019 10:30 AM EDT    Chief complaint:   Chief Complaint   Patient presents with   . Follow Up 6 Months     Patient is fasting. Requesting referra for colonoscopy closer to home.        Subjective:     This is a case of a 53 y.o. year old male who comes in today for a routine follow up .         The patient's chronic conditions are stable and unchanged . See Problem List for chronic conditions, which have been reviewed today.       Current Outpatient Medications   Medication Sig   . adalimumab (HUMIRA,CF, PEN) 40 mg/0.4 mL Subcutaneous Pen Injector Kit 40 mg by Subcutaneous route Every 14 days   . amLODIPine (NORVASC) 10 mg Oral Tablet Take 1 Tab (10 mg total) by mouth Once a day   . Blood Sugar Diagnostic (ONETOUCH VERIO) Strip 1 Strip by In Vitro route Three times a day   . Blood-Glucose Meter (ONETOUCH VERIO IQ METER) Misc 1 Each by In Vitro route Three times a day   . enalapril (VASOTEC) 20 mg Oral Tablet Take 1 Tab (20 mg total) by mouth Once a day TAKE 1 TABLET DAILY   . glipiZIDE (GLUCOTROL XL) 10 mg Oral Tablet Extended Rel 24 hr (2) Take 1 Tab (10 mg total) by mouth Every morning with breakfast   . lancets (ONETOUCH SURESOFT LANCING DEV) 28 gauge Misc 1 Each by In Vitro route Three times a day   . metoprolol succinate (TOPROL-XL) 50 mg Oral Tablet Sustained Release 24 hr Take 1 Tab (50 mg total) by mouth Once a day   . rosuvastatin (CRESTOR) 20 mg Oral Tablet Take 1 Tab (20 mg total) by mouth Every evening   . Sildenafil (VIAGRA) 50 mg Oral Tablet TAKE 1 TABLET BY MOUTH EVERY 24 HOURS AS NEEDED     Patient Active Problem List   Diagnosis   . Essential hypertension   . Gastroesophageal reflux disease without esophagitis   . Urinary calculus, unspecified   . Psoriatic arthritis (CMS [place])   . Fracture of distal end of  tibia   . Fracture of distal fibula   . Coronary artery disease involving native coronary artery without angina pectoris   . Diabetes mellitus, type 2 (CMS HCC)   . Back pain   . Pure hypercholesterolemia   . Worsening headaches   . Screening PSA (prostate specific antigen)   . Screen for colon cancer   . History of kidney stones   . Right shoulder pain     Past Surgical History:   Procedure Laterality Date   . HX APPENDECTOMY      2017   . HX CARPAL TUNNEL RELEASE  1990    bil   . HX HEART CATHETERIZATION  11/09   . [place]    2010   . LITHOTRIPSY  2004   . PB LAP UMBILICAL HERNIA REPAIR  2009         Family Medical History:     Problem Relation (Age of Onset)    Cancer Father    Diabetes Mother, Father    Hypertension (High Blood Pressure) Mother  Social History     Socioeconomic History   . Marital status: Married     Spouse name: Not on file   . Number of children: Not on file   . Years of education: Not on file   . Highest education level: Not on file   Tobacco Use   . Smoking status: Former Smoker     Types: Cigars   . Smokeless tobacco: Former Systems developer     Types: Snuff     Quit date: 02/22/2017   . Tobacco comment: 3-4 a week   Substance and Sexual Activity   . Alcohol use: Yes     Comment: occassionally   . Drug use: No   Other Topics Concern     Past Surgical History:   Procedure Laterality Date   . COLONOSCOPY N/A 10/23/2016    Performed by Melida Quitter, MD at [facility]   . HX APPENDECTOMY      2017   . HX CARPAL TUNNEL RELEASE  1990    bil   . HX HEART CATHETERIZATION  11/09   . [place]    2010   . LITHOTRIPSY  2004   . PB LAP UMBILICAL HERNIA REPAIR  2009         REVIEW OF SYSTEMS:  General: (-) fevers (-) chills. (-) weight loss. (-) fatigue.  Lymphatic: (-) palpable masses. (-) night sweats.  Heme: (-) easy bruising (-) bleeding. (-) recurrent infections.   HEENT. (-) vision changes (-) hearing changes. (-) dysphagia. (-) sore throat.   Heart: (-) chest pain. (-)  palpitation. (-) orthopnea. (-) LE edema.   Lungs: (-) dyspnea (on exertion) (-) hemoptysis. (-) cough.   Abdomen: (-) poor appetite. (-) abdominal pain. (-) nausea (-) vomiting. (-) diarrhea. (-) constipation.   GU: (-) dysuria (-) Urgency. (-) Hematuria.   MS. (+) joint pain (-) ext swelling. (-) Back pain.   Dermatologic: (-) rashes. (-) pruritus.   Psychiatric: (-) Depression. (-) anxiety. (-) insomnia.   Neurologic: (-) headaches. (-) neuropathy. (-) weakness. (-) memory problems.              Objective:     BP 110/66   Pulse 75   Temp 36.4 C (97.5 F)   Resp 16   Ht 1.753 m (5' 9" )   Wt 95.2 kg (209 lb 12.8 oz)   SpO2 99%   BMI 30.98 kg/m         General appearance: alert, oriented x 3, in his normal state, cooperative, not in apparent distress, appearing stated age   Integumentary:   Overall examination of the patient's skin reveals- no rashes, no suspicious lesions, and no bruises. Normal coloration of skin. Normal skin moisture.  Head and Neck:  Normocephalic and atraumatic. No lesions or palpable masses. Neck is supple with full ROM. No bruit auscultated on the left or right. No nucchal rigidity. No lymphadenopathy. Thyroid:  Normal size and consistency, no palpable nodules, normal position and symmetric. Non tender.   Lungs: clear to auscultation bilaterally, respirations non-labored. No adventitious sounds.   Heart: regular rate and rhythm, S1, S2 normal, no murmur  Abdomen: soft, non-tender. Bowel sounds normal. No abnormal pulsations. No rigidity and no hepatosplenomegaly . Normal active bowel sounds in all quadrants.   Extremities: extremities normal, atraumatic, no cyanosis or edema, pulses intact in upper and lower extremities.  No varicose ulcers.      Psych :  Patient is alert and oriented  and cooperative with the exam. No evidence of hallucinations, delusions or homicidal/suicidal ideations.   Neuro:  Cranial Nerves 2-12 are grossly intact . Coordination is normal . Gait is normal.  No  meningeal signs.     Assessment :       ICD-10-CM    1. Type 2 diabetes mellitus with hyperosmolarity without coma, without long-term current use of insulin (CMS HCC)  E11.00 POCT HGB A1C     COMPREHENSIVE METABOLIC PNL, FASTING     LIPID PANEL     MICROALBUMIN/CREATININE RATIO, URINE, RANDOM    a1c 8.2% 11-25-18   2. Coronary artery disease involving native coronary artery without angina pectoris  I25.10    3. Essential hypertension  I10 COMPREHENSIVE METABOLIC PNL, FASTING     LIPID PANEL   4. Pure hypercholesterolemia  E78.00    5. Gastroesophageal reflux disease without esophagitis  K21.9    6. Psoriatic arthritis (CMS HCC)  L40.50    7. Screen for colon cancer  Z12.11 OUTSIDE CONSULT/REFERRAL PROVIDER(AMB)    10-23-16   8. Screening PSA (prostate specific antigen)  Z12.5     11-25-18 result 0.3         PLAN :   Diabetes Monitors  A1C: 7  A1C Date: 08/18/2019           Nephropathy Screening: On ACEI or ARB    Last Lipid Panel  (Last result in the past 2 years)      Cholesterol   HDL   LDL   Direct LDL   Triglycerides      11/25/18 1122 163 42   107 214        Retinal Exam Date: 05/27/2019  Last diabetic foot exam: Not Found    He is doing much better and so far as his diabetes.  Will continue current medications.    He is up-to-date on screening PSAs.    Will refer him to a general surgeon closer to home for his screening colonoscopy.    He has an appointment coming up with his rheumatologist for his psoriatic arthritis.  He has been having more pain in his ankles.  [name] he is going to have x-rays done soon.    Blood pressure well controlled will continue current medications.  It is little on the low side today.  He said he is not tired.  Will continue his meds as they are.    Continue current treatment regimen. Patient is doing well. Patient is to call with any problems prior to next apppointment if needed.    Will draw fasting labs today.                   PHQ Questionnaire         The patient was given ample  opportunity to ask questions and those questions were answered to the patient's satisfaction. The patient was encouraged to be involved in their own care, and all diagnoses, medications, and medication side-effects were discussed.  A copy of the patient's medication list was printed and given to the patient. A good faith effort was made to reconcile the patient's medications.  The patient is aware that they are to contact me with any additional questions or concerns, or go to the ED in an emergency.     Future Appointments   Date Time Provider March ARB   08/18/2019 10:30 AM Elease Hashimoto, DO [facility] [place]   08/24/2019  1:15 PM Genia Harold, [place]  09/01/2019 10:00 AM [place], Milton Ferguson, PA [place] Maintenance Due   Topic Date Due   . HIV Screening  08/24/1980   . Adult Tdap-Td (1 - Tdap) 08/24/1984   . Shingles Vaccine (1 of 2) 08/25/2015   . Influenza Vaccine (1) 07/18/2019     Health Maintenance   Topic Date Due   . HIV Screening  08/24/1980   . Adult Tdap-Td (1 - Tdap) 08/24/1984   . Shingles Vaccine (1 of 2) 08/25/2015   . Influenza Vaccine (1) 07/18/2019   . Colonoscopy  10/24/2019   . Depression Screening  11/26/2019   . Hemoglobin A1C for Diabetes Control  11/26/2019   . Pneumococcal 19-64 Years Medium Risk  Completed                   This note was partially generated using MModal Fluency Direct system, and there may be some incorrect words, spellings, and punctuation that were not noted in checking the note before saving.    Dwyane Luo, DO    [facility]  [address] [place] [address]  Dept Phone: [phone]  Dept Fax: [phone]

## 2019-08-21 ENCOUNTER — Encounter (INDEPENDENT_AMBULATORY_CARE_PROVIDER_SITE_OTHER): Payer: Self-pay | Admitting: Family Medicine

## 2019-08-21 MED ORDER — SILDENAFIL 50 MG TABLET
50.00 mg | ORAL_TABLET | ORAL | 5 refills | Status: DC | PRN
Start: 2019-08-21 — End: 2019-08-24

## 2019-08-22 ENCOUNTER — Ambulatory Visit (HOSPITAL_COMMUNITY): Payer: Self-pay | Admitting: Surgery

## 2019-08-22 NOTE — Telephone Encounter (Signed)
Appointment given to pt

## 2019-08-22 NOTE — Telephone Encounter (Signed)
-----   Message from [facility] sent at 08/21/2019 10:27 AM EDT -----  Gabriel Cirri from [facility] office is requesting the pt be seen for colonscopy

## 2019-08-24 ENCOUNTER — Other Ambulatory Visit: Payer: Self-pay

## 2019-08-24 ENCOUNTER — Other Ambulatory Visit (HOSPITAL_BASED_OUTPATIENT_CLINIC_OR_DEPARTMENT_OTHER): Payer: Self-pay | Admitting: Medical

## 2019-08-24 ENCOUNTER — Encounter (HOSPITAL_BASED_OUTPATIENT_CLINIC_OR_DEPARTMENT_OTHER): Payer: Self-pay | Admitting: Medical

## 2019-08-24 ENCOUNTER — Ambulatory Visit: Payer: BC Managed Care – PPO | Attending: Medical | Admitting: Medical

## 2019-08-24 DIAGNOSIS — Z87442 Personal history of urinary calculi: Secondary | ICD-10-CM

## 2019-08-24 DIAGNOSIS — N401 Enlarged prostate with lower urinary tract symptoms: Secondary | ICD-10-CM

## 2019-08-24 DIAGNOSIS — R3915 Urgency of urination: Secondary | ICD-10-CM

## 2019-08-24 DIAGNOSIS — R3911 Hesitancy of micturition: Secondary | ICD-10-CM

## 2019-08-24 DIAGNOSIS — N400 Enlarged prostate without lower urinary tract symptoms: Secondary | ICD-10-CM | POA: Insufficient documentation

## 2019-08-24 DIAGNOSIS — R350 Frequency of micturition: Secondary | ICD-10-CM

## 2019-08-24 LAB — POCT URINE DIPSTICK
BILIRUBIN: NEGATIVE
BLOOD: NEGATIVE
GLUCOSE: NEGATIVE
KETONE: NEGATIVE
LEUKOCYTES: NEGATIVE
NITRITE: NEGATIVE
PH: 7.5
PROTEIN: NEGATIVE
SPECIFIC GRAVITY: 1.025
UROBILINOGEN: 0.2

## 2019-08-24 LAB — URINALYSIS, MACROSCOPIC
BILIRUBIN: NEGATIVE mg/dL
BLOOD: NEGATIVE mg/dL
GLUCOSE: NEGATIVE mg/dL
KETONES: NEGATIVE mg/dL
LEUKOCYTES: NEGATIVE WBCs/uL
NITRITE: NEGATIVE
PH: 7.5 — ABNORMAL HIGH (ref 5.0–7.0)
PROTEIN: NEGATIVE mg/dL
SPECIFIC GRAVITY: 1.017 (ref 1.010–1.025)
UROBILINOGEN: 0.2 mg/dL

## 2019-08-24 LAB — POCT PVR

## 2019-08-24 LAB — URINALYSIS, MICROSCOPIC

## 2019-08-24 MED ORDER — TADALAFIL 5 MG TABLET
5.00 mg | ORAL_TABLET | ORAL | 11 refills | Status: AC | PRN
Start: 2019-08-24 — End: 2019-11-22

## 2019-08-24 NOTE — H&P (Signed)
[name] [id number]  Dept: [phone]  Dept Fax: [phone]      Follow up H&P    08/24/2019    Chase [name]  Date of Birth. 1966-07-25  MRN: [id number]  Referring Physician:  No ref. provider found      Chief Complaint   Patient presents with   . Benign Prostatic Hypertrophy       History of present illness:52 y.o. male that was last seen in the office 04/19/2017.  The following italicized segment from prior H&P to provide history:    "53 y.o. male here today for urinary frequency. He is currently urinating every 2-3 hours with a moderate stream, nocturia x 0-1 , occ urgency, he denies dysuria, gross hematuria or urinary incontinence. The patient does usually feel as though the bladder empties well. The post void residual today in office is 12 cc. He denies any FH of PCa. He states he was recently told he had cysts on his left testicle. Upon examination , he has a small left spermatocele NTTP. Urine dip today is negative. Prior history of kidney stone requiring stents. He denies any recent imaging."     No issues with stones since his last office appointment. He states for the past 3-4 weeks he is having to wake up to void with some hesitancy. During the day he voids every few hours with a moderate stream. He denies dysuria or gross hematuria. Urine dip today is negative, PVR= 0 cc. PSA from 11/25/2018=0.3.      PMH:   Past Medical History:   Diagnosis Date   . Arthritis with psoriasis (CMS HCC)    . Arthropathy     psoriatic   . CAD (coronary artery disease)    . Diabetes mellitus, type 2 (CMS [place]) 03/19/2009   . Fracture of distal end of tibia     left   . Fracture of distal fibula     left   . GERD (gastroesophageal reflux disease)    . Hypertension    . Kidney stones    . Right shoulder pain    . Type 2 diabetes mellitus (CMS HCC)    . Urinary calculus, unspecified     Renal stones         Past Surgical History:   Procedure Laterality Date   . HX APPENDECTOMY      2017      . HX CARPAL TUNNEL RELEASE  1990    bil   . HX HEART CATHETERIZATION  11/09   . [place]    2010   . LITHOTRIPSY  2004   . PB LAP UMBILICAL HERNIA REPAIR  2009         Current Outpatient Medications   Medication Sig   . adalimumab (HUMIRA,CF, PEN) 40 mg/0.4 mL Subcutaneous Pen Injector Kit 40 mg by Subcutaneous route Every 14 days   . amLODIPine (NORVASC) 10 mg Oral Tablet Take 1 Tab (10 mg total) by mouth Once a day   . Blood Sugar Diagnostic (ONETOUCH VERIO) Strip 1 Strip by In Vitro route Three times a day   . Blood-Glucose Meter (ONETOUCH VERIO IQ METER) Misc 1 Each by In Vitro route Three times a day   . enalapril (VASOTEC) 20 mg Oral Tablet Take 1 Tab (20 mg total) by mouth Once a day TAKE 1 TABLET DAILY   . glipiZIDE (GLUCOTROL XL) 10 mg Oral Tablet Extended Rel 24 hr (  2) Take 1 Tab (10 mg total) by mouth Every morning with breakfast   . lancets (ONETOUCH SURESOFT LANCING DEV) 28 gauge Misc 1 Each by In Vitro route Three times a day   . metoprolol succinate (TOPROL-XL) 50 mg Oral Tablet Sustained Release 24 hr Take 1 Tab (50 mg total) by mouth Once a day   . rosuvastatin (CRESTOR) 20 mg Oral Tablet Take 1 Tab (20 mg total) by mouth Every evening   . Sildenafil (VIAGRA) 50 mg Oral Tablet Take 1 Tab (50 mg total) by mouth Every 24 hours as needed     No Known Allergies  Social History     Socioeconomic History   . Marital status: Married     Spouse name: Not on file   . Number of children: Not on file   . Years of education: Not on file   . Highest education level: Not on file   Occupational History   . Not on file   Social Needs   . Financial resource strain: Not on file   . Food insecurity     Worry: Not on file     Inability: Not on file   . Transportation needs     Medical: Not on file     Non-medical: Not on file   Tobacco Use   . Smoking status: Former Smoker     Types: Cigars   . Smokeless tobacco: Former Systems developer     Types: Snuff     Quit date: 02/22/2017   . Tobacco comment: 3-4 a week    Substance and Sexual Activity   . Alcohol use: Yes     Comment: occassionally   . Drug use: No   . Sexual activity: Not on file   Lifestyle   . Physical activity     Days per week: Not on file     Minutes per session: Not on file   . Stress: Not on file   Relationships   . Social Product manager on phone: Not on file     Gets together: Not on file     Attends religious service: Not on file     Active member of club or organization: Not on file     Attends meetings of clubs or organizations: Not on file     Relationship status: Not on file   . Intimate partner violence     Fear of current or ex partner: Not on file     Emotionally abused: Not on file     Physically abused: Not on file     Forced sexual activity: Not on file   Other Topics Concern   . Ability to Walk 1 Flight of Steps without SOB/CP Not Asked   . Routine Exercise Not Asked   . Ability to Walk 2 Flight of Steps without SOB/CP Not Asked   . Unable to Ambulate Not Asked   . Total Care Not Asked   . Ability To Do Own ADL's Not Asked   . Uses Walker Not Asked   . Other Activity Level Not Asked   . Uses Cane Not Asked   Social History Narrative   . Not on file     Family Medical History:     Problem Relation (Age of Onset)    Cancer Father    Diabetes Mother, Father    Hypertension (High Blood Pressure) Mother  Review of systems:    As per history of present illness or negative for constitutional symptoms, ENT, cardiovascular, respiratory, gastrointestinal, genitourinary; musculoskeletal, skin and/or breasts, neurological, psychiatric, endocrine, hematologic, lymphatic, and allergic immunologic. Also, as per urology H&P forms scanned into the computer.    Physical examination:    There were no vitals taken for this visit.       General: Well-developed, well-nourished male in no apparent distress.  HEENT: Grossly normal.  Chest: Normal excursion.  Abdomen: Soft, without tenderness or mass.  DRE: soft smooth symmetric  Skin: Warm and  dry.  Extremities: Moves all extremities.  Neurologic: Alert and oriented in three spheres.    Labs:     Lab Results   Component Value Date/Time    COLOR Yellow 08/24/2019    SPECGRAVUR 1.024 04/19/2017 10:50 AM    PHURINE 6.5 04/19/2017 10:50 AM    PROTEIN neg 08/24/2019    GLUCOSE neg 08/24/2019    KETONES Negative 04/19/2017 10:50 AM    UROBILINOGEN 0.2 08/24/2019    LEUKOCYTES neg 08/24/2019    NITRITE neg 08/24/2019    WBC 5.6 03/04/2018    WBC 5.6 03/04/2018    RBC 5.59 03/04/2018    BACTERIA None 04/19/2017 10:50 AM    BILIRUBIN neg 08/24/2019    BLOOD neg 08/24/2019    HGBURINE Negative 04/19/2017 10:50 AM    SQUAEPIT None 04/19/2017 10:50 AM         Results for orders placed or performed in visit on 08/24/19 (from the past 29 hour(s))   POCT PVR    Collection Time: 08/24/19 12:00 AM   Result Value Ref Range    URINE TOTAL VOLUME 067m    POCT URINE DIPSTICK    Collection Time: 08/24/19 12:00 AM   Result Value Ref Range    LEUKOCYTES neg     NITRITE neg     UROBILINOGEN 0.2     PROTEIN neg     PH 7.5     BLOOD neg     SPECIFIC GRAVITY 1.025     KETONE neg     BILIRUBIN neg     GLUCOSE neg     COLOR Yellow     CLARITY clear          Recent Labs     11/25/18  1122   PSA 0.3       Lab Results   Component Value Date    CREATININE 1.15 08/18/2019         Radiographic data:      No results found for this or any previous visit (from the past [id number](s)).      Procedures:    0 cc      Impression:    1. Benign prostatic hyperplasia, unspecified whether lower urinary tract symptoms present    2. Frequency of urination    3. Urgency of urination    4. History of kidney stones              Plan:    No follow-ups on file.    1.Patient instructed to call office if any problems or changes in condition.  2. UA.CS today  3. Stop Viagra  4. Trial of Cialis 5 mg daily  5. KUB soon  6. RTO 1 year with a PSA prior    Orders Placed This Encounter   . URINE CULTURE   . URINALYSIS, MACROSCOPIC AND MICROSCOPIC   . URINALYSIS,  MACROSCOPIC   . URINALYSIS, MICROSCOPIC   .  POCT PVR   . POCT URINE DIPSTICK         Genia Harold, [facility]

## 2019-08-24 NOTE — Addendum Note (Signed)
Addended by: Wilfrid Lund on: 08/24/2019 01:47 PM     Modules accepted: Orders

## 2019-08-25 ENCOUNTER — Encounter (HOSPITAL_BASED_OUTPATIENT_CLINIC_OR_DEPARTMENT_OTHER): Payer: Self-pay | Admitting: Medical

## 2019-08-27 LAB — URINE CULTURE: URINE CULTURE: NO GROWTH

## 2019-09-01 ENCOUNTER — Other Ambulatory Visit: Payer: Self-pay

## 2019-09-01 ENCOUNTER — Ambulatory Visit (HOSPITAL_BASED_OUTPATIENT_CLINIC_OR_DEPARTMENT_OTHER): Payer: BC Managed Care – PPO | Admitting: Surgical

## 2019-09-01 ENCOUNTER — Ambulatory Visit
Admission: RE | Admit: 2019-09-01 | Discharge: 2019-09-01 | Disposition: A | Discharge status: Home / Self Care | Payer: BC Managed Care – PPO | Source: Ambulatory Visit | Attending: Surgical | Admitting: Surgical

## 2019-09-01 ENCOUNTER — Ambulatory Visit (HOSPITAL_BASED_OUTPATIENT_CLINIC_OR_DEPARTMENT_OTHER): Payer: BC Managed Care – PPO

## 2019-09-01 ENCOUNTER — Encounter (HOSPITAL_BASED_OUTPATIENT_CLINIC_OR_DEPARTMENT_OTHER): Payer: Self-pay | Admitting: Surgical

## 2019-09-01 VITALS — BP 132/74 | HR 72 | Temp 98.2°F | Wt 212.7 lb

## 2019-09-01 DIAGNOSIS — L4050 Arthropathic psoriasis, unspecified: Secondary | ICD-10-CM

## 2019-09-01 DIAGNOSIS — M19072 Primary osteoarthritis, left ankle and foot: Secondary | ICD-10-CM

## 2019-09-01 DIAGNOSIS — M25571 Pain in right ankle and joints of right foot: Secondary | ICD-10-CM

## 2019-09-01 DIAGNOSIS — M545 Low back pain, unspecified: Secondary | ICD-10-CM

## 2019-09-01 DIAGNOSIS — M5135 Other intervertebral disc degeneration, thoracolumbar region: Secondary | ICD-10-CM

## 2019-09-01 DIAGNOSIS — M25579 Pain in unspecified ankle and joints of unspecified foot: Secondary | ICD-10-CM

## 2019-09-01 DIAGNOSIS — M47817 Spondylosis without myelopathy or radiculopathy, lumbosacral region: Secondary | ICD-10-CM

## 2019-09-01 DIAGNOSIS — M160 Bilateral primary osteoarthritis of hip: Secondary | ICD-10-CM

## 2019-09-01 DIAGNOSIS — M180 Bilateral primary osteoarthritis of first carpometacarpal joints: Secondary | ICD-10-CM

## 2019-09-01 DIAGNOSIS — Z79899 Other long term (current) drug therapy: Secondary | ICD-10-CM | POA: Insufficient documentation

## 2019-09-01 DIAGNOSIS — M25572 Pain in left ankle and joints of left foot: Secondary | ICD-10-CM

## 2019-09-01 DIAGNOSIS — M19071 Primary osteoarthritis, right ankle and foot: Secondary | ICD-10-CM

## 2019-09-01 NOTE — Progress Notes (Signed)
Subjective  Chase [name] is a 53 y.o. year old male who presents for Psoriatic Arthropathy   to clinic. Last seen June 2020.  He is on Humira every 2 weeks is tolerating it well. Having more trouble ankles and low back. Has 10 minute morning stiffness and 10 minute positive gelling.   His psoriasis has been stable.  Tried Mobic after last visit, no help.  Denies any associated joint swelling with pain. Denies back pain will wake up at night.  Back does not bother with activity, only when bending at waist.  Ankles only bother after sitting.           Current Outpatient Medications   Medication Sig   . adalimumab (HUMIRA,CF, PEN) 40 mg/0.4 mL Subcutaneous Pen Injector Kit 40 mg by Subcutaneous route Every 14 days   . amLODIPine (NORVASC) 10 mg Oral Tablet Take 1 Tab (10 mg total) by mouth Once a day   . Blood Sugar Diagnostic (ONETOUCH VERIO) Strip 1 Strip by In Vitro route Three times a day   . Blood-Glucose Meter (ONETOUCH VERIO IQ METER) Misc 1 Each by In Vitro route Three times a day   . enalapril (VASOTEC) 20 mg Oral Tablet Take 1 Tab (20 mg total) by mouth Once a day TAKE 1 TABLET DAILY   . glipiZIDE (GLUCOTROL XL) 10 mg Oral Tablet Extended Rel 24 hr (2) Take 1 Tab (10 mg total) by mouth Every morning with breakfast   . lancets (ONETOUCH SURESOFT LANCING DEV) 28 gauge Misc 1 Each by In Vitro route Three times a day   . metoprolol succinate (TOPROL-XL) 50 mg Oral Tablet Sustained Release 24 hr Take 1 Tab (50 mg total) by mouth Once a day   . rosuvastatin (CRESTOR) 20 mg Oral Tablet Take 1 Tab (20 mg total) by mouth Every evening   . Tadalafil (CIALIS) 5 mg Oral Tablet Take 1 Tab (5 mg total) by mouth Every 24 hours as needed for up to 90 days       Objective  Vitals: BP 134/80  Pulse 89  Temp 36.8 C (98.3 F) (Thermal Scan)   Ht 1.753 m (5' 9.02")  Wt 97.7 kg (215 lb 6.2 oz)  SpO2 98%  BMI 31.79 kg/m2  General: no acute distress  Eyes: Conjunctiva clear.  Cardiovascular: RRR without murmur.  Lungs:  clear to auscultation bilaterally.   Joints: trace fullness of left 5th PIP, normal ROM of ankles, no swelling or pain on palpation  Skin: No facial rash no psoriasis       Assessment/Plan  1. Psoriatic Arthritis    2. Ankle pain, unspecified chronicity, unspecified laterality    3. Low back pain        1- Psoriatic Arthritis: stable, no active synovitis on exam  Continue on Humira every 2 weeks.  Update imaging since continued joint pain to rule out PsA vs OA to evaluate and consider for treatment options     Continue regular monitoring labs with his PCP  RTC in 4 months for F/U or sooner if needed    Orders Placed This Encounter   . XR HAND AND WRIST BI, ARTHRITIS SERIES   . XR ANKLES BILAT WEIGHT BEARING   . XR LUMBAR SPINE SERIES   . XR LOW PELVIS W BILATERAL HIPS     Milton Ferguson [place], [place]

## 2019-10-02 ENCOUNTER — Ambulatory Visit (HOSPITAL_COMMUNITY): Payer: Self-pay | Admitting: Surgery

## 2019-10-10 ENCOUNTER — Ambulatory Visit (HOSPITAL_BASED_OUTPATIENT_CLINIC_OR_DEPARTMENT_OTHER): Payer: BC Managed Care – PPO | Admitting: Surgery

## 2019-10-10 ENCOUNTER — Other Ambulatory Visit: Payer: Self-pay

## 2019-10-10 ENCOUNTER — Encounter (INDEPENDENT_AMBULATORY_CARE_PROVIDER_SITE_OTHER): Payer: Self-pay | Admitting: Surgery

## 2019-10-10 DIAGNOSIS — D126 Benign neoplasm of colon, unspecified: Secondary | ICD-10-CM

## 2019-10-10 NOTE — H&P (Signed)
GENERAL SURGERY, [place]      Date of Service:  10/10/2019  Chase, [name], 53 y.o. male  Date of Birth:  11-18-1965    Service: General Surgery   PCP:  Elease Hashimoto, DO  Referring:  Elease Hashimoto, DO    SUBJECTIVE    HPI:  Chase [name] is a 53 y.o., White male who presents for evaluation of possible need for a repeat colonoscopy.  The patient did have a colonoscopy in December of 2017 and at that time was noted to have 3 small polyps less than 1 cm that were completely removed according to the report.    The patient does not have any issues with his bowels.  He does go on a fairly normal basis without any significant constipation or diarrhea.  He does not have any significant abdominal pain or diverticular disease.  He has not had any previous decreased caliber of stools or bleeding.  He does not have a family history of colon carcinoma.    Information Obtained from: patient and/or medical record    ROS:   Constitutional:  No fevers chills or weight gain or loss  HEENT:    No rhinorrhea, no difficulties with ear infections  Cardiovascular:  No current history of chest pain   Pulmonary:  No current hemoptysis or shortness of breath  Gastrointestinal:   As above  Genitourinary: no hematuria or difficulty with urination  Extremities: no cyanosis clubbing or edema  Neurological: no syncopal episodes  Skin: no significant rashes  Hematological: no previous history of hepatitis or HIV      Past Medical History:   Diagnosis Date   . Arthritis with psoriasis (CMS HCC)    . Arthropathy     psoriatic   . CAD (coronary artery disease)    . Diabetes mellitus, type 2 (CMS [place]) 03/19/2009   . Fracture of distal end of tibia     left   . Fracture of distal fibula     left   . GERD (gastroesophageal reflux disease)    . Hypertension    . Kidney stones    . Right shoulder pain    . Type 2 diabetes mellitus (CMS HCC)    . Urinary calculus, unspecified     Renal stones       Past Surgical  History:   Procedure Laterality Date   . HX APPENDECTOMY      2017   . HX CARPAL TUNNEL RELEASE  1990    bil   . HX HEART CATHETERIZATION  11/09   . [place]    2010   . LITHOTRIPSY  2004   . PB LAP UMBILICAL HERNIA REPAIR  2009       Current Outpatient Medications   Medication Sig   . adalimumab (HUMIRA,CF, PEN) 40 mg/0.4 mL Subcutaneous Pen Injector Kit 40 mg by Subcutaneous route Every 14 days   . amLODIPine (NORVASC) 10 mg Oral Tablet Take 1 Tab (10 mg total) by mouth Once a day   . Blood Sugar Diagnostic (ONETOUCH VERIO) Strip 1 Strip by In Vitro route Three times a day   . Blood-Glucose Meter (ONETOUCH VERIO IQ METER) Misc 1 Each by In Vitro route Three times a day   . enalapril (VASOTEC) 20 mg Oral Tablet Take 1 Tab (20 mg total) by mouth Once a day TAKE 1 TABLET DAILY   . glipiZIDE (GLUCOTROL XL) 10 mg Oral Tablet  Extended Rel 24 hr (2) Take 1 Tab (10 mg total) by mouth Every morning with breakfast   . lancets (ONETOUCH SURESOFT LANCING DEV) 28 gauge Misc 1 Each by In Vitro route Three times a day   . metoprolol succinate (TOPROL-XL) 50 mg Oral Tablet Sustained Release 24 hr Take 1 Tab (50 mg total) by mouth Once a day   . rosuvastatin (CRESTOR) 20 mg Oral Tablet Take 1 Tab (20 mg total) by mouth Every evening   . Tadalafil (CIALIS) 5 mg Oral Tablet Take 1 Tab (5 mg total) by mouth Every 24 hours as needed for up to 90 days       Allergies  No Known Allergies    Family History  family history includes Cancer in his father; Diabetes in his father and mother; Hypertension (High Blood Pressure) in his mother.    Social History   reports that he has quit smoking. His smoking use included cigars. His smokeless tobacco use includes snuff. He reports current alcohol use. He reports that he does not use drugs.    PHYSICAL EXAM  BP 102/68   Pulse 80   Temp 36.2 C (97.2 F)   Resp 20   Ht 1.753 m (_0 )   Wt 98.4 kg (217 lb)   BMI 32.05 kg/m       Constitutional: This is a well-developed male,   Who appears to be in no acute distress   Skin:  There is no significant abnormal rashes on the patient's body  HENT:  Neck is soft, supple, no JVD, no carotid bruits, no obvious thyroidal enlargement  Cardiovascular:  Heart is regular rate and rhythm without murmurs, rubs or gallops  Respiratory:  Lungs are clear to auscultation bilaterally  Gastrointestinal:   Abdomen is soft, nontender, nondistended, with no peritoneal signs  Extremities:  No cyanosis, clubbing, or edema  Neurological:  Patient is grossly intact in relationship to the upper and lower extremities  Psych:   No depressive affect      Assessment/Plan:   (D12.6) Tubular adenoma of colon  Plan:  I did review the patient's colonoscopy results as well as his previous path report.  He did have 2 small less than 1 cm adenomas that were completely excised.  I do not believe currently that he needs a repeat colonoscopy until 5 years after his previous colonoscopy.  We will get him set up for a follow-up in 2 years to have his colonoscopy completed.  I did discuss with him that he has issues with his bowels prior to this time that he does need to present back to the office.      Patient was seen and evaluated and findings were communicated with referring physician.    Chase Hai, MD

## 2019-10-26 ENCOUNTER — Emergency Department
Admission: EM | Admit: 2019-10-26 | Discharge: 2019-10-26 | Disposition: A | Discharge status: Home / Self Care | Payer: BC Managed Care – PPO | Attending: Emergency Medicine | Admitting: Emergency Medicine

## 2019-10-26 ENCOUNTER — Emergency Department (HOSPITAL_COMMUNITY): Payer: BC Managed Care – PPO

## 2019-10-26 ENCOUNTER — Other Ambulatory Visit: Payer: Self-pay

## 2019-10-26 ENCOUNTER — Encounter (HOSPITAL_COMMUNITY): Payer: Self-pay

## 2019-10-26 DIAGNOSIS — I517 Cardiomegaly: Secondary | ICD-10-CM

## 2019-10-26 DIAGNOSIS — R079 Chest pain, unspecified: Secondary | ICD-10-CM | POA: Insufficient documentation

## 2019-10-26 DIAGNOSIS — R001 Bradycardia, unspecified: Secondary | ICD-10-CM

## 2019-10-26 DIAGNOSIS — M25532 Pain in left wrist: Secondary | ICD-10-CM | POA: Insufficient documentation

## 2019-10-26 LAB — COMPREHENSIVE METABOLIC PANEL, NON-FASTING
ALBUMIN/GLOBULIN RATIO: 1.5 (ref 1.0–2.0)
ALBUMIN: 4.7 g/dL (ref 3.5–5.0)
ALKALINE PHOSPHATASE: 53 U/L (ref 38–126)
ALT (SGPT): 30 U/L (ref <50)
ANION GAP: 9 mmol/L (ref 8–16)
AST (SGOT): 29 U/L (ref 15–46)
BILIRUBIN TOTAL: 0.5 mg/dL (ref 0.2–1.3)
BUN/CREA RATIO: 14
BUN: 16 mg/dL (ref 9–20)
CALCIUM: 9.7 mg/dL (ref 8.4–10.2)
CHLORIDE: 104 mmol/L (ref 98–107)
CO2 TOTAL: 27 mmol/L (ref 22–30)
CREATININE: 1.17 mg/dL (ref 0.80–1.70)
ESTIMATED GFR: >60 mL/min/{1.73_m2} (ref >60)
GLUCOSE: 124 mg/dL — ABNORMAL HIGH (ref 74–106)
POTASSIUM: 4.1 mmol/L (ref 3.5–5.1)
PROTEIN TOTAL: 7.9 g/dL (ref 6.3–8.2)
SODIUM: 140 mmol/L (ref 137–145)

## 2019-10-26 LAB — CBC WITH DIFF
BASOPHIL %: 1 % (ref 0–3)
EOSINOPHIL %: 3 % (ref 0–7)
HCT: 45.9 % (ref 43.5–53.7)
HGB & HCT check: 2.4
HGB: 16.1 g/dL (ref 14.1–18.1)
LYMPHOCYTE %: 43 % (ref 10–50)
MCH: 30.8 pg (ref 27.0–31.2)
MCHC: 35.1 g/dL (ref 31.8–35.4)
MCV: 87.9 fL (ref 80.0–97.0)
MONOCYTE %: 9 % (ref 0–12)
NEUTROPHIL %: 45 % (ref 37–80)
PLATELETS: 230 10*3/uL (ref 142–424)
RBC: 5.22 10*6/uL (ref 4.69–6.13)
RDW: 13.5 % (ref 11.6–14.9)
WBC: 7.6 10*3/uL (ref 4.6–10.2)

## 2019-10-26 LAB — CREATINE KINASE (CK), TOTAL, SERUM OR PLASMA
CREATINE KINASE: 169 U/L — ABNORMAL HIGH (ref 30–135)
CREATINE KINASE: 141 U/L — ABNORMAL HIGH (ref 30–135)

## 2019-10-26 LAB — GRAY TOP TUBE

## 2019-10-26 LAB — TROPONIN-I
TROPONIN I: <0.01 ng/mL — ABNORMAL LOW (ref 0.03–0.11)
TROPONIN I: <0.01 ng/mL — ABNORMAL LOW (ref 0.03–0.11)

## 2019-10-26 LAB — BLUE TOP TUBE

## 2019-10-26 NOTE — Discharge Instructions (Addendum)
The chest x-ray and EKGs were negative for acute abnormality.  Both troponins were normal.  The creatinine kinase was slightly elevated initially and that has improved on repeat labs.  Please follow-up with your primary care provider or cardiologist regarding her chest pain.  Return to the emergency department for worsening pain, difficulty breathing, or any other concerning symptoms.

## 2019-10-26 NOTE — ED Triage Notes (Signed)
Pt ambulated into ed with c/o chest pain and left wrist pain. Pt stated the pain began after he ate dinner and sat down to talk with his grandkids. Pt denies shortness of breath or nausea.

## 2019-10-26 NOTE — ED Nurses Note (Signed)
Pt lying on cot watching television, denies chest pain or any needs at this time. Call bell within reach.

## 2019-10-26 NOTE — ED Provider Notes (Signed)
Chase [name]   11/26/1965   53 y.o.   male   [id number]     Chief Complaint:   Chief Complaint   Patient presents with   . Chest Pain         HPI: This is a 53 y.o. male who presents to the emergency department for evaluation.  Patient is complaining of left-sided chest pain and left wrist pain.  Patient reports that he sat down after dinner and developed left-sided chest pain.  He denies any increased stress or anything different with the meal.  He denies any nausea or vomiting.  He became more concerned when he developed left wrist pain which prompted him to come to the emergency department.  He reports a history of high blood pressure but denies any coronary artery disease.  ?   Past Medical History:   Past Medical History:   Diagnosis Date   . Arthritis with psoriasis (CMS HCC)    . Arthropathy     psoriatic   . CAD (coronary artery disease)    . Diabetes mellitus, type 2 (CMS [place]) 03/19/2009   . Fracture of distal end of tibia     left   . Fracture of distal fibula     left   . GERD (gastroesophageal reflux disease)    . Hypertension    . Kidney stones    . Right shoulder pain    . Type 2 diabetes mellitus (CMS HCC)    . Urinary calculus, unspecified     Renal stones      Past Surgical History:   Past Surgical History:   Procedure Laterality Date   . Hx appendectomy     . Hx carpal tunnel release  1990   . Hx heart catheterization  11/09   . Hx hernia repair  1970   . Lithotripsy  2004   . Pb lap umbilical hernia repair  2009      Social History:   Social History     Tobacco Use   . Smoking status: Former Smoker     Types: Cigars   . Smokeless tobacco: Former Systems developer     Types: Snuff     Quit date: 10/26/2019   . Tobacco comment: 3-4 a week   Substance Use Topics   . Alcohol use: Yes     Comment: occassionally   . Drug use: No      Social History     Substance and Sexual Activity   Drug Use No      Allergies: No Known Allergies       Review of Systems   Constitutional: Negative.    HENT: Negative.    Eyes: Negative.     Respiratory: Negative for cough and shortness of breath.    Cardiovascular: Positive for chest pain. Negative for palpitations.   Gastrointestinal: Negative for abdominal pain, nausea and vomiting.   Genitourinary: Negative.    Musculoskeletal: Positive for joint pain. Negative for back pain.   Skin: Negative.    Neurological: Negative.           BP 123/72   Pulse 66   Temp 36.7 C (98 F)   Resp 16   Ht 1.765 m (5' 9.5")   Wt 97.5 kg (215 lb)   SpO2 96%   BMI 31.29 kg/m        Physical Exam  Vitals and nursing note reviewed.   Constitutional:       Comments: Overweight male appears  in no acute distress but does appear uncomfortable secondary to symptoms.   HENT:      Head: Normocephalic.      Right Ear: External ear normal.      Left Ear: External ear normal.      Mouth/Throat:      Mouth: Mucous membranes are moist.   Eyes:      Extraocular Movements: Extraocular movements intact.      Pupils: Pupils are equal, round, and reactive to light.   Cardiovascular:      Rate and Rhythm: Normal rate and regular rhythm.      Pulses: Normal pulses.      Heart sounds: Normal heart sounds.   Pulmonary:      Effort: Pulmonary effort is normal.      Breath sounds: Normal breath sounds.   Chest:      Chest wall: No tenderness.   Abdominal:      General: Bowel sounds are normal.      Palpations: Abdomen is soft.      Tenderness: There is no abdominal tenderness.   Musculoskeletal:         General: No tenderness or deformity. Normal range of motion.      Cervical back: Normal range of motion and neck supple.   Skin:     General: Skin is warm and dry.   Neurological:      General: No focal deficit present.      Mental Status: He is alert and oriented to person, place, and time.          ?   Medical Decision Making: Patient was triaged, vital signs were obtained, patient was placed in a room and orders were placed by day shift physician prior to my shift. I did examine the patient. After examining the patient, l did  review patient's EKG and initial workup.  Chest x-ray was negative for acute cardiopulmonary abnormality.  CBC was within normal limits.  CMP was within normal limits except for glucose of 124. Initial troponin is normal with total CK of 169. I discussed results with patient and recommended repeat troponin and EKG in about 3 hours or possible hospital observation stay for further workup.  Patient preferred a repeat labs in discharge home if possible.  Patient's pain had resolved and he was resting comfortably on the bed.  Repeat EKG was negative for acute abnormality.  Troponin was normal and total CK had improved to 141. I recommended that patient follow-up with his primary care provider and cardiologist.  He verbalized understanding.  Patient felt comfortable with discharge home with return for any worsening or concerning symptoms.   ?   Clinical Impression:   Encounter Diagnosis   Name Primary?   . Chest pain Yes      ?   Disposition: Discharged    Jonathon Jordan Malone-Prioleau, DO           Procedures

## 2019-10-26 NOTE — ED Nurses Note (Signed)
Patient discharged home with family.  AVS reviewed with patient/care giver.  A written copy of the AVS and discharge instructions was given to the patient/care giver.  Questions sufficiently answered as needed.  Patient/care giver encouraged to follow up with PCP as indicated.  In the event of an emergency, patient/care giver instructed to call 911 or go to the nearest emergency room.

## 2019-10-27 LAB — LAVENDER TOP TUBE

## 2019-10-27 LAB — RED TOP TUBE

## 2019-10-27 LAB — GOLD TOP TUBE

## 2019-10-29 LAB — ECG 12 LEAD
Atrial Rate: 72 {beats}/min
Atrial Rate: 53 {beats}/min
Calculated P Axis: 40 degrees
Calculated P Axis: 31 degrees
Calculated R Axis: -22 degrees
Calculated R Axis: -13 degrees
Calculated T Axis: 24 degrees
PR Interval: 160 ms
PR Interval: 160 ms
QRS Duration: 98 ms
QRS Duration: 96 ms
QT Interval: 392 ms
QT Interval: 454 ms
QTC Calculation: 429 ms
QTC Calculation: 426 ms
Ventricular rate: 72 {beats}/min
Ventricular rate: 53 {beats}/min

## 2019-11-16 ENCOUNTER — Other Ambulatory Visit (INDEPENDENT_AMBULATORY_CARE_PROVIDER_SITE_OTHER): Payer: Self-pay | Admitting: Family Medicine

## 2019-11-16 DIAGNOSIS — I10 Essential (primary) hypertension: Secondary | ICD-10-CM

## 2019-11-16 DIAGNOSIS — E1100 Type 2 diabetes mellitus with hyperosmolarity without nonketotic hyperglycemic-hyperosmolar coma (NKHHC): Secondary | ICD-10-CM

## 2020-01-10 DIAGNOSIS — I10 Essential (primary) hypertension: Secondary | ICD-10-CM | POA: Diagnosis not present

## 2020-01-10 DIAGNOSIS — Z125 Encounter for screening for malignant neoplasm of prostate: Secondary | ICD-10-CM | POA: Diagnosis not present

## 2020-01-10 DIAGNOSIS — E7800 Pure hypercholesterolemia, unspecified: Secondary | ICD-10-CM | POA: Diagnosis not present

## 2020-01-10 DIAGNOSIS — E1169 Type 2 diabetes mellitus with other specified complication: Secondary | ICD-10-CM | POA: Diagnosis not present

## 2020-01-10 DIAGNOSIS — M2550 Pain in unspecified joint: Secondary | ICD-10-CM | POA: Diagnosis not present

## 2020-02-16 ENCOUNTER — Encounter (INDEPENDENT_AMBULATORY_CARE_PROVIDER_SITE_OTHER): Payer: Self-pay | Admitting: Family Medicine

## 2020-02-22 ENCOUNTER — Other Ambulatory Visit (INDEPENDENT_AMBULATORY_CARE_PROVIDER_SITE_OTHER): Payer: Self-pay | Admitting: Family Medicine

## 2020-02-22 DIAGNOSIS — E7800 Pure hypercholesterolemia, unspecified: Secondary | ICD-10-CM

## 2020-02-26 ENCOUNTER — Other Ambulatory Visit (HOSPITAL_BASED_OUTPATIENT_CLINIC_OR_DEPARTMENT_OTHER): Payer: Self-pay | Admitting: Surgical

## 2020-02-26 NOTE — Telephone Encounter (Signed)
LV 09/01/2019  NV not scheduled  LL 10/26/2019  Georgette Shell, [place]  02/26/2020, 13:54

## 2020-02-27 MED ORDER — HUMIRA(CF) PEN 40 MG/0.4 ML SUBCUTANEOUS KIT
40.00 mg | PEN_INJECTOR | SUBCUTANEOUS | 1 refills | Status: DC
Start: 2020-02-27 — End: 2020-03-04

## 2020-03-04 ENCOUNTER — Other Ambulatory Visit (INDEPENDENT_AMBULATORY_CARE_PROVIDER_SITE_OTHER): Payer: Self-pay | Admitting: Pharmacist

## 2020-03-04 ENCOUNTER — Other Ambulatory Visit (HOSPITAL_BASED_OUTPATIENT_CLINIC_OR_DEPARTMENT_OTHER): Payer: Self-pay | Admitting: Surgical

## 2020-03-04 DIAGNOSIS — L4050 Arthropathic psoriasis, unspecified: Secondary | ICD-10-CM

## 2020-03-04 MED ORDER — HUMIRA(CF) PEN 40 MG/0.4 ML SUBCUTANEOUS KIT
40.00 mg | PEN_INJECTOR | SUBCUTANEOUS | 1 refills | Status: DC
Start: 2020-03-04 — End: 2020-03-07

## 2020-03-04 NOTE — Telephone Encounter (Signed)
-----  Message from [place] sent at 03/04/2020 12:09 PM EDT -----  Provider's Name: Dessie Coma, PA    Prior authorization needed for following rx. Was started in cover my meds. Please call to advise.    Name of Medication: adalimumab (HUMIRA,CF, PEN) 40 mg/0.4 mL Subcutaneous Pen Injector Kit    Preferred [place], [place] [address]    Phone: [phone] Fax: [phone]    Hours: Not open 24 hours

## 2020-03-04 NOTE — Telephone Encounter (Addendum)
Prior authorization required for prescription Humira received by [facility] on 03/04/2020.  Benefits investigation to be completed.    Completed Re-Auth for Humira with Rep Larkin Ina on the phone. Reference number is [id number]    Prior Authorization for medication Humira has been approved by payor until 03/06/21 Approval notice has been scanned into Epic and can be found in the Media tab.    Staff message sent to [facility] noting the completion of prior authorization.  [facility] Clinical Technician requested that prescription be sent to [place] (patient preference)    If you have any questions, don't hesitate to contact the [place], Pharmacy Technician  03/07/2020, 10:13

## 2020-03-07 MED ORDER — HUMIRA(CF) PEN 40 MG/0.4 ML SUBCUTANEOUS KIT
40.00 mg | PEN_INJECTOR | SUBCUTANEOUS | 1 refills | Status: DC
Start: 2020-03-07 — End: 2021-02-05

## 2020-03-07 NOTE — Addendum Note (Signed)
Addended by: Thornell Sartorius on: 03/07/2020 11:29 AM     Modules accepted: Orders

## 2020-03-07 NOTE — Telephone Encounter (Signed)
Received notice of approval for Humira from [facility] staff, faxed approval letter/note to [facility] at F: [phone], pended script there as well. Thornell Sartorius, RN  03/07/2020, 11:28

## 2020-03-12 ENCOUNTER — Telehealth (HOSPITAL_BASED_OUTPATIENT_CLINIC_OR_DEPARTMENT_OTHER): Payer: Self-pay | Admitting: Surgical

## 2020-03-12 NOTE — Telephone Encounter (Signed)
Received Humira approval 01/08/20-03/06/21.  Donnetta Simpers, MA  03/12/2020, 09:43

## 2020-04-05 ENCOUNTER — Encounter (INDEPENDENT_AMBULATORY_CARE_PROVIDER_SITE_OTHER): Payer: BC Managed Care – PPO | Admitting: Family Medicine

## 2020-04-05 DIAGNOSIS — K219 Gastro-esophageal reflux disease without esophagitis: Secondary | ICD-10-CM

## 2020-04-05 DIAGNOSIS — E1100 Type 2 diabetes mellitus with hyperosmolarity without nonketotic hyperglycemic-hyperosmolar coma (NKHHC): Secondary | ICD-10-CM

## 2020-04-05 DIAGNOSIS — E7800 Pure hypercholesterolemia, unspecified: Secondary | ICD-10-CM

## 2020-04-05 DIAGNOSIS — I10 Essential (primary) hypertension: Secondary | ICD-10-CM

## 2020-04-05 DIAGNOSIS — Z1211 Encounter for screening for malignant neoplasm of colon: Secondary | ICD-10-CM

## 2020-04-05 DIAGNOSIS — L4050 Arthropathic psoriasis, unspecified: Secondary | ICD-10-CM

## 2020-04-08 DIAGNOSIS — M1711 Unilateral primary osteoarthritis, right knee: Secondary | ICD-10-CM | POA: Diagnosis not present

## 2020-05-10 ENCOUNTER — Encounter (INDEPENDENT_AMBULATORY_CARE_PROVIDER_SITE_OTHER): Payer: BC Managed Care – PPO | Admitting: Family Medicine

## 2020-05-14 ENCOUNTER — Other Ambulatory Visit (INDEPENDENT_AMBULATORY_CARE_PROVIDER_SITE_OTHER): Payer: Self-pay | Admitting: Family Medicine

## 2020-05-14 DIAGNOSIS — I10 Essential (primary) hypertension: Secondary | ICD-10-CM

## 2020-06-22 ENCOUNTER — Other Ambulatory Visit (HOSPITAL_COMMUNITY): Payer: Self-pay

## 2020-06-22 LAB — EXTERNAL COVID-19 MOLECULAR RESULT: External 2019-n-CoV/SARS-CoV-2: POSITIVE — AB

## 2020-06-25 ENCOUNTER — Other Ambulatory Visit (INDEPENDENT_AMBULATORY_CARE_PROVIDER_SITE_OTHER): Payer: Self-pay | Admitting: Family Medicine

## 2020-06-25 ENCOUNTER — Other Ambulatory Visit (HOSPITAL_COMMUNITY): Payer: Self-pay

## 2020-06-25 ENCOUNTER — Telehealth (INDEPENDENT_AMBULATORY_CARE_PROVIDER_SITE_OTHER): Payer: Self-pay | Admitting: Family Medicine

## 2020-06-25 LAB — EXTERNAL COVID-19 MOLECULAR RESULT: External 2019-n-CoV/SARS-CoV-2: POSITIVE — AB

## 2020-06-25 MED ORDER — AZITHROMYCIN 250 MG TABLET
ORAL_TABLET | ORAL | 0 refills | Status: DC
Start: 2020-06-25 — End: 2020-07-19

## 2020-06-25 MED ORDER — METHYLPREDNISOLONE 4 MG TABLETS IN A DOSE PACK
ORAL_TABLET | ORAL | 0 refills | Status: DC
Start: 2020-06-25 — End: 2020-07-19

## 2020-06-25 NOTE — Telephone Encounter (Signed)
Patient diagnosed with COVID today. He is symptomatic with loss of taste and smell and nasal congestion. His coworkers have the same symptoms and diagnosed today as well. He said they were prescribed Medrol dose pack and Zithromax and he feels he needs this as well. Please advise. Ventura Bruns, LPN  0/22/8406, 98:61

## 2020-06-25 NOTE — Telephone Encounter (Signed)
Patient notified. Ventura Bruns, LPN  7/54/2370, 23:01

## 2020-06-25 NOTE — Telephone Encounter (Signed)
I sent in prescriptions for him for the Zithromax and Medrol Dosepak.  Make sure he understands that the steroid pack likely will make his blood sugars go up.  He needs keep a close eye on those.

## 2020-07-01 DIAGNOSIS — M1711 Unilateral primary osteoarthritis, right knee: Secondary | ICD-10-CM | POA: Diagnosis not present

## 2020-07-01 DIAGNOSIS — Z6841 Body Mass Index (BMI) 40.0 and over, adult: Secondary | ICD-10-CM | POA: Diagnosis not present

## 2020-07-19 ENCOUNTER — Ambulatory Visit (INDEPENDENT_AMBULATORY_CARE_PROVIDER_SITE_OTHER): Payer: BC Managed Care – PPO

## 2020-07-19 ENCOUNTER — Other Ambulatory Visit: Payer: BC Managed Care – PPO | Attending: Family Medicine | Admitting: Family Medicine

## 2020-07-19 ENCOUNTER — Ambulatory Visit (INDEPENDENT_AMBULATORY_CARE_PROVIDER_SITE_OTHER): Payer: BC Managed Care – PPO | Admitting: Family Medicine

## 2020-07-19 ENCOUNTER — Encounter (INDEPENDENT_AMBULATORY_CARE_PROVIDER_SITE_OTHER): Payer: Self-pay | Admitting: Family Medicine

## 2020-07-19 ENCOUNTER — Other Ambulatory Visit: Payer: Self-pay

## 2020-07-19 VITALS — BP 120/68 | HR 79 | Temp 97.8°F | Resp 16 | Ht 69.0 in | Wt 209.0 lb

## 2020-07-19 DIAGNOSIS — I2510 Atherosclerotic heart disease of native coronary artery without angina pectoris: Secondary | ICD-10-CM

## 2020-07-19 DIAGNOSIS — I10 Essential (primary) hypertension: Secondary | ICD-10-CM

## 2020-07-19 DIAGNOSIS — E7800 Pure hypercholesterolemia, unspecified: Secondary | ICD-10-CM

## 2020-07-19 DIAGNOSIS — Z125 Encounter for screening for malignant neoplasm of prostate: Secondary | ICD-10-CM

## 2020-07-19 DIAGNOSIS — Z1211 Encounter for screening for malignant neoplasm of colon: Secondary | ICD-10-CM

## 2020-07-19 DIAGNOSIS — E1100 Type 2 diabetes mellitus with hyperosmolarity without nonketotic hyperglycemic-hyperosmolar coma (NKHHC): Secondary | ICD-10-CM

## 2020-07-19 DIAGNOSIS — K219 Gastro-esophageal reflux disease without esophagitis: Secondary | ICD-10-CM

## 2020-07-19 DIAGNOSIS — D126 Benign neoplasm of colon, unspecified: Secondary | ICD-10-CM

## 2020-07-19 LAB — COMPREHENSIVE METABOLIC PNL, FASTING
ALBUMIN: 4.4 g/dL (ref 3.2–4.6)
ALKALINE PHOSPHATASE: 51 U/L (ref 20–130)
ALT (SGPT): 30 U/L (ref <=52)
ANION GAP: 8 mmol/L
AST (SGOT): 19 U/L (ref <=35)
BILIRUBIN TOTAL: 1 mg/dL (ref 0.3–1.2)
BUN/CREA RATIO: 14
BUN: 15 mg/dL (ref 10–25)
CALCIUM: 9.7 mg/dL (ref 8.8–10.3)
CHLORIDE: 103 mmol/L (ref 98–111)
CO2 TOTAL: 26 mmol/L (ref 21–35)
CREATININE: 1.05 mg/dL (ref <=1.30)
ESTIMATED GFR: >60 mL/min/{1.73_m2}
GLUCOSE: 131 mg/dL — ABNORMAL HIGH (ref 70–110)
POTASSIUM: 4.4 mmol/L (ref 3.5–5.0)
PROTEIN TOTAL: 7.3 g/dL (ref 6.0–8.3)
SODIUM: 137 mmol/L (ref 135–145)

## 2020-07-19 LAB — LIPID PANEL
CHOLESTEROL: 113 mg/dL (ref 0–199)
HDL CHOL: 49 mg/dL (ref >40)
LDL DIRECT: 51 mg/dL (ref 0–99)
TRIGLYCERIDES: 137 mg/dL (ref 0–199)
VLDL CALC: 27 mg/dL (ref 0–50)

## 2020-07-19 LAB — POCT HGB A1C: POCT HGB A1C: 6.9 % — AB (ref 4–6)

## 2020-07-19 LAB — MICROALBUMIN/CREATININE RATIO, URINE, RANDOM
CREATININE RANDOM URINE: 93 mg/dL
MICROALBUMIN RANDOM URINE: 0.7 mg/dL (ref 0.0–1.9)
MICROALBUMIN/CREATININE RATIO RANDOM URINE: 7.5 mg/g (ref 0.0–30.0)

## 2020-07-19 NOTE — Progress Notes (Signed)
[place]  [address] [place] [address]  Dept Phone: [phone]  Dept Fax: [facility]  [birth year]  [id number]    Date of Service: 07/19/2020  9:45 AM EDT    Chief complaint:   Chief Complaint   Patient presents with   . Follow Up 6 Months     Patient is fasting. No complaints.       The patient is here for follow up of their chronic conditions.  He has been doing pretty well.  He was diagnosed with COVID-19 about 3 and weeks ago.  He is fully recovered without any sequela.    Subjective:     This is a case of a 54 y.o. year old male who comes in today for a routine follow up .         The patient's chronic conditions are stable and unchanged . See Problem List for chronic conditions, which have been reviewed today.       Current Outpatient Medications   Medication Sig   . adalimumab (HUMIRA,CF, PEN) 40 mg/0.4 mL Subcutaneous Pen Injector Kit 40 mg by Subcutaneous route Every 14 days   . amLODIPine (NORVASC) 10 mg Oral Tablet Take 1 Tablet (10 mg total) by mouth Once a day   . Blood Sugar Diagnostic (ONETOUCH VERIO) Strip 1 Strip by In Vitro route Three times a day   . Blood-Glucose Meter (ONETOUCH VERIO IQ METER) Misc 1 Each by In Vitro route Three times a day   . enalapril (VASOTEC) 20 mg Oral Tablet Take 1 Tab (20 mg total) by mouth Once a day   . glipiZIDE (GLUCOTROL XL) 10 mg Oral Tablet Extended Rel 24 hr (2) Take 1 Tab (10 mg total) by mouth Every morning with breakfast   . lancets (ONETOUCH SURESOFT LANCING DEV) 28 gauge Misc 1 Each by In Vitro route Three times a day   . metoprolol succinate (TOPROL-XL) 50 mg Oral Tablet Sustained Release 24 hr Take 1 Tab (50 mg total) by mouth Once a day   . rosuvastatin (CRESTOR) 20 mg Oral Tablet Take 1 Tablet (20 mg total) by mouth Every evening     Patient Active Problem List   Diagnosis   . Essential hypertension   . Gastroesophageal reflux disease without esophagitis   . Urinary calculus, unspecified   .  Psoriatic arthritis (CMS [place])   . Fracture of distal end of tibia   . Fracture of distal fibula   . Coronary artery disease involving native coronary artery without angina pectoris   . Diabetes mellitus, type 2 (CMS HCC)   . Back pain   . Pure hypercholesterolemia   . Worsening headaches   . Screening PSA (prostate specific antigen)   . Screen for colon cancer   . History of kidney stones   . Right shoulder pain   . Tubular adenoma of colon     Past Surgical History:   Procedure Laterality Date   . HX APPENDECTOMY      2017   . HX CARPAL TUNNEL RELEASE  1990    bil   . HX HEART CATHETERIZATION  11/09   . [place]    2010   . LITHOTRIPSY  2004   . PB LAP UMBILICAL HERNIA REPAIR  2009         Family Medical History:     Problem Relation (Age of Onset)    Cancer  Father    Diabetes Mother, Father    Hypertension (High Blood Pressure) Mother            Social History     Socioeconomic History   . Marital status: Married     Spouse name: Not on file   . Number of children: Not on file   . Years of education: Not on file   . Highest education level: Not on file   Tobacco Use   . Smoking status: Former Smoker     Types: Cigars   . Smokeless tobacco: Former Systems developer     Types: Snuff     Quit date: 10/26/2019   . Tobacco comment: 3-4 a week   Substance and Sexual Activity   . Alcohol use: Yes     Comment: occassionally   . Drug use: No   Other Topics Concern     Social Determinants of Health     Financial Resource Strain:    . Difficulty of Paying Living Expenses:    Food Insecurity:    . Worried About Charity fundraiser in the Last Year:    . Arboriculturist in the Last Year:    Transportation Needs:    . Film/video editor (Medical):    Marland Kitchen Lack of Transportation (Non-Medical):    Physical Activity:    . Days of Exercise per Week:    . Minutes of Exercise per Session:    Stress:    . Feeling of Stress :    Intimate Partner Violence:    . Fear of Current or Ex-Partner:    . Emotionally Abused:    Marland Kitchen Physically  Abused:    . Sexually Abused:      Past Surgical History:   Procedure Laterality Date   . COLONOSCOPY N/A 10/23/2016    Performed by Melida Quitter, MD at [facility]   . HX APPENDECTOMY      2017   . HX CARPAL TUNNEL RELEASE  1990    bil   . HX HEART CATHETERIZATION  11/09   . [place]    2010   . LITHOTRIPSY  2004   . PB LAP UMBILICAL HERNIA REPAIR  2009         REVIEW OF SYSTEMS:  General: (-) fevers (-) chills. (-) weight loss. (-) fatigue.  Lymphatic: (-) palpable masses. (-) night sweats.  Heme: (-) easy bruising (-) bleeding. (-) recurrent infections.   HEENT. (-) vision changes (-) hearing changes. (-) dysphagia. (-) sore throat.   Heart: (-) chest pain. (-) palpitation. (-) orthopnea. (-) LE edema.   Lungs: (-) dyspnea (on exertion) (-) hemoptysis. (-) cough.   Abdomen: (-) poor appetite. (-) abdominal pain. (-) nausea (-) vomiting. (-) diarrhea. (-) constipation.   GU: (-) dysuria (-) Urgency. (-) Hematuria.   MS. (-) joint pain (-) ext swelling. (-) Back pain.   Dermatologic: (-) rashes. (-) pruritus.   Psychiatric: (-) Depression. (-) anxiety. (-) insomnia.   Neurologic: (-) headaches. (-) neuropathy. (-) weakness. (-) memory problems.              Objective:     BP 120/68   Pulse 79   Temp 36.6 C (97.8 F)   Resp 16   Ht 1.753 m (5' 9" )   Wt 94.8 kg (209 lb)   SpO2 98%   BMI 30.86 kg/m         General appearance: alert, oriented  x 3, in his normal state, cooperative, not in apparent distress, appearing stated age   Integumentary:   Overall examination of the patient's skin reveals- no rashes, no suspicious lesions, and no bruises. Normal coloration of skin. Normal skin moisture.  Head and Neck:  Normocephalic and atraumatic. No lesions or palpable masses. Neck is supple with full ROM. No bruit auscultated on the left or right. No nucchal rigidity. No lymphadenopathy. Thyroid:  Normal size and consistency, no palpable nodules, normal position and symmetric. Non tender.   Lungs:  Respirations are non labored. Patient has no conversational dyspnea. Lungs are clear to ausculation .    Heart: regular rate and rhythm, S1, S2 normal, no murmur  Abdomen: soft, non-tender. Bowel sounds normal. No abnormal pulsations. No rigidity and no hepatosplenomegaly . Normal active bowel sounds in all quadrants.   Extremities: extremities normal, atraumatic, no cyanosis or edema, pulses intact in upper and lower extremities.  No varicose ulcers.      Psych :  Patient is alert and oriented and cooperative with the exam. No evidence of hallucinations, delusions or homicidal/suicidal ideations.   Neuro:  Cranial Nerves 2-12 are grossly intact . Coordination is normal . Gait is normal.  No meningeal signs.     Assessment :       ICD-10-CM    1. Type 2 diabetes mellitus with hyperosmolarity without coma, without long-term current use of insulin (CMS HCC)  E11.00 POCT HGB A1C    a1c 7.0% 08-18-19   2. Coronary artery disease involving native coronary artery without angina pectoris  I25.10    3. Essential hypertension  I10    4. Pure hypercholesterolemia  E78.00    5. Gastroesophageal reflux disease without esophagitis  K21.9    6. Tubular adenoma of colon  D12.6    7. Screen for colon cancer  Z12.11     11-02-16   8. Screening PSA (prostate specific antigen)  Z12.5     11-25-18 result 0.3         PLAN :   Diabetes Monitors  A1C: 6.9  A1C Date: 07/19/2020           Nephropathy Screening: On ACEI or ARB    Last Lipid Panel  (Last result in the past 2 years)      Cholesterol   HDL   LDL   Direct LDL   Triglycerides        08/18/19 1023 104 46   43 126         Retinal Exam Date: 05/27/2019  Last diabetic foot exam: Not Found      He is doing very well in regards to his diabetes.  Will continue current medications.  He does not report any significant hypoglycemia.  He has lost some weight recently.  I congratulated him on that.    Continue current treatment regimen. Patient is doing well. Patient is to call with any  problems prior to next apppointment if needed.    Will draw fasting labs today.     He is up-to-date on screening colonoscopies.    His blood pressure is well controlled will continue current medications.    He has a history of coronary artery disease.  He has not had any recent cardiac events.  Will continue his medications.    He follows with Urology in the check his PSA test.                No follow-ups on file.  PHQ Questionnaire         The patient was given ample opportunity to ask questions and those questions were answered to the patient's satisfaction. The patient was encouraged to be involved in their own care, and all diagnoses, medications, and medication side-effects were discussed.  A copy of the patient's medication list was printed and given to the patient. A good faith effort was made to reconcile the patient's medications.  The patient is aware that they are to contact me with any additional questions or concerns, or go to the ED in an emergency.     Future Appointments   Date Time Provider [place]   07/19/2020  9:45 AM Elease Hashimoto, DO [facility] [place]   08/23/2020 10:00 AM Genia Harold, PA-C [place] Maintenance Due   Topic Date Due   . Covid-19 Vaccine (1 of 2) Never done   . HIV Screening  Never done   . Adult Tdap-Td (1 - Tdap) Never done   . Shingles Vaccine (1 of 2) Never done   . Depression Screening  11/26/2019   . Hemoglobin A1C for Diabetes  02/16/2020   . Influenza Vaccine (1) 07/17/2020     Health Maintenance   Topic Date Due   . Covid-19 Vaccine (1 of 2) Never done   . HIV Screening  Never done   . Adult Tdap-Td (1 - Tdap) Never done   . Shingles Vaccine (1 of 2) Never done   . Depression Screening  11/26/2019   . Hemoglobin A1C for Diabetes  02/16/2020   . Influenza Vaccine (1) 07/17/2020   . Colonoscopy  10/23/2021   . Pneumococcal 19-64 Years Medium Risk  Completed                 This note was partially generated using MModal Fluency  Direct system, and there may be some incorrect words, spellings, and punctuation that were not noted in checking the note before saving.    Dwyane Luo, DO    [facility]  [address] [place] [address]  Dept Phone: [phone]  Dept Fax: [phone]

## 2020-07-23 ENCOUNTER — Encounter (INDEPENDENT_AMBULATORY_CARE_PROVIDER_SITE_OTHER): Payer: Self-pay | Admitting: Family Medicine

## 2020-08-01 ENCOUNTER — Other Ambulatory Visit: Payer: Self-pay

## 2020-08-01 ENCOUNTER — Ambulatory Visit (INDEPENDENT_AMBULATORY_CARE_PROVIDER_SITE_OTHER): Payer: Self-pay

## 2020-08-01 DIAGNOSIS — Z021 Encounter for pre-employment examination: Secondary | ICD-10-CM

## 2020-08-02 DIAGNOSIS — I10 Essential (primary) hypertension: Secondary | ICD-10-CM | POA: Diagnosis not present

## 2020-08-02 DIAGNOSIS — E7800 Pure hypercholesterolemia, unspecified: Secondary | ICD-10-CM | POA: Diagnosis not present

## 2020-08-02 DIAGNOSIS — E1169 Type 2 diabetes mellitus with other specified complication: Secondary | ICD-10-CM | POA: Diagnosis not present

## 2020-08-05 DIAGNOSIS — M1711 Unilateral primary osteoarthritis, right knee: Secondary | ICD-10-CM | POA: Diagnosis not present

## 2020-08-12 DIAGNOSIS — M1711 Unilateral primary osteoarthritis, right knee: Secondary | ICD-10-CM | POA: Diagnosis not present

## 2020-08-19 DIAGNOSIS — M1711 Unilateral primary osteoarthritis, right knee: Secondary | ICD-10-CM | POA: Diagnosis not present

## 2020-08-23 ENCOUNTER — Encounter (HOSPITAL_BASED_OUTPATIENT_CLINIC_OR_DEPARTMENT_OTHER): Payer: Self-pay | Admitting: Medical

## 2020-09-30 ENCOUNTER — Encounter (HOSPITAL_BASED_OUTPATIENT_CLINIC_OR_DEPARTMENT_OTHER): Payer: Self-pay | Admitting: Medical

## 2020-10-04 ENCOUNTER — Encounter (HOSPITAL_BASED_OUTPATIENT_CLINIC_OR_DEPARTMENT_OTHER): Payer: Self-pay | Admitting: Medical

## 2020-10-18 ENCOUNTER — Encounter (INDEPENDENT_AMBULATORY_CARE_PROVIDER_SITE_OTHER): Payer: Self-pay | Admitting: Family Medicine

## 2020-10-18 ENCOUNTER — Encounter (HOSPITAL_BASED_OUTPATIENT_CLINIC_OR_DEPARTMENT_OTHER): Payer: Self-pay | Admitting: Medical

## 2020-11-27 ENCOUNTER — Other Ambulatory Visit (INDEPENDENT_AMBULATORY_CARE_PROVIDER_SITE_OTHER): Payer: Self-pay | Admitting: Family Medicine

## 2020-11-27 DIAGNOSIS — I10 Essential (primary) hypertension: Secondary | ICD-10-CM

## 2020-11-27 DIAGNOSIS — E1100 Type 2 diabetes mellitus with hyperosmolarity without nonketotic hyperglycemic-hyperosmolar coma (NKHHC): Secondary | ICD-10-CM

## 2020-12-04 ENCOUNTER — Other Ambulatory Visit: Payer: Self-pay

## 2020-12-05 ENCOUNTER — Telehealth (HOSPITAL_BASED_OUTPATIENT_CLINIC_OR_DEPARTMENT_OTHER): Payer: Self-pay | Admitting: Medical

## 2020-12-05 DIAGNOSIS — N400 Enlarged prostate without lower urinary tract symptoms: Secondary | ICD-10-CM

## 2020-12-05 DIAGNOSIS — R3915 Urgency of urination: Secondary | ICD-10-CM

## 2020-12-05 DIAGNOSIS — R350 Frequency of micturition: Secondary | ICD-10-CM

## 2020-12-05 DIAGNOSIS — Z87442 Personal history of urinary calculi: Secondary | ICD-10-CM

## 2020-12-06 ENCOUNTER — Ambulatory Visit: Payer: BC Managed Care – PPO | Attending: Medical | Admitting: Medical

## 2020-12-06 ENCOUNTER — Encounter (HOSPITAL_BASED_OUTPATIENT_CLINIC_OR_DEPARTMENT_OTHER): Payer: Self-pay | Admitting: Medical

## 2020-12-06 ENCOUNTER — Ambulatory Visit (INDEPENDENT_AMBULATORY_CARE_PROVIDER_SITE_OTHER): Payer: BC Managed Care – PPO

## 2020-12-06 ENCOUNTER — Ambulatory Visit (INDEPENDENT_AMBULATORY_CARE_PROVIDER_SITE_OTHER): Payer: BC Managed Care – PPO | Admitting: Family Medicine

## 2020-12-06 ENCOUNTER — Other Ambulatory Visit (HOSPITAL_COMMUNITY): Payer: BC Managed Care – PPO | Admitting: Family Medicine

## 2020-12-06 ENCOUNTER — Encounter (INDEPENDENT_AMBULATORY_CARE_PROVIDER_SITE_OTHER): Payer: Self-pay | Admitting: Family Medicine

## 2020-12-06 ENCOUNTER — Ambulatory Visit (HOSPITAL_COMMUNITY)
Admission: RE | Admit: 2020-12-06 | Discharge: 2020-12-06 | Disposition: A | Discharge status: Home / Self Care | Payer: BC Managed Care – PPO | Source: Ambulatory Visit | Attending: Medical | Admitting: Medical

## 2020-12-06 ENCOUNTER — Other Ambulatory Visit: Payer: Self-pay

## 2020-12-06 VITALS — BP 114/68 | HR 73 | Temp 98.7°F | Resp 18 | Ht 69.0 in | Wt 214.6 lb

## 2020-12-06 DIAGNOSIS — R351 Nocturia: Secondary | ICD-10-CM

## 2020-12-06 DIAGNOSIS — I10 Essential (primary) hypertension: Secondary | ICD-10-CM | POA: Insufficient documentation

## 2020-12-06 DIAGNOSIS — E7800 Pure hypercholesterolemia, unspecified: Secondary | ICD-10-CM

## 2020-12-06 DIAGNOSIS — I2510 Atherosclerotic heart disease of native coronary artery without angina pectoris: Secondary | ICD-10-CM

## 2020-12-06 DIAGNOSIS — Z87442 Personal history of urinary calculi: Secondary | ICD-10-CM | POA: Insufficient documentation

## 2020-12-06 DIAGNOSIS — K219 Gastro-esophageal reflux disease without esophagitis: Secondary | ICD-10-CM

## 2020-12-06 DIAGNOSIS — N400 Enlarged prostate without lower urinary tract symptoms: Secondary | ICD-10-CM | POA: Insufficient documentation

## 2020-12-06 DIAGNOSIS — L4050 Arthropathic psoriasis, unspecified: Secondary | ICD-10-CM

## 2020-12-06 DIAGNOSIS — Z125 Encounter for screening for malignant neoplasm of prostate: Secondary | ICD-10-CM

## 2020-12-06 DIAGNOSIS — Z1211 Encounter for screening for malignant neoplasm of colon: Secondary | ICD-10-CM

## 2020-12-06 DIAGNOSIS — D126 Benign neoplasm of colon, unspecified: Secondary | ICD-10-CM

## 2020-12-06 DIAGNOSIS — R3915 Urgency of urination: Secondary | ICD-10-CM | POA: Insufficient documentation

## 2020-12-06 DIAGNOSIS — E1100 Type 2 diabetes mellitus with hyperosmolarity without nonketotic hyperglycemic-hyperosmolar coma (NKHHC): Secondary | ICD-10-CM

## 2020-12-06 DIAGNOSIS — R350 Frequency of micturition: Secondary | ICD-10-CM | POA: Insufficient documentation

## 2020-12-06 DIAGNOSIS — N401 Enlarged prostate with lower urinary tract symptoms: Secondary | ICD-10-CM

## 2020-12-06 LAB — URINALYSIS, MACROSCOPIC
BILIRUBIN: NEGATIVE mg/dL
BLOOD: NEGATIVE mg/dL
COLOR: NORMAL
GLUCOSE: 50 mg/dL — AB
KETONES: NEGATIVE mg/dL
LEUKOCYTES: NEGATIVE WBCs/uL
NITRITE: NEGATIVE
PH: 6.5 (ref 5.0–8.0)
PROTEIN: NEGATIVE mg/dL
SPECIFIC GRAVITY: 1.023 (ref 1.005–1.030)
UROBILINOGEN: NORMAL mg/dL

## 2020-12-06 LAB — COMPREHENSIVE METABOLIC PNL, FASTING
ALBUMIN: 4.5 g/dL (ref 3.2–4.6)
ALKALINE PHOSPHATASE: 51 U/L (ref 20–130)
ALT (SGPT): 26 U/L (ref <=52)
ANION GAP: 8 mmol/L
AST (SGOT): 17 U/L (ref <=35)
BILIRUBIN TOTAL: 0.8 mg/dL (ref 0.3–1.2)
BUN/CREA RATIO: 15
BUN: 16 mg/dL (ref 10–25)
CALCIUM: 9.8 mg/dL (ref 8.8–10.3)
CHLORIDE: 106 mmol/L (ref 98–111)
CO2 TOTAL: 25 mmol/L (ref 21–35)
CREATININE: 1.07 mg/dL (ref <=1.30)
ESTIMATED GFR: >60 mL/min/{1.73_m2}
GLUCOSE: 157 mg/dL — ABNORMAL HIGH (ref 70–110)
POTASSIUM: 4.9 mmol/L (ref 3.5–5.0)
PROTEIN TOTAL: 7.3 g/dL (ref 6.0–8.3)
SODIUM: 139 mmol/L (ref 135–145)

## 2020-12-06 LAB — LIPID PANEL
CHOLESTEROL: 113 mg/dL (ref 0–199)
HDL CHOL: 54 mg/dL (ref >40)
LDL DIRECT: 46 mg/dL (ref 0–99)
TRIGLYCERIDES: 164 mg/dL (ref 0–199)
VLDL CALC: 33 mg/dL (ref 0–50)

## 2020-12-06 LAB — URINALYSIS, MICROSCOPIC

## 2020-12-06 LAB — POCT PVR

## 2020-12-06 LAB — POCT HGB A1C: POCT HGB A1C: 7.6 % — AB (ref 4–6)

## 2020-12-06 LAB — PSA SCREENING: PSA: 0.2 ng/mL (ref 0.0–4.0)

## 2020-12-06 NOTE — Progress Notes (Signed)
[place]  [address] [place] [address]  Dept Phone: [phone]  Dept Fax: [place]  08-10-1966  [id number]    Date of Service: 12/06/2020  8:30 AM EST    Chief complaint:   Chief Complaint   Patient presents with   . Follow Up 3 Months     Pt is fasting for labs. Pt has no complaints   . Diabetes Follow up   . Hypertension   . Hypercholesterolemia, Pure       The patient is here for follow up of their chronic conditions.  He has been doing pretty well.  He denies any complaints.  He said he really has not been exercising recently.  He is starting going to start getting back into that.  He has not had any significantly low blood sugars.    Subjective:     This is a case of a 55 y.o. year old male who comes in today for a routine follow up .         The patient's chronic conditions are stable and unchanged . See Problem List for chronic conditions, which have been reviewed today.       Current Outpatient Medications   Medication Sig   . adalimumab (HUMIRA,CF, PEN) 40 mg/0.4 mL Subcutaneous Pen Injector Kit 40 mg by Subcutaneous route Every 14 days   . amLODIPine (NORVASC) 10 mg Oral Tablet Take 1 Tablet (10 mg total) by mouth Once a day   . Blood Sugar Diagnostic (ONETOUCH VERIO) Strip 1 Strip by In Vitro route Three times a day   . Blood-Glucose Meter (ONETOUCH VERIO IQ METER) Misc 1 Each by In Vitro route Three times a day   . enalapril (VASOTEC) 20 mg Oral Tablet Take 1 Tablet (20 mg total) by mouth Once a day   . glipiZIDE (GLUCOTROL XL) 10 mg Oral Tablet Extended Rel 24 hr Take 1 Tablet (10 mg total) by mouth Every morning with breakfast   . lancets (ONETOUCH SURESOFT LANCING DEV) 28 gauge Misc 1 Each by In Vitro route Three times a day   . metoprolol succinate (TOPROL-XL) 50 mg Oral Tablet Sustained Release 24 hr Take 1 Tablet (50 mg total) by mouth Once a day   . rosuvastatin (CRESTOR) 20 mg Oral Tablet Take 1 Tablet (20 mg total) by mouth Every  evening     Patient Active Problem List   Diagnosis   . Essential hypertension   . Gastroesophageal reflux disease without esophagitis   . Urinary calculus, unspecified   . Psoriatic arthritis (CMS [place])   . Fracture of distal end of tibia   . Fracture of distal fibula   . Coronary artery disease involving native coronary artery without angina pectoris   . Diabetes mellitus, type 2 (CMS HCC)   . Back pain   . Pure hypercholesterolemia   . Worsening headaches   . Screening PSA (prostate specific antigen)   . Screen for colon cancer   . History of kidney stones   . Right shoulder pain   . Tubular adenoma of colon     Past Surgical History:   Procedure Laterality Date   . HX APPENDECTOMY      2017   . HX CARPAL TUNNEL RELEASE  1990    bil   . HX HEART CATHETERIZATION  11/09   . [place]    2010   . LITHOTRIPSY  3086   . PB LAP UMBILICAL HERNIA REPAIR  2009         Family Medical History:     Problem Relation (Age of Onset)    Cancer Father    Diabetes Mother, Father    Hypertension (High Blood Pressure) Mother          Social History     Socioeconomic History   . Marital status: Married     Spouse name: Not on file   . Number of children: Not on file   . Years of education: Not on file   . Highest education level: Not on file   Tobacco Use   . Smoking status: Former Smoker     Types: Cigars   . Smokeless tobacco: Former Systems developer     Types: Snuff     Quit date: 10/26/2019   . Tobacco comment: 3-4 a week   Substance and Sexual Activity   . Alcohol use: Yes     Comment: occassionally   . Drug use: No   Other Topics Concern     Past Surgical History:   Procedure Laterality Date   . COLONOSCOPY N/A 10/23/2016    Performed by Melida Quitter, MD at [facility]   . HX APPENDECTOMY      2017   . HX CARPAL TUNNEL RELEASE  1990    bil   . HX HEART CATHETERIZATION  11/09   . [place]    2010   . LITHOTRIPSY  2004   . PB LAP UMBILICAL HERNIA REPAIR  2009         REVIEW OF SYSTEMS:  General: (-) fevers (-) chills.  (-) weight loss. (-) fatigue.  Lymphatic: (-) palpable masses. (-) night sweats.  Heme: (-) easy bruising (-) bleeding. (-) recurrent infections.   HEENT. (-) vision changes (-) hearing changes. (-) dysphagia. (-) sore throat.   Heart: (-) chest pain. (-) palpitation. (-) orthopnea. (-) LE edema.   Lungs: (-) dyspnea (on exertion) (-) hemoptysis. (-) cough.   Abdomen: (-) poor appetite. (-) abdominal pain. (-) nausea (-) vomiting. (-) diarrhea. (-) constipation.   GU: (-) dysuria (-) Urgency. (-) Hematuria.   MS. (-) joint pain (-) ext swelling. (-) Back pain.   Dermatologic: (-) rashes. (-) pruritus.   Psychiatric: (-) Depression. (-) anxiety. (-) insomnia.   Neurologic: (-) headaches. (-) neuropathy. (-) weakness. (-) memory problems.              Objective:     BP 114/68   Pulse 73   Temp 37.1 C (98.7 F) (Thermal Scan)   Resp 18   Ht 1.753 m (_0 )   Wt 97.3 kg (214 lb 9.6 oz)   SpO2 98%   BMI 31.69 kg/m         General appearance: alert, oriented x 3, in his normal state, cooperative, not in apparent distress, appearing stated age   Integumentary:   Overall examination of the patient's skin reveals- no rashes, no suspicious lesions, and no bruises. Normal coloration of skin. Normal skin moisture.  Head and Neck:  Normocephalic and atraumatic. No lesions or palpable masses. Neck is supple with full ROM. No bruit auscultated on the left or right. No nucchal rigidity. No lymphadenopathy. Thyroid:  Normal size and consistency, no palpable nodules, normal position and symmetric. Non tender.   Lungs: Respirations are non labored. Patient has no conversational dyspnea. Lungs are clear to ausculation .  Heart: regular rate and rhythm, S1, S2 normal, no murmur  Abdomen: soft, non-tender. Bowel sounds normal. No abnormal pulsations. No rigidity and no hepatosplenomegaly . Normal active bowel sounds in all quadrants.   Extremities: extremities normal, atraumatic, no cyanosis or edema, pulses intact in upper  and lower extremities.  No varicose ulcers.      Psych :  Patient is alert and oriented and cooperative with the exam. No evidence of hallucinations, delusions or homicidal/suicidal ideations.   Neuro:  Cranial Nerves 2-12 are grossly intact . Coordination is normal . Gait is normal.  No meningeal signs.     Assessment :       ICD-10-CM    1. Type 2 diabetes mellitus with hyperosmolarity without coma, without long-term current use of insulin (CMS HCC)  E11.00 POCT HGB A1C    a1c 6.9% 07-19-20   2. Coronary artery disease involving native coronary artery without angina pectoris  I25.10    3. Essential hypertension  I10 LIPID PANEL     COMPREHENSIVE METABOLIC PNL, FASTING   4. Pure hypercholesterolemia  E78.00 LIPID PANEL     COMPREHENSIVE METABOLIC PNL, FASTING   5. Gastroesophageal reflux disease without esophagitis  K21.9    6. Tubular adenoma of colon  D12.6    7. Psoriatic arthritis (CMS HCC)  L40.50    8. Screen for colon cancer  Z12.11     11-02-16   9. Screening PSA (prostate specific antigen)  Z12.5     11-25-18 result 0.3   10. Nocturia  R35.1 PSA SCREENING         PLAN :   Diabetes Monitors  A1C: 7.6  A1C Date: 12/06/2020           Nephropathy Screening: On ACEI or ARB    Last Lipid Panel  (Last result in the past 2 years)      Cholesterol   HDL   LDL   Direct LDL   Triglycerides        07/19/20 0951 113   49     51   137           Retinal Exam Date: 05/27/2019  Last diabetic foot exam: Not Found    His blood sugars are up a little bit.  Again he has not been exercising recently.  He said he is going to start getting back into that.  He usually does some weightlifting.  She will continue his current dose of glipizide.    Continue current treatment regimen. Patient is doing well. Patient is to call with any problems prior to next apppointment if needed.    Will draw fasting labs today.     His blood pressure is well controlled will continue current medications.    He is overdue for a screening PSA.  He says he  on occasion has had nocturia.  Will check a PSA on him today.    He is up-to-date on screening colonoscopies.  He will be due in December of this year.    Plan to see back in 3 months sooner              No follow-ups on file.           PHQ Questionnaire         The patient was given ample opportunity to ask questions and those questions were answered to the patient's satisfaction. The patient was encouraged to be involved in their own care, and all diagnoses, medications, and  medication side-effects were discussed.  A copy of the patient's medication list was printed and given to the patient. A good faith effort was made to reconcile the patient's medications.  The patient is aware that they are to contact me with any additional questions or concerns, or go to the ED in an emergency.     Future Appointments   Date Time Provider [place]   12/06/2020  9:45 AM Genia Harold, PA-C [place] Maintenance Due   Topic Date Due   . Covid-19 Vaccine (1) Never done   . HIV Screening  Never done   . Adult Tdap-Td (1 - Tdap) Never done   . Pneumococcal Vaccine, Age 18-64 based on risk (2 of 4 - PCV13) 10/31/2005   . Shingles Vaccine (1 of 2) Never done   . Influenza Vaccine (1) 07/17/2020     Health Maintenance   Topic Date Due   . Covid-19 Vaccine (1) Never done   . HIV Screening  Never done   . Adult Tdap-Td (1 - Tdap) Never done   . Pneumococcal Vaccine, Age 18-64 based on risk (2 of 4 - PCV13) 10/31/2005   . Shingles Vaccine (1 of 2) Never done   . Influenza Vaccine (1) 07/17/2020   . Diabetic Retinal Exam  05/26/2021   . Diabetes A1C  06/05/2021   . Depression Screening  07/19/2021   . Colonoscopy  10/23/2021                 This note was partially generated using MModal Fluency Direct system, and there may be some incorrect words, spellings, and punctuation that were not noted in checking the note before saving.    Dwyane Luo, DO    [facility]  [address]  [address] [place] [address]  Dept Phone: [phone]  Dept Fax: [phone]

## 2020-12-06 NOTE — H&P (Signed)
Chase [name] [id number]  Dept: [phone]  Dept Fax: [phone]      Follow up H&P    12/06/2020    Chase [name]  Date of Birth. 27-May-1966  MRN: [id number]  Referring Physician:  No ref. provider found      Chief Complaint   Patient presents with   . Benign Prostatic Hypertrophy       History of present illness:55 y.o. male that was last evaluated 08/24/2019.      55 y.o.malehere today for urinary frequency. Heis currently urinating every 2-3hours with a moderatestream, nocturia x 0-1, occurgency, he deniesdysuria, grosshematuria orurinary incontinence. The patient doesusually feel as though the bladder empties well. The post void residual today in office is 12cc.He denies any FH of PCa. He states he was recently told he had cysts on his left testicle. Upon examination , he has a small left spermatocele NTTP. Urine dip today is negative. Prior history of kidney stone requiring stents. He denies any recent imaging."    No issues with stones since his last office appointment. He states for the past 3-4 weeks he is having to wake up to void with some hesitancy. During the day he voids every few hours with a moderate stream. He denies dysuria or gross hematuria. Urine dip today is negative, PVR= 0 cc. PSA from 11/25/2018=0.3.  ----  Now taking Cialis 5 mg daily. Currently voiding during the day every 3-4 hours with a forceful stream, nocturia 0,  He denies urgency,  dysuria, gross hematuria or urinary incontinence. The post void residual today in office is 1 cc. Very pleased with voiding since starting the Cialis. Last PSA from 11/25/2018=0.3. PSA obtained today.       PMH:   Past Medical History:   Diagnosis Date   . Arthritis with psoriasis (CMS HCC)    . Arthropathy     psoriatic   . CAD (coronary artery disease)    . Diabetes mellitus, type 2 (CMS [place]) 03/19/2009   . Fracture of distal end of tibia     left   . Fracture of distal fibula     left   . GERD  (gastroesophageal reflux disease)    . Hypertension    . Kidney stones    . Right shoulder pain    . Type 2 diabetes mellitus (CMS HCC)    . Urinary calculus, unspecified     Renal stones         Past Surgical History:   Procedure Laterality Date   . HX APPENDECTOMY      2017   . HX CARPAL TUNNEL RELEASE  1990    bil   . HX HEART CATHETERIZATION  11/09   . [place]    2010   . LITHOTRIPSY  2004   . PB LAP UMBILICAL HERNIA REPAIR  2009         Current Outpatient Medications   Medication Sig   . adalimumab (HUMIRA,CF, PEN) 40 mg/0.4 mL Subcutaneous Pen Injector Kit 40 mg by Subcutaneous route Every 14 days   . amLODIPine (NORVASC) 10 mg Oral Tablet Take 1 Tablet (10 mg total) by mouth Once a day   . Blood Sugar Diagnostic (ONETOUCH VERIO) Strip 1 Strip by In Vitro route Three times a day   . Blood-Glucose Meter (ONETOUCH VERIO IQ METER) Misc 1 Each by In Vitro route Three times a day   . enalapril (VASOTEC) 20  mg Oral Tablet Take 1 Tablet (20 mg total) by mouth Once a day   . glipiZIDE (GLUCOTROL XL) 10 mg Oral Tablet Extended Rel 24 hr Take 1 Tablet (10 mg total) by mouth Every morning with breakfast   . lancets (ONETOUCH SURESOFT LANCING DEV) 28 gauge Misc 1 Each by In Vitro route Three times a day   . metoprolol succinate (TOPROL-XL) 50 mg Oral Tablet Sustained Release 24 hr Take 1 Tablet (50 mg total) by mouth Once a day   . rosuvastatin (CRESTOR) 20 mg Oral Tablet Take 1 Tablet (20 mg total) by mouth Every evening     No Known Allergies  Social History     Socioeconomic History   . Marital status: Married     Spouse name: Not on file   . Number of children: Not on file   . Years of education: Not on file   . Highest education level: Not on file   Occupational History   . Not on file   Tobacco Use   . Smoking status: Former Smoker     Types: Cigars   . Smokeless tobacco: Former Systems developer     Types: Snuff     Quit date: 10/26/2019   . Tobacco comment: 3-4 a week   Substance and Sexual Activity   . Alcohol  use: Yes     Comment: occassionally   . Drug use: No   . Sexual activity: Not on file   Other Topics Concern   . Ability to Walk 1 Flight of Steps without SOB/CP Not Asked   . Routine Exercise Not Asked   . Ability to Walk 2 Flight of Steps without SOB/CP Not Asked   . Unable to Ambulate Not Asked   . Total Care Not Asked   . Ability To Do Own ADL's Not Asked   . Uses Walker Not Asked   . Other Activity Level Not Asked   . Uses Cane Not Asked   Social History Narrative   . Not on file     Social Determinants of Health     Financial Resource Strain: Not on file   Food Insecurity: Not on file   Transportation Needs: Not on file   Physical Activity: Not on file   Stress: Not on file   Intimate Partner Violence: Not on file     Family Medical History:     Problem Relation (Age of Onset)    Cancer Father    Diabetes Mother, Father    Hypertension (High Blood Pressure) Mother            Review of systems:    As per history of present illness or negative for constitutional symptoms, ENT, cardiovascular, respiratory, gastrointestinal, genitourinary; musculoskeletal, skin and/or breasts, neurological, psychiatric, endocrine, hematologic, lymphatic, and allergic immunologic. Also, as per urology H&P forms scanned into the computer.    Physical examination:    There were no vitals taken for this visit.       General: Well-developed, well-nourished male in no apparent distress.  HEENT: Grossly normal.  Chest: Normal excursion.  Abdomen: Soft, without tenderness or mass.  DRE: soft smooth symmetric gland without nodule  Skin: Warm and dry.  Extremities: Moves all extremities.  Neurologic: Alert and oriented in three spheres.    Labs:     Lab Results   Component Value Date/Time    COLOR Yellow 08/24/2019 01:24 PM    SPECGRAVUR 1.017 08/24/2019 01:24 PM    PHURINE 7.5 (H)  08/24/2019 01:24 PM    PROTEIN Negative 08/24/2019 01:24 PM    GLUCOSE Negative 08/24/2019 01:24 PM    KETONES Negative 08/24/2019 01:24 PM    UROBILINOGEN 0.2   08/24/2019 01:24 PM    LEUKOCYTES Negative 08/24/2019 01:24 PM    NITRITE Negative 08/24/2019 01:24 PM    WBC 7.6 10/26/2019 07:04 PM    RBC 5.22 10/26/2019 07:04 PM    BACTERIA None 08/24/2019 01:24 PM    BILIRUBIN Negative 08/24/2019 01:24 PM    BLOOD neg 08/24/2019 12:00 AM    HGBURINE Negative 08/24/2019 01:24 PM    SQUAEPIT None 08/24/2019 01:24 PM         Results for orders placed or performed in visit on 12/06/20 (from the past 72 hour(s))   POCT PVR    Collection Time: 12/06/20 12:00 AM   Result Value Ref Range    URINE TOTAL VOLUME 56m    Results for orders placed or performed in visit on 12/06/20 (from the past 72 hour(s))   POCT HGB A1C    Collection Time: 12/06/20 12:00 AM   Result Value Ref Range    POCT HGB A1C 7.6 (A) 4 - 6 %       Recent Labs     11/25/18  1122   PSA 0.3         Lab Results   Component Value Date    CREATININE 1.05 07/19/2020         Radiographic data:      No results found for this or any previous visit (from the past [id number](s)).  Results for orders placed during the hospital encounter of 12/06/20    XR KUB    Narrative  Elgie L Hue    PROCEDURE DESCRIPTION: XR KUB    CLINICAL INDICATION: Z87.442: History of kidney stones  N40.0: Benign prostatic hyperplasia, unspecified whether lower urinary tract symptoms present  R35.0: Frequency of urination  R39.15: Urgency of urination    TECHNIQUE: 2 views / 2 images submitted.    COMPARISON: 04/30/2017      FINDINGS: Supine views of the abdomen show right pelvic phlebolith. Bowel gas is unremarkable. Small stones project over both kidneys.    Impression  Bilateral nephrolithiasis.                Radiologist location ID: [id number]     No results found for this or any previous visit.        No results found for this or any previous visit.        No results found for this or any previous visit.    No results found for this or any previous visit.          Procedures:    PVR=1 cc    Impression:    1. History of kidney stones    2.  Benign prostatic hyperplasia, unspecified whether lower urinary tract symptoms present    3. Frequency of urination              Plan:    No follow-ups on file.    1.Patient instructed to call office if any problems or changes in condition.    2.UACS and PSA today    3. RTO 1 year with a PSA and KUB prior      DHOLLIE BARTUSwas encouraged to use [facility] to access test results. Our office will contact the patient directly if any concerning abnormalities.    Orders Placed  This Encounter   . URINE CULTURE   . URINALYSIS, MACROSCOPIC AND MICROSCOPIC   . URINALYSIS, MACROSCOPIC   . URINALYSIS, MICROSCOPIC   . POCT PVR         Genia Harold, [facility]

## 2020-12-09 ENCOUNTER — Encounter (INDEPENDENT_AMBULATORY_CARE_PROVIDER_SITE_OTHER): Payer: Self-pay | Admitting: Family Medicine

## 2020-12-09 LAB — URINE CULTURE: URINE CULTURE: NO GROWTH

## 2020-12-13 ENCOUNTER — Other Ambulatory Visit (INDEPENDENT_AMBULATORY_CARE_PROVIDER_SITE_OTHER): Payer: Self-pay

## 2020-12-27 ENCOUNTER — Encounter (HOSPITAL_BASED_OUTPATIENT_CLINIC_OR_DEPARTMENT_OTHER): Payer: BC Managed Care – PPO | Admitting: Surgical

## 2021-01-17 ENCOUNTER — Encounter (HOSPITAL_BASED_OUTPATIENT_CLINIC_OR_DEPARTMENT_OTHER): Payer: Self-pay | Admitting: Surgical

## 2021-01-17 ENCOUNTER — Encounter (HOSPITAL_BASED_OUTPATIENT_CLINIC_OR_DEPARTMENT_OTHER): Payer: BC Managed Care – PPO | Admitting: Surgical

## 2021-01-17 ENCOUNTER — Ambulatory Visit: Payer: BC Managed Care – PPO | Attending: Surgical | Admitting: Surgical

## 2021-01-17 VITALS — BP 120/78 | HR 100 | Temp 97.6°F | Ht 69.0 in | Wt 213.4 lb

## 2021-01-17 DIAGNOSIS — L4050 Arthropathic psoriasis, unspecified: Secondary | ICD-10-CM

## 2021-01-17 DIAGNOSIS — Z79899 Other long term (current) drug therapy: Secondary | ICD-10-CM | POA: Insufficient documentation

## 2021-01-17 NOTE — Progress Notes (Signed)
Subjective  Chase [name] is a 55 y.o. year old male who presents for Psoriatic Arthropathy  to clinic. Last seen on 09/01/2019. At that time, he continued Humira Q 2 weeks.    In the interim, patient reports he continues Humira Q 14 days. He has been having an increase in pain in his knees, hands, and back. He notes in early 10/2020 his back gave out on him and he had difficulty moving for ~1 week. XRs of hips, ankles, and back reviewed from 2020. He notes the other day his knee popped and he had difficulty bearing wt. He did obtain an XR showing arthritis per his report. He did not obtain an MRI. He has been taking Tylenol recently. His psoriasis is well controlled as he states he has not had psoriasis for years. He notes about once per week his right index finger (DIP) will become hot and painful. He has not tried Voltaren gel.    ROS:  All other systems reviewed in detail are negative, except as noted.    Current Outpatient Medications   Medication Sig   . adalimumab (HUMIRA,CF, PEN) 40 mg/0.4 mL Subcutaneous Pen Injector Kit 40 mg by Subcutaneous route Every 14 days   . amLODIPine (NORVASC) 10 mg Oral Tablet Take 1 Tablet (10 mg total) by mouth Once a day   . Blood Sugar Diagnostic (ONETOUCH VERIO) Strip 1 Strip by In Vitro route Three times a day   . Blood-Glucose Meter (ONETOUCH VERIO IQ METER) Misc 1 Each by In Vitro route Three times a day   . enalapril (VASOTEC) 20 mg Oral Tablet Take 1 Tablet (20 mg total) by mouth Once a day   . glipiZIDE (GLUCOTROL XL) 10 mg Oral Tablet Extended Rel 24 hr Take 1 Tablet (10 mg total) by mouth Every morning with breakfast   . lancets (ONETOUCH SURESOFT LANCING DEV) 28 gauge Misc 1 Each by In Vitro route Three times a day   . metoprolol succinate (TOPROL-XL) 50 mg Oral Tablet Sustained Release 24 hr Take 1 Tablet (50 mg total) by mouth Once a day   . rosuvastatin (CRESTOR) 20 mg Oral Tablet Take 1 Tablet (20 mg total) by mouth Every evening     Objective  BP 120/78    Pulse 100   Temp 36.4 C (97.6 F)   Ht 1.753 m (_0 )   Wt 96.8 kg (213 lb 6.5 oz)   SpO2 95%   BMI 31.51 kg/m   General:No acute distress.  Eyes: Conjunctiva clear.  Joints:Trace fullness of left 5th PIP, normal ROM of ankles, no swelling or pain on palpation.  Skin: No facial rash, no psoriasis.    Assessment/Plan  1. Psoriatic Arthritis    2. High risk medication use        1- Psoriatic Arthritis: stable, no active synovitis on exam.  Continue on Humira every 2 weeks.  Continue regular monitoring labs with his PCP; last labs in 11/2020.  Advised to use Voltaren gel 2-3 x per day as needed for pain.    RTC in 6-8 months for F/U; or sooner, if needed.    No orders of the defined types were placed in this encounter.    I am scribing for, and in the presence of, Milton Ferguson [place], [place], for services provided on 01/20/2021.  //  Charyl Bigger, [place] 01/20/2021, 18:09    I personally performed the services described in this documentation, as scribed  in my presence, and it is both  accurate  and complete.    [address] [place], [place]    [place], [place]

## 2021-01-27 DIAGNOSIS — E1169 Type 2 diabetes mellitus with other specified complication: Secondary | ICD-10-CM | POA: Diagnosis not present

## 2021-01-27 DIAGNOSIS — E7800 Pure hypercholesterolemia, unspecified: Secondary | ICD-10-CM | POA: Diagnosis not present

## 2021-01-27 DIAGNOSIS — I10 Essential (primary) hypertension: Secondary | ICD-10-CM | POA: Diagnosis not present

## 2021-02-04 ENCOUNTER — Other Ambulatory Visit (HOSPITAL_BASED_OUTPATIENT_CLINIC_OR_DEPARTMENT_OTHER): Payer: Self-pay | Admitting: Surgical

## 2021-02-04 DIAGNOSIS — E1169 Type 2 diabetes mellitus with other specified complication: Secondary | ICD-10-CM | POA: Diagnosis not present

## 2021-02-04 DIAGNOSIS — E7800 Pure hypercholesterolemia, unspecified: Secondary | ICD-10-CM | POA: Diagnosis not present

## 2021-02-04 DIAGNOSIS — Z125 Encounter for screening for malignant neoplasm of prostate: Secondary | ICD-10-CM | POA: Diagnosis not present

## 2021-02-04 DIAGNOSIS — L4050 Arthropathic psoriasis, unspecified: Secondary | ICD-10-CM

## 2021-02-05 NOTE — Telephone Encounter (Signed)
Current Outpatient Medications   Medication Sig   . adalimumab (HUMIRA,CF, PEN) 40 mg/0.4 mL Subcutaneous Pen Injector Kit 40 mg by Subcutaneous route Every 14 days   . amLODIPine (NORVASC) 10 mg Oral Tablet Take 1 Tablet (10 mg total) by mouth Once a day   . Blood Sugar Diagnostic (ONETOUCH VERIO) Strip 1 Strip by In Vitro route Three times a day   . Blood-Glucose Meter (ONETOUCH VERIO IQ METER) Misc 1 Each by In Vitro route Three times a day   . enalapril (VASOTEC) 20 mg Oral Tablet Take 1 Tablet (20 mg total) by mouth Once a day   . glipiZIDE (GLUCOTROL XL) 10 mg Oral Tablet Extended Rel 24 hr Take 1 Tablet (10 mg total) by mouth Every morning with breakfast   . lancets (ONETOUCH SURESOFT LANCING DEV) 28 gauge Misc 1 Each by In Vitro route Three times a day   . metoprolol succinate (TOPROL-XL) 50 mg Oral Tablet Sustained Release 24 hr Take 1 Tablet (50 mg total) by mouth Once a day   . rosuvastatin (CRESTOR) 20 mg Oral Tablet Take 1 Tablet (20 mg total) by mouth Every evening     Last dept visit:  01/17/2021  Next pending dept visit: 09/19/2021    Electa Sniff, MA 02/05/2021, 10:11

## 2021-02-08 DIAGNOSIS — M79645 Pain in left finger(s): Secondary | ICD-10-CM | POA: Diagnosis not present

## 2021-02-28 ENCOUNTER — Other Ambulatory Visit (HOSPITAL_BASED_OUTPATIENT_CLINIC_OR_DEPARTMENT_OTHER): Payer: Self-pay | Admitting: Medical

## 2021-02-28 ENCOUNTER — Other Ambulatory Visit (INDEPENDENT_AMBULATORY_CARE_PROVIDER_SITE_OTHER): Payer: Self-pay | Admitting: Family Medicine

## 2021-02-28 DIAGNOSIS — E7800 Pure hypercholesterolemia, unspecified: Secondary | ICD-10-CM

## 2021-03-07 ENCOUNTER — Encounter (INDEPENDENT_AMBULATORY_CARE_PROVIDER_SITE_OTHER): Payer: Self-pay | Admitting: Family Medicine

## 2021-03-08 ENCOUNTER — Other Ambulatory Visit: Payer: Self-pay

## 2021-03-08 ENCOUNTER — Ambulatory Visit (HOSPITAL_COMMUNITY)
Admission: EM | Admit: 2021-03-08 | Discharge: 2021-03-08 | Disposition: A | Discharge status: Home / Self Care | Payer: BC Managed Care – PPO

## 2021-03-08 ENCOUNTER — Encounter (HOSPITAL_COMMUNITY): Payer: Self-pay

## 2021-03-08 DIAGNOSIS — J111 Influenza due to unidentified influenza virus with other respiratory manifestations: Secondary | ICD-10-CM

## 2021-03-08 DIAGNOSIS — R059 Cough, unspecified: Secondary | ICD-10-CM

## 2021-03-08 MED ORDER — PROMETHAZINE-DM 6.25-15 MG/5ML PO SYRP: 5.0000 mL | ORAL_SOLUTION | Freq: Four times a day (QID) | ORAL | 0 refills | Status: DC | PRN

## 2021-03-08 MED ORDER — BENZONATATE 100 MG PO CAPS: 100.0000 mg | ORAL_CAPSULE | Freq: Three times a day (TID) | ORAL | 0 refills | Status: DC

## 2021-03-08 NOTE — ED Provider Notes (Signed)
[facility]    CSN: [id number] Arrival date & time: 03/08/21  1652      History   Chief Complaint Chief Complaint  Patient presents with  . Fever  . Headache    HPI Kyle Hughes is a 55 y.o. male presenting with temperature of 102, headaches and cough for the last 2 days.  Medical history noncontributory. Has been taking ibuprofen for fever reduction, last dose of this was 2 hours ago.  Cough is nonproductive.  Throbbing headaches behind forehead. Denies n/v/d, shortness of breath, chest pain, facial pain, teeth pain, sore throat, loss of taste/smell, swollen lymph nodes, ear pain, dizziness.   HPI  History reviewed. No pertinent past medical history.  There are no problems to display for this patient.   History reviewed. No pertinent surgical history.     Home Medications    Prior to Admission medications   Medication Sig Start Date End Date Taking? Authorizing Provider  benzonatate (TESSALON) 100 MG capsule Take 1 capsule (100 mg total) by mouth every 8 (eight) hours. 03/08/21  Yes Rhys Martini, PA-C  ibuprofen (ADVIL) 400 MG tablet Take 400 mg by mouth every 6 (six) hours as needed.   Yes [provider]  promethazine-dextromethorphan (PROMETHAZINE-DM) 6.25-15 MG/5ML syrup Take 5 mLs by mouth 4 (four) times daily as needed for cough. 03/08/21  Yes Rhys Martini, PA-C  AMLODIPINE BESYLATE PO Take 1 tablet by mouth daily.    [provider]  ATORVASTATIN CALCIUM PO Take by mouth.    [provider]  cyclobenzaprine (FLEXERIL) 10 MG tablet Take 1 tablet by mouth 3 times daily as needed for muscle spasm. Warning: May cause drowsiness. 07/01/19   Mardella Layman, MD  Empagliflozin (JARDIANCE PO) Take by mouth.    [provider]  LOSARTAN POTASSIUM PO Take 1 tablet by mouth daily.    [provider]  METFORMIN HCL PO Take 1 tablet by mouth daily.    [provider]  METOPROLOL SUCCINATE PO Take by mouth.     [provider]  traMADol (ULTRAM) 50 MG tablet Take 1 tablet (50 mg total) by mouth every 6 (six) hours as needed. 07/01/19   Mardella Layman, MD    Family History Family History  Family history unknown: Yes    Social History Social History   Tobacco Use  . Smoking status: Former Games developer  . Smokeless tobacco: Never Used  Substance Use Topics  . Alcohol use: Not Currently  . Drug use: Never     Allergies   Patient has no known allergies.   Review of Systems Review of Systems  Constitutional: Positive for chills and fever. Negative for appetite change.  HENT: Positive for congestion. Negative for ear pain, rhinorrhea, sinus pressure, sinus pain and sore throat.   Eyes: Negative for redness and visual disturbance.  Respiratory: Positive for cough. Negative for chest tightness, shortness of breath and wheezing.   Cardiovascular: Negative for chest pain and palpitations.  Gastrointestinal: Negative for abdominal pain, constipation, diarrhea, nausea and vomiting.  Genitourinary: Negative for dysuria, frequency and urgency.  Musculoskeletal: Positive for myalgias.  Neurological: Negative for dizziness, weakness and headaches.  Psychiatric/Behavioral: Negative for confusion.  All other systems reviewed and are negative.    Physical Exam Triage Vital Signs ED Triage Vitals  Enc Vitals Group     BP 03/08/21 1736 117/82     Pulse Rate 03/08/21 1736 81     Resp 03/08/21 1736 18  Temp 03/08/21 1736 (S) (!) 100.5 F (38.1 C)     Temp src --      SpO2 03/08/21 1736 98 %     Weight --      Height --      Head Circumference --      Peak Flow --      Pain Score 03/08/21 1738 4     Pain Loc --      Pain Edu? --      Excl. in GC? --    No data found.  Updated Vital Signs BP 117/82   Pulse 81   Temp (S) (!) 100.5 F (38.1 C) Comment: took ibuprofen at 3  Resp 18   SpO2 98%   Visual Acuity Right Eye Distance:   Left Eye Distance:   Bilateral Distance:     Right Eye Near:   Left Eye Near:    Bilateral Near:     Physical Exam Vitals reviewed.  Constitutional:      General: He is not in acute distress.    Appearance: Normal appearance. He is ill-appearing.  HENT:     Head: Normocephalic and atraumatic.     Right Ear: Hearing, tympanic membrane, ear canal and external ear normal. No swelling or tenderness. There is no impacted cerumen. No mastoid tenderness. Tympanic membrane is not perforated, erythematous, retracted or bulging.     Left Ear: Hearing, tympanic membrane, ear canal and external ear normal. No swelling or tenderness. There is no impacted cerumen. No mastoid tenderness. Tympanic membrane is not perforated, erythematous, retracted or bulging.     Nose:     Right Sinus: No maxillary sinus tenderness or frontal sinus tenderness.     Left Sinus: No maxillary sinus tenderness or frontal sinus tenderness.     Mouth/Throat:     Mouth: Mucous membranes are moist.     Pharynx: Uvula midline. No oropharyngeal exudate or posterior oropharyngeal erythema.     Tonsils: No tonsillar exudate.  Cardiovascular:     Rate and Rhythm: Normal rate and regular rhythm.     Heart sounds: Normal heart sounds.  Pulmonary:     Breath sounds: Normal breath sounds and air entry. No wheezing, rhonchi or rales.  Chest:     Chest wall: No tenderness.  Abdominal:     General: Abdomen is flat. Bowel sounds are normal.     Tenderness: There is no abdominal tenderness. There is no guarding or rebound.  Lymphadenopathy:     Cervical: No cervical adenopathy.  Skin:    Capillary Refill: Capillary refill takes less than 2 seconds.  Neurological:     General: No focal deficit present.     Mental Status: He is alert and oriented to person, place, and time.  Psychiatric:        Attention and Perception: Attention and perception normal.        Mood and Affect: Mood and affect normal.        Behavior: Behavior normal. Behavior is cooperative.        Thought  Content: Thought content normal.        Judgment: Judgment normal.      UC Treatments / Results  Labs (all labs ordered are listed, but only abnormal results are displayed) Labs Reviewed - No data to display  EKG   Radiology No results found.  Procedures Procedures (including critical care time)  Medications Ordered in UC Medications - No data to display  Initial Impression / Assessment  and Plan / UC Course  I have reviewed the triage vital signs and the nursing notes.  Pertinent labs & imaging results that were available during my care of the patient were reviewed by me and considered in my medical decision making (see chart for details).     This patient is a 55 year old male presenting with flulike symptoms.  He is mildly febrile at 100.5, last dose of antipyretic was 3 hours ago.  He is nontachycardic, nontachypneic, oxygenating well on room air, no wheezes rhonchi or rales.  Appears well-hydrated.  COVID PCR declined.  Rapid flu deferred given national shortage of these.  Continue alternating Tylenol and ibuprofen for fever reduction.  Promethazine and Tessalon sent for cough.  Rest, good hydration.  Work note provided.  ED return precautions discussed.  Final Clinical Impressions(s) / UC Diagnoses   Final diagnoses:  Influenza-like illness  Cough     Discharge Instructions     -You probably have the flu.  Unfortunately, we cannot test for this given the national shortage of these tests. -Promethazine DM cough syrup for congestion/cough. This could make you drowsy, so take at night before bed. -Tessalon (Benzonatate) as needed for cough. Take one pill up to 3x daily (every 8 hours) -For fever/chills, body aches, headaches- Take Tylenol 1000 mg 3 times daily, and ibuprofen 800 mg 3 times daily with food.  You can take these together, or alternate every 3-4 hours. -With a virus, you're typically contagious for 5-7 days, or as long as you're having fevers.  Stay  out of work for the duration of time.  Work note provided. -Seek additional immediate medical attention if you develop new symptoms like chest pain, dizziness, shortness of breath, fevers and chills over 103 that do not reduce with Tylenol or ibuprofen.     ED Prescriptions    Medication Sig Dispense Auth. Provider   promethazine-dextromethorphan (PROMETHAZINE-DM) 6.25-15 MG/5ML syrup Take 5 mLs by mouth 4 (four) times daily as needed for cough. 118 mL Rhys Martini, PA-C   benzonatate (TESSALON) 100 MG capsule Take 1 capsule (100 mg total) by mouth every 8 (eight) hours. 21 capsule Rhys Martini, PA-C     PDMP not reviewed this encounter.   Rhys Martini, PA-C 03/08/21 Flossie Buffy

## 2021-03-08 NOTE — Discharge Instructions (Signed)
-  You probably have the flu.  Unfortunately, we cannot test for this given the national shortage of these tests. -Promethazine DM cough syrup for congestion/cough. This could make you drowsy, so take at night before bed. -Tessalon (Benzonatate) as needed for cough. Take one pill up to 3x daily (every 8 hours) -For fever/chills, body aches, headaches- Take Tylenol 1000 mg 3 times daily, and ibuprofen 800 mg 3 times daily with food.  You can take these together, or alternate every 3-4 hours. -With a virus, you're typically contagious for 5-7 days, or as long as you're having fevers.  Stay out of work for the duration of time.  Work note provided. -Seek additional immediate medical attention if you develop new symptoms like chest pain, dizziness, shortness of breath, fevers and chills over 103 that do not reduce with Tylenol or ibuprofen.

## 2021-03-08 NOTE — ED Triage Notes (Signed)
Pt presents with c/o fever reported at 102, headache and cough for past couple of days

## 2021-03-11 DIAGNOSIS — R509 Fever, unspecified: Secondary | ICD-10-CM | POA: Diagnosis not present

## 2021-03-11 DIAGNOSIS — R059 Cough, unspecified: Secondary | ICD-10-CM | POA: Diagnosis not present

## 2021-03-11 DIAGNOSIS — R0981 Nasal congestion: Secondary | ICD-10-CM | POA: Diagnosis not present

## 2021-03-11 DIAGNOSIS — U071 COVID-19: Secondary | ICD-10-CM | POA: Diagnosis not present

## 2021-05-11 ENCOUNTER — Other Ambulatory Visit (INDEPENDENT_AMBULATORY_CARE_PROVIDER_SITE_OTHER): Payer: Self-pay | Admitting: Family Medicine

## 2021-05-11 DIAGNOSIS — I10 Essential (primary) hypertension: Secondary | ICD-10-CM

## 2021-05-30 DIAGNOSIS — E119 Type 2 diabetes mellitus without complications: Secondary | ICD-10-CM | POA: Diagnosis not present

## 2021-05-30 DIAGNOSIS — Z1211 Encounter for screening for malignant neoplasm of colon: Secondary | ICD-10-CM | POA: Diagnosis not present

## 2021-06-13 ENCOUNTER — Encounter (INDEPENDENT_AMBULATORY_CARE_PROVIDER_SITE_OTHER): Payer: BC Managed Care – PPO | Admitting: Family Medicine

## 2021-06-13 DIAGNOSIS — I10 Essential (primary) hypertension: Secondary | ICD-10-CM

## 2021-06-13 DIAGNOSIS — E1100 Type 2 diabetes mellitus with hyperosmolarity without nonketotic hyperglycemic-hyperosmolar coma (NKHHC): Secondary | ICD-10-CM

## 2021-06-13 DIAGNOSIS — Z1211 Encounter for screening for malignant neoplasm of colon: Secondary | ICD-10-CM

## 2021-06-13 DIAGNOSIS — K219 Gastro-esophageal reflux disease without esophagitis: Secondary | ICD-10-CM

## 2021-06-13 DIAGNOSIS — E7800 Pure hypercholesterolemia, unspecified: Secondary | ICD-10-CM

## 2021-06-13 DIAGNOSIS — L4050 Arthropathic psoriasis, unspecified: Secondary | ICD-10-CM

## 2021-06-13 DIAGNOSIS — I2510 Atherosclerotic heart disease of native coronary artery without angina pectoris: Secondary | ICD-10-CM

## 2021-06-13 DIAGNOSIS — Z125 Encounter for screening for malignant neoplasm of prostate: Secondary | ICD-10-CM

## 2021-07-18 ENCOUNTER — Encounter (INDEPENDENT_AMBULATORY_CARE_PROVIDER_SITE_OTHER): Payer: BC Managed Care – PPO | Admitting: Family Medicine

## 2021-07-18 DIAGNOSIS — I2510 Atherosclerotic heart disease of native coronary artery without angina pectoris: Secondary | ICD-10-CM

## 2021-07-18 DIAGNOSIS — E7800 Pure hypercholesterolemia, unspecified: Secondary | ICD-10-CM

## 2021-07-18 DIAGNOSIS — Z1211 Encounter for screening for malignant neoplasm of colon: Secondary | ICD-10-CM

## 2021-07-18 DIAGNOSIS — Z125 Encounter for screening for malignant neoplasm of prostate: Secondary | ICD-10-CM

## 2021-07-18 DIAGNOSIS — K219 Gastro-esophageal reflux disease without esophagitis: Secondary | ICD-10-CM

## 2021-07-18 DIAGNOSIS — L4050 Arthropathic psoriasis, unspecified: Secondary | ICD-10-CM

## 2021-07-18 DIAGNOSIS — I10 Essential (primary) hypertension: Secondary | ICD-10-CM

## 2021-07-18 DIAGNOSIS — E1100 Type 2 diabetes mellitus with hyperosmolarity without nonketotic hyperglycemic-hyperosmolar coma (NKHHC): Secondary | ICD-10-CM

## 2021-09-06 ENCOUNTER — Ambulatory Visit (INDEPENDENT_AMBULATORY_CARE_PROVIDER_SITE_OTHER): Payer: BC Managed Care – PPO | Admitting: PHYSICIAN ASSISTANT

## 2021-09-06 ENCOUNTER — Encounter (INDEPENDENT_AMBULATORY_CARE_PROVIDER_SITE_OTHER): Payer: Self-pay | Admitting: PHYSICIAN ASSISTANT

## 2021-09-06 ENCOUNTER — Other Ambulatory Visit: Payer: Self-pay

## 2021-09-06 VITALS — BP 110/64 | HR 81 | Temp 97.4°F

## 2021-09-06 DIAGNOSIS — H6591 Unspecified nonsuppurative otitis media, right ear: Secondary | ICD-10-CM

## 2021-09-06 DIAGNOSIS — H6981 Other specified disorders of Eustachian tube, right ear: Secondary | ICD-10-CM

## 2021-09-06 DIAGNOSIS — Z87891 Personal history of nicotine dependence: Secondary | ICD-10-CM

## 2021-09-06 DIAGNOSIS — R0982 Postnasal drip: Secondary | ICD-10-CM

## 2021-09-06 MED ORDER — DEXAMETHASONE SODIUM PHOSPHATE 4 MG/ML INJECTION SOLUTION
6.0000 mg | INTRAMUSCULAR | Status: AC
Start: 2021-09-06 — End: 2021-09-06
  Administered 2021-09-06: 6 mg via INTRAMUSCULAR

## 2021-09-06 MED ORDER — AZELASTINE 137 MCG (0.1 %) NASAL SPRAY
2.0000 | Freq: Two times a day (BID) | NASAL | 0 refills | Status: DC
Start: 2021-09-06 — End: 2022-05-20

## 2021-09-06 NOTE — Progress Notes (Signed)
[facility]  [place]  [place] [address]       Name: Chase [name] MRN:  [id number]   Date: 09/06/2021 Age: 55 y.o.     Chief Complaint            Ear Pain           HPI: Chase [name] is a 55 y.o. male who presents today with right-sided ear discomfort times a week.  [facility] and was diagnosed with the in inner an external ear infection and prescribed Augmentin and Ciprodex.  Patient feels like is not any better.  No abnormal pain identified.  Does report some decrease in hearing.  No dizzy spells.  No blurry vision double vision or sensitive to light or sound.  No nasal congestion or drainage.  No over-the-counter measures helping.  No remitting factors otherwise specified.  No modifying factors associated symptoms not previously listed.    History:  Vital signs and history as obtained by clinical staff.  Past Medical History:   Diagnosis Date    Arthritis with psoriasis (CMS HCC)     Arthropathy     psoriatic    CAD (coronary artery disease)     Diabetes mellitus, type 2 (CMS HCC) 03/19/2009    Fracture of distal end of tibia     left    Fracture of distal fibula     left    GERD (gastroesophageal reflux disease)     Hypertension     Kidney stones     Right shoulder pain     Type 2 diabetes mellitus (CMS HCC)     Urinary calculus, unspecified     Renal stones         Past Surgical History:   Procedure Laterality Date    HX APPENDECTOMY      2017    HX CARPAL [place]    bil    HX HEART CATHETERIZATION  11/09    HX HERNIA REPAIR  1970    2010    LITHOTRIPSY  4403    PB LAP UMBILICAL HERNIA REPAIR  2009         Family Medical History:     Problem Relation (Age of Onset)    Cancer Father    Diabetes Mother, Father    Hypertension (High Blood Pressure) Mother          Social History     Socioeconomic History    Marital status: Married   Tobacco Use    Smoking status: Former      Types: Cigars    Smokeless tobacco: Former     Types: Snuff     Quit date: 10/26/2019    Tobacco comments:     3-4 a week   Substance and Sexual Activity    Alcohol use: Yes     Comment: occassionally    Drug use: No     Expanded Substance History     Additional history       Allergies:  No Known Allergies  Problem List:  Patient Active Problem List    Diagnosis    Tubular adenoma of colon    Right shoulder pain    History of kidney stones    Screening PSA (prostate specific antigen)    Screen for colon cancer    Worsening headaches    Pure hypercholesterolemia    Back  pain    Diabetes mellitus, type 2 (CMS HCC)    Essential hypertension    Gastroesophageal reflux disease without esophagitis    Urinary calculus, unspecified    Psoriatic arthritis (CMS HCC)    Fracture of distal end of tibia    Fracture of distal fibula    Coronary artery disease involving native coronary artery without angina pectoris     Medication:  Outpatient Encounter Medications as of 09/06/2021   Medication Sig Dispense Refill    amLODIPine (NORVASC) 10 mg Oral Tablet Take 1 Tablet (10 mg total) by mouth Once a day 90 Tablet 3    amoxicillin-pot clavulanate (AUGMENTIN) 500-125 mg Oral Tablet Take 1 Tablet by mouth Every 12 hours      azelastine (ASTELIN) 137 mcg (0.1 %) Nasal Aerosol, Spray Administer 2 Sprays into each nostril Twice daily Use in each nostril as directed 30 mL 0    Blood Sugar Diagnostic (ONETOUCH VERIO) Strip 1 Strip by In Vitro route Three times a day 100 Strip 11    Blood-Glucose Meter (ONETOUCH VERIO IQ METER) Misc 1 Each by In Vitro route Three times a day 1 Each 0    ciprofloxacin-dexAMETHasone (CIPRODEX) 0.3-0.1 % Otic Drops, Suspension INSTILL 4 DROPS INTO AFFECTED EAR(S) EVERY 12 HOURS FOR 7 DAYS      enalapril (VASOTEC) 20 mg Oral Tablet Take 1 Tablet (20 mg total) by mouth Once a day 90 Tablet 3    glipiZIDE (GLUCOTROL XL) 10 mg Oral Tablet Extended Rel 24 hr Take 1 Tablet (10 mg total)  by mouth Every morning with breakfast 90 Tablet 3    HUMIRA,CF, PEN 40 mg/0.4 mL Subcutaneous Pen Injector Kit INJECT 40 MG UNDER THE SKIN EVERY 14 DAYS 2 Each 5    lancets (ONETOUCH SURESOFT LANCING DEV) 28 gauge Misc 1 Each by In Vitro route Three times a day 100 Each 11    metoprolol succinate (TOPROL-XL) 50 mg Oral Tablet Sustained Release 24 hr Take 1 Tablet (50 mg total) by mouth Once a day 90 Tablet 3    rosuvastatin (CRESTOR) 20 mg Oral Tablet Take 1 Tablet (20 mg total) by mouth Every evening 90 Tablet 3    Tadalafil (CIALIS) 5 mg Oral Tablet TAKE AS DIRECTED BY PRESCRIBER OR PACKAGE INSTRUCTIONS 90 Tablet 3     Facility-Administered Encounter Medications as of 09/06/2021   Medication Dose Route Frequency Provider Last Rate Last Admin    dexamethasone 4 mg/mL injection  6 mg IntraMUSCULAR Now [place], [place], [place]           Review of Systems:  All pertinent positives in regards review systems noted HPI.  Rest review of systems unremarkable.  Exam:  Vital: BP 110/64   Pulse 81   Temp 36.3 C (97.4 F)   SpO2 98%       General: alert, cooperative, moderate distress, appears stated age  Eyes: Conjunctivae/corneas clear, PERRLA.  Ears: Left TM and canal normal.  Right TM bulging without erythema and canal normal. External ear canals clear  Nose: Nares pale boggy and swollen worse on the right than the left  Throat: Pharynx without exudate. No oral lesions.  Lips, mucosa, and tongue normal.  Head - Normocephalic, without obvious abnormality, atraumatic  Neck- supple, symmetrical, trachea midline  Lungs: clear to auscultation bilaterally. No crackles, no wheezes   Cardiovascular: Heart regular rate and rhythm, no significant murmurs, no carotid bruits  Abdomen: soft, non-tender, non-distended, no hepatosplenomegaly, no masses and no hernias  Extremities:  extremities normal, atraumatic, no cyanosis or edema  Skin: Skin color, texture, turgor normal. No rash.  Neurologic: alert and oriented x3.     Assessment and  Plan:    ICD-10-CM    1. Right serous otitis media  H65.91       2. Acute dysfunction of right eustachian tube  H69.81       3. PND (post-nasal drip)  R09.82         Medication Orders   Medications    dexamethasone 4 mg/mL injection     Freq: Now    azelastine (ASTELIN) 137 mcg (0.1 %) Nasal Aerosol, Spray     Sig: Administer 2 Sprays into each nostril Twice daily Use in each nostril as directed     Dispense:  30 mL     Refill:  0   One week history of right-sided ear fullness and decrease in hearing.  Patient is [facility] as well as Ciprodex drops.  Patient feels like he is not getting any better.  There is no signs of active bacterial infection on exam.  Moderate serous otitis media likely secondary to eustachian tube dysfunction on the right.  Patient has no sinus symptoms whatsoever.  Trial Astepro twice a day for up to 2 weeks and consider ENT for audiology if no better.  Decadron given x1 in office.  Patient advised to watch glucose levels, reports last A1c was 7.  Aftercare discussed.  Handout given.  Recheck as discussed.  Follow up with family doctor within a week and half no better.  supportive care discussed  ddx discussed at length  otc measures  medication as directed  recheck as needed  f/u pcp as needed/discussed  ED if worsening    Myrtie [place], [place]    Portions of this note may be dictated using voice recognition software or a dictation service. Variances in spelling and vocabulary are possible and unintentional. Not all errors are caught/corrected. Please notify the [facility] if any discrepancies are noted or if the meaning of any statement is not clear.

## 2021-09-18 NOTE — Progress Notes (Unsigned)
Subjective  Chase [name] is a 55 y.o. year old male who presents for No chief complaint on file.  to clinic. Last seen on 01/17/2021  . At that time, he continued Humira Q 2 weeks.    In the interim, patient reports he continues Humira Q 14 days. He has been having an increase in pain in his knees, hands, and back. He notes in early 10/2020 his back gave out on him and he had difficulty moving for ~1 week. XRs of hips, ankles, and back reviewed from 2020. He notes the other day his knee popped and he had difficulty bearing wt. He did obtain an XR showing arthritis per his report. He did not obtain an MRI. He has been taking Tylenol recently. His psoriasis is well controlled as he states he has not had psoriasis for years. He notes about once per week his right index finger (DIP) will become hot and painful. He has not tried Voltaren gel.    ROS:  All other systems reviewed in detail are negative, except as noted.    Current Outpatient Medications   Medication Sig    amLODIPine (NORVASC) 10 mg Oral Tablet Take 1 Tablet (10 mg total) by mouth Once a day    amoxicillin-pot clavulanate (AUGMENTIN) 500-125 mg Oral Tablet Take 1 Tablet by mouth Every 12 hours    azelastine (ASTELIN) 137 mcg (0.1 %) Nasal Aerosol, Spray Administer 2 Sprays into each nostril Twice daily Use in each nostril as directed    Blood Sugar Diagnostic (ONETOUCH VERIO) Strip 1 Strip by In Vitro route Three times a day    Blood-Glucose Meter (ONETOUCH VERIO IQ METER) Misc 1 Each by In Vitro route Three times a day    ciprofloxacin-dexAMETHasone (CIPRODEX) 0.3-0.1 % Otic Drops, Suspension INSTILL 4 DROPS INTO AFFECTED EAR(S) EVERY 12 HOURS FOR 7 DAYS    enalapril (VASOTEC) 20 mg Oral Tablet Take 1 Tablet (20 mg total) by mouth Once a day    glipiZIDE (GLUCOTROL XL) 10 mg Oral Tablet Extended Rel 24 hr Take 1 Tablet (10 mg total) by mouth Every morning with breakfast    HUMIRA,CF, PEN 40 mg/0.4 mL Subcutaneous Pen Injector Kit INJECT 40 MG  UNDER THE SKIN EVERY 14 DAYS    lancets (ONETOUCH SURESOFT LANCING DEV) 28 gauge Misc 1 Each by In Vitro route Three times a day    metoprolol succinate (TOPROL-XL) 50 mg Oral Tablet Sustained Release 24 hr Take 1 Tablet (50 mg total) by mouth Once a day    rosuvastatin (CRESTOR) 20 mg Oral Tablet Take 1 Tablet (20 mg total) by mouth Every evening    Tadalafil (CIALIS) 5 mg Oral Tablet TAKE AS DIRECTED BY PRESCRIBER OR PACKAGE INSTRUCTIONS     Objective  There were no vitals taken for this visit.  General:No acute distress.  Eyes: Conjunctiva clear.  Joints:Trace fullness of left 5th PIP, normal ROM of ankles, no swelling or pain on palpation.  Skin: No facial rash, no psoriasis.    Assessment/Plan  1. Psoriatic arthritis (CMS [place])    2. High risk medication use        1- Psoriatic Arthritis: stable, no active synovitis on exam.  Continue on Humira every 2 weeks.  Continue regular monitoring labs with his PCP; last labs in 11/2020.  Advised to use Voltaren gel 2-3 x per day as needed for pain.    RTC in 6-8 months for F/U; or sooner, if needed.    No orders of  the defined types were placed in this encounter.      [address] [place], [place]

## 2021-09-19 ENCOUNTER — Encounter (HOSPITAL_BASED_OUTPATIENT_CLINIC_OR_DEPARTMENT_OTHER): Payer: BC Managed Care – PPO | Admitting: Surgical

## 2021-10-02 ENCOUNTER — Ambulatory Visit (INDEPENDENT_AMBULATORY_CARE_PROVIDER_SITE_OTHER): Payer: BC Managed Care – PPO | Admitting: Family Medicine

## 2021-10-02 ENCOUNTER — Encounter (INDEPENDENT_AMBULATORY_CARE_PROVIDER_SITE_OTHER): Payer: Self-pay | Admitting: Family Medicine

## 2021-10-02 ENCOUNTER — Other Ambulatory Visit: Payer: Self-pay

## 2021-10-02 VITALS — BP 124/78 | HR 77 | Temp 98.3°F | Resp 16 | Ht 69.02 in | Wt 215.0 lb

## 2021-10-02 DIAGNOSIS — L4050 Arthropathic psoriasis, unspecified: Secondary | ICD-10-CM

## 2021-10-02 DIAGNOSIS — E119 Type 2 diabetes mellitus without complications: Secondary | ICD-10-CM

## 2021-10-02 DIAGNOSIS — E1100 Type 2 diabetes mellitus with hyperosmolarity without nonketotic hyperglycemic-hyperosmolar coma (NKHHC): Secondary | ICD-10-CM

## 2021-10-02 DIAGNOSIS — Z125 Encounter for screening for malignant neoplasm of prostate: Secondary | ICD-10-CM

## 2021-10-02 DIAGNOSIS — E7800 Pure hypercholesterolemia, unspecified: Secondary | ICD-10-CM

## 2021-10-02 DIAGNOSIS — K219 Gastro-esophageal reflux disease without esophagitis: Secondary | ICD-10-CM

## 2021-10-02 DIAGNOSIS — I2510 Atherosclerotic heart disease of native coronary artery without angina pectoris: Secondary | ICD-10-CM

## 2021-10-02 DIAGNOSIS — Z1211 Encounter for screening for malignant neoplasm of colon: Secondary | ICD-10-CM

## 2021-10-02 DIAGNOSIS — I10 Essential (primary) hypertension: Secondary | ICD-10-CM

## 2021-10-02 LAB — POCT HGB A1C: POCT HGB A1C: 8.7 % — AB (ref 4–6)

## 2021-10-02 NOTE — Progress Notes (Signed)
[facility]  [address]   [place], [place]  [address]  Dept. Phone : [phone]  Dept. Fax: [phone]          Chase [name]  1965/11/29  [id number]    Date of Service: 10/02/2021  3:15 PM EST    Chief complaint:   Chief Complaint   Patient presents with   . Follow Up 3 Months       The patient is here for follow up of their chronic conditions. He had some problems with a right ear infection recently . Has since cleared up .     Subjective:     This is a case of a 55 y.o. year old male who comes in today for a routine follow up .         The patient's chronic conditions are stable and unchanged . See Problem List for chronic conditions, which have been reviewed today.       Current Outpatient Medications   Medication Sig   . amLODIPine (NORVASC) 10 mg Oral Tablet Take 1 Tablet (10 mg total) by mouth Once a day   . azelastine (ASTELIN) 137 mcg (0.1 %) Nasal Aerosol, Spray Administer 2 Sprays into each nostril Twice daily Use in each nostril as directed   . Blood Sugar Diagnostic (ONETOUCH VERIO) Strip 1 Strip by In Vitro route Three times a day   . Blood-Glucose Meter (ONETOUCH VERIO IQ METER) Misc 1 Each by In Vitro route Three times a day   . enalapril (VASOTEC) 20 mg Oral Tablet Take 1 Tablet (20 mg total) by mouth Once a day   . glipiZIDE (GLUCOTROL XL) 10 mg Oral Tablet Extended Rel 24 hr Take 1 Tablet (10 mg total) by mouth Every morning with breakfast   . HUMIRA,CF, PEN 40 mg/0.4 mL Subcutaneous Pen Injector Kit INJECT 40 MG UNDER THE SKIN EVERY 14 DAYS   . lancets (ONETOUCH SURESOFT LANCING DEV) 28 gauge Misc 1 Each by In Vitro route Three times a day   . metoprolol succinate (TOPROL-XL) 50 mg Oral Tablet Sustained Release 24 hr Take 1 Tablet (50 mg total) by mouth Once a day   . rosuvastatin (CRESTOR) 20 mg Oral Tablet Take 1 Tablet (20 mg total) by mouth Every evening   . Tadalafil (CIALIS) 5 mg Oral Tablet TAKE AS DIRECTED BY PRESCRIBER OR PACKAGE INSTRUCTIONS     Patient Active  Problem List   Diagnosis   . Essential hypertension   . Gastroesophageal reflux disease without esophagitis   . Urinary calculus, unspecified   . Psoriatic arthritis (CMS [place])   . Fracture of distal end of tibia   . Fracture of distal fibula   . Coronary artery disease involving native coronary artery without angina pectoris   . Diabetes mellitus, type 2 (CMS HCC)   . Back pain   . Pure hypercholesterolemia   . Worsening headaches   . Screening PSA (prostate specific antigen)   . Screen for colon cancer   . History of kidney stones   . Right shoulder pain   . Tubular adenoma of colon     Past Surgical History:   Procedure Laterality Date   . HX APPENDECTOMY      2017   . HX CARPAL TUNNEL RELEASE  1990    bil   . HX HEART CATHETERIZATION  11/09   . [place]    2010   . LITHOTRIPSY  2004   .  PB LAP UMBILICAL HERNIA REPAIR  2009         Family Medical History:     Problem Relation (Age of Onset)    Cancer Father    Diabetes Mother, Father    Hypertension (High Blood Pressure) Mother          Social History     Socioeconomic History   . Marital status: Married   Tobacco Use   . Smoking status: Former     Types: Cigars   . Smokeless tobacco: Former     Types: Snuff     Quit date: 10/26/2019   . Tobacco comments:     3-4 a week   Substance and Sexual Activity   . Alcohol use: Yes     Comment: occassionally   . Drug use: No     Past Surgical History:   Procedure Laterality Date   . COLONOSCOPY N/A 10/23/2016    Performed by Melida Quitter, MD at [place]   . HX APPENDECTOMY      2017   . HX CARPAL TUNNEL RELEASE  1990    bil   . HX HEART CATHETERIZATION  11/09   . [place]    2010   . LITHOTRIPSY  2004   . PB LAP UMBILICAL HERNIA REPAIR  2009         REVIEW OF SYSTEMS:  General: (-) fevers (-) chills. (-) weight loss. (-) fatigue.  Lymphatic: (-) palpable masses. (-) night sweats.  Heme: (-) easy bruising (-) bleeding. (-) recurrent infections.   HEENT. (-) vision changes (-) hearing changes. (-)  dysphagia. (-) sore throat.   Heart: (-) chest pain. (-) palpitation. (-) orthopnea. (-) LE edema.   Lungs: (-) dyspnea (on exertion) (-) hemoptysis. (-) cough.   Abdomen: (-) poor appetite. (-) abdominal pain. (-) nausea (-) vomiting. (-) diarrhea. (-) constipation.   GU: (-) dysuria (-) Urgency. (-) Hematuria.   MS. (-) joint pain (-) ext swelling. (-) Back pain.   Dermatologic: (-) rashes. (-) pruritus.   Psychiatric: (-) Depression. (-) anxiety. (-) insomnia.   Neurologic: (-) headaches. (-) neuropathy. (-) weakness. (-) memory problems.              Objective:     BP 124/78   Pulse 77   Temp 36.8 C (98.3 F)   Resp 16   Ht 1.753 m (5' 9.02")   Wt 97.5 kg (215 lb)   SpO2 98%   BMI 31.74 kg/m         General appearance: alert, oriented x 3, in his normal state, cooperative, not in apparent distress, appearing stated age   Integumentary:   Overall examination of the patient's skin reveals- no rashes, no suspicious lesions, and no bruises. Normal coloration of skin. Normal skin moisture.  Head and Neck:  Normocephalic and atraumatic. No lesions or palpable masses. Neck is supple with full ROM. No bruit auscultated on the left or right. No nucchal rigidity. No lymphadenopathy. Thyroid:  Normal size and consistency, no palpable nodules, normal position and symmetric. Non tender.   Lungs: Respirations are non labored. Patient has no conversational dyspnea. Lungs are clear to ausculation .    Heart: regular rate and rhythm, S1, S2 normal, no murmur  Abdomen: soft, non-tender. Bowel sounds normal. No abnormal pulsations. No rigidity and no hepatosplenomegaly . Normal active bowel sounds in all quadrants.   Extremities: extremities normal, atraumatic, no cyanosis or edema, pulses intact in  upper and lower extremities.  No varicose ulcers.      Psych :  Patient is alert and oriented and cooperative with the exam. No evidence of hallucinations, delusions or homicidal/suicidal ideations.   Neuro:  Cranial Nerves  2-12 are grossly intact . Coordination is normal . Gait is normal.  No meningeal signs.     Assessment :       ICD-10-CM    1. Type 2 diabetes mellitus with hyperosmolarity without coma, without long-term current use of insulin (CMS HCC)  E11.00 POCT HGB A1C     COMPREHENSIVE METABOLIC PNL, FASTING     LIPID PANEL     MICROALBUMIN/CREATININE RATIO, URINE, RANDOM    a1c 7.6% 12-06-20      2. Essential hypertension  I10       3. Coronary artery disease involving native coronary artery without angina pectoris  I25.10       4. Pure hypercholesterolemia  E78.00       5. Gastroesophageal reflux disease without esophagitis  K21.9       6. Psoriatic arthritis (CMS HCC)  L40.50       7. Screening PSA (prostate specific antigen)  Z12.5     12-06-20 result 0.2      8. Screen for colon cancer  Z12.11     10-23-16            PLAN :   Diabetes Monitors  A1C: 8.7  A1C Date: 10/02/2021           Nephropathy Screening: On ACEI or ARB    Last Lipid Panel  (Last result in the past 2 years)      Cholesterol   HDL   LDL   Direct LDL   Triglycerides      12/06/20 0848 113   54     46   164          Retinal Exam Date: 12/12/2020  Last diabetic foot exam: Not Found    His blood sugars little high.  He says he has not been able to exercise recently because of work.  He says he has also been traveling and out of town and has eat lot of fast food.  He said that that is going to stop soon he also has get back to doing the gym.  I was going to add metformin but he said that caused him some diarrhea in the past.  He said he would really prefer to stay on has glipizide for now and try to get his diabetes under control with diet exercise told.  I told him that would be fine will recheck in 3 months.      Continue current treatment regimen. Patient is doing well. Patient is to call with any problems prior to next appointment if needed.    The patient was asked to return for fasting labs. I have put orders in the EPIC system today . They are aware that  I will contact them about the results of their labs within 2-3 days of them having them done. If they do not hear from me, they are to call this office for results.     He follows with Urology.  He is up-to-date on his PSA test.      His like he is due for screening colonoscopy this year.  He said he saw a surgeon closer to home and they told him that he was not due for couple years.  Per his last  colonoscopy report looks like he was due in 2020.  I told him if he wants to find the name of the surgeon he saw their I would be glad doing a referral for him.  He agrees and will get me the name.      His blood pressure is controlled.  Will continue current medications.      He has a history of coronary artery disease.  Has been asymptomatic.  Will continue current medications.      He follows with rheumatology for his psoriatic arthritis.  Is on Humira and is doing well    Will see him back in 3 months sooner if needed            No follow-ups on file.           PHQ Questionnaire  Little interest or pleasure in doing things.: Not at all  Feeling down, depressed, or hopeless: Not at all  PHQ 2 Total: 0  Trouble falling or staying asleep, or sleeping too much.: Not at all  Feeling tired or having little energy: Not at all  Poor appetite or overeating: Not at all  Feeling bad about yourself/ that you are a failure in the past 2 weeks?: Not at all  Trouble concentrating on things in the past 2 weeks?: Not at all  Moving/Speaking slowly or being fidgety or restless  in the past 2 weeks?: Not at all  Thoughts that you would be better off DEAD, or of hurting yourself in some way.: Not at all  PHQ 9 Total: 0  Interpretation of Total Score: 0-4 No depression      The patient was given ample opportunity to ask questions and those questions were answered to the patient's satisfaction. The patient was encouraged to be involved in their own care, and all diagnoses, medications, and medication side-effects were discussed.  A copy of  the patient's medication list was printed and given to the patient. A good faith effort was made to reconcile the patient's medications.  The patient is aware that they are to contact me with any additional questions or concerns, or go to the ED in an emergency.     Future Appointments   Date Time Provider [place]   11/03/2021  3:30 PM Stephens November [place], [place] [employer] [address]   12/12/2021  9:00 AM Genia Harold, PA-C [place] Maintenance Due   Topic Date Due   . Covid-19 Vaccine (1) Never done   . HIV Screening  Never done   . Adult Tdap-Td (1 - Tdap) Never done   . Shingles Vaccine (1 of 2) Never done   . Pneumococcal Vaccine, Age 63-64 (2 - PCV) 10/31/2005   . Influenza Vaccine (1) 07/17/2021     Health Maintenance   Topic Date Due   . Covid-19 Vaccine (1) Never done   . HIV Screening  Never done   . Adult Tdap-Td (1 - Tdap) Never done   . Shingles Vaccine (1 of 2) Never done   . Pneumococcal Vaccine, Age 63-64 (2 - PCV) 10/31/2005   . Influenza Vaccine (1) 07/17/2021   . Colonoscopy  10/23/2021   . Diabetic Retinal Exam  12/12/2021   . Diabetes A1C  04/01/2022   . Depression Screening  10/02/2022   . Prostate Cancer Screening  12/06/2022   . Meningococcal Vaccine  Aged Out  This note was partially generated using M[employer] Fluency Direct system, and there may be some incorrect words, spellings, and punctuation that were not noted in checking the note before saving.    Dwyane Luo, DO    [facility]  [address] [place] [address]  Dept Phone: [phone]  Dept Fax: [phone]

## 2021-10-04 DIAGNOSIS — M545 Low back pain, unspecified: Secondary | ICD-10-CM | POA: Diagnosis not present

## 2021-10-06 DIAGNOSIS — M545 Low back pain, unspecified: Secondary | ICD-10-CM | POA: Diagnosis not present

## 2021-10-07 DIAGNOSIS — M545 Low back pain, unspecified: Secondary | ICD-10-CM | POA: Diagnosis not present

## 2021-10-07 DIAGNOSIS — M5416 Radiculopathy, lumbar region: Secondary | ICD-10-CM | POA: Diagnosis not present

## 2021-10-07 DIAGNOSIS — M459 Ankylosing spondylitis of unspecified sites in spine: Secondary | ICD-10-CM | POA: Diagnosis not present

## 2021-10-07 DIAGNOSIS — M6281 Muscle weakness (generalized): Secondary | ICD-10-CM | POA: Diagnosis not present

## 2021-10-07 DIAGNOSIS — M5442 Lumbago with sciatica, left side: Secondary | ICD-10-CM | POA: Diagnosis not present

## 2021-10-13 DIAGNOSIS — M545 Low back pain, unspecified: Secondary | ICD-10-CM | POA: Diagnosis not present

## 2021-10-13 DIAGNOSIS — M5416 Radiculopathy, lumbar region: Secondary | ICD-10-CM | POA: Diagnosis not present

## 2021-10-13 DIAGNOSIS — M5442 Lumbago with sciatica, left side: Secondary | ICD-10-CM | POA: Diagnosis not present

## 2021-10-13 DIAGNOSIS — M6281 Muscle weakness (generalized): Secondary | ICD-10-CM | POA: Diagnosis not present

## 2021-10-14 DIAGNOSIS — M549 Dorsalgia, unspecified: Secondary | ICD-10-CM | POA: Diagnosis not present

## 2021-10-14 DIAGNOSIS — M459 Ankylosing spondylitis of unspecified sites in spine: Secondary | ICD-10-CM | POA: Diagnosis not present

## 2021-10-14 DIAGNOSIS — E1169 Type 2 diabetes mellitus with other specified complication: Secondary | ICD-10-CM | POA: Diagnosis not present

## 2021-10-14 DIAGNOSIS — E785 Hyperlipidemia, unspecified: Secondary | ICD-10-CM | POA: Diagnosis not present

## 2021-10-14 DIAGNOSIS — M461 Sacroiliitis, not elsewhere classified: Secondary | ICD-10-CM | POA: Diagnosis not present

## 2021-10-14 DIAGNOSIS — M4696 Unspecified inflammatory spondylopathy, lumbar region: Secondary | ICD-10-CM | POA: Diagnosis not present

## 2021-10-14 DIAGNOSIS — M5416 Radiculopathy, lumbar region: Secondary | ICD-10-CM | POA: Diagnosis not present

## 2021-10-14 DIAGNOSIS — M545 Low back pain, unspecified: Secondary | ICD-10-CM | POA: Diagnosis not present

## 2021-10-21 ENCOUNTER — Other Ambulatory Visit: Payer: Self-pay | Admitting: Orthopedic Surgery

## 2021-10-21 DIAGNOSIS — M545 Low back pain, unspecified: Secondary | ICD-10-CM

## 2021-10-30 DIAGNOSIS — M545 Low back pain, unspecified: Secondary | ICD-10-CM | POA: Diagnosis not present

## 2021-10-30 DIAGNOSIS — M5116 Intervertebral disc disorders with radiculopathy, lumbar region: Secondary | ICD-10-CM | POA: Diagnosis not present

## 2021-10-30 DIAGNOSIS — M4727 Other spondylosis with radiculopathy, lumbosacral region: Secondary | ICD-10-CM | POA: Diagnosis not present

## 2021-10-30 DIAGNOSIS — M4726 Other spondylosis with radiculopathy, lumbar region: Secondary | ICD-10-CM | POA: Diagnosis not present

## 2021-10-30 DIAGNOSIS — M5117 Intervertebral disc disorders with radiculopathy, lumbosacral region: Secondary | ICD-10-CM | POA: Diagnosis not present

## 2021-11-01 ENCOUNTER — Other Ambulatory Visit: Payer: Self-pay

## 2021-11-03 ENCOUNTER — Ambulatory Visit: Payer: BC Managed Care – PPO | Attending: Surgical | Admitting: Surgical

## 2021-11-03 ENCOUNTER — Encounter (HOSPITAL_BASED_OUTPATIENT_CLINIC_OR_DEPARTMENT_OTHER): Payer: Self-pay | Admitting: Surgical

## 2021-11-03 VITALS — BP 110/70 | HR 89 | Ht 69.0 in | Wt 210.3 lb

## 2021-11-03 DIAGNOSIS — M5416 Radiculopathy, lumbar region: Secondary | ICD-10-CM | POA: Diagnosis not present

## 2021-11-03 DIAGNOSIS — Z79899 Other long term (current) drug therapy: Secondary | ICD-10-CM | POA: Insufficient documentation

## 2021-11-03 DIAGNOSIS — L4050 Arthropathic psoriasis, unspecified: Secondary | ICD-10-CM | POA: Insufficient documentation

## 2021-11-03 NOTE — Progress Notes (Signed)
Subjective  Chase [name] is a 55 y.o. year old male who presents to clinic for Psoriatic Arthritis follow up. He was last seen on 01/17/21. At that time, he was continued on Humira 40 mg SQ Q14d.    Patient states that he has remained stable since last visit. He continues to endorse pain in his lower back, BL knees (R>L), and BL ankles with occasional swelling observed to the R knee and both ankles. Has morning stiffness lasting about 30-45 minutes. His pain is typically worsened by activity as he has a very active job that requires bending and lifting. He tried Voltaren gel but states that it offered little to no improvement to his symptoms.      He continues to take Humira as prescribed and feels that the medication has effectively controlled his symptoms. He has not had a psoriasis flare in several years. He denies hot, swollen, or painful joints. Overall, he feels that he has more good days than bad and is satisfied with his current medication regimen.    ROS:  All other systems reviewed in detail are negative, except as noted.    Current Outpatient Medications   Medication Sig    amLODIPine (NORVASC) 10 mg Oral Tablet Take 1 Tablet (10 mg total) by mouth Once a day    azelastine (ASTELIN) 137 mcg (0.1 %) Nasal Aerosol, Spray Administer 2 Sprays into each nostril Twice daily Use in each nostril as directed    Blood Sugar Diagnostic (ONETOUCH VERIO) Strip 1 Strip by In Vitro route Three times a day    Blood-Glucose Meter (ONETOUCH VERIO IQ METER) Misc 1 Each by In Vitro route Three times a day    enalapril (VASOTEC) 20 mg Oral Tablet Take 1 Tablet (20 mg total) by mouth Once a day    glipiZIDE (GLUCOTROL XL) 10 mg Oral Tablet Extended Rel 24 hr Take 1 Tablet (10 mg total) by mouth Every morning with breakfast    HUMIRA,CF, PEN 40 mg/0.4 mL Subcutaneous Pen Injector Kit INJECT 40 MG UNDER THE SKIN EVERY 14 DAYS    lancets (ONETOUCH SURESOFT LANCING DEV) 28 gauge Misc 1 Each by In Vitro route Three times  a day    metoprolol succinate (TOPROL-XL) 50 mg Oral Tablet Sustained Release 24 hr Take 1 Tablet (50 mg total) by mouth Once a day    rosuvastatin (CRESTOR) 20 mg Oral Tablet Take 1 Tablet (20 mg total) by mouth Every evening    Tadalafil (CIALIS) 5 mg Oral Tablet TAKE AS DIRECTED BY PRESCRIBER OR PACKAGE INSTRUCTIONS     Objective  BP 110/70    Pulse 89    Ht 1.753 m (5' 9" )    Wt 95.4 kg (210 lb 5.1 oz)    SpO2 97%    BMI 31.06 kg/m   General:No acute distress. Appears stated age.  Eyes: Conjunctiva clear. No scleral icterus  Respiratory: Normal pulmonary effort. Lung sounds clear to auscultation bilaterally  Cardiovascular: Regular rate and rhythm. No murmurs, gallops, or rubs.  MBP:JPETK fullness of left 5th PIP, no synovitis, normal ROM of knees and ankles, no swelling or pain on palpation, grip strength fully intact  Skin: No facial rash, no psoriasis.    Assessment/Plan  1. Psoriatic arthritis (CMS [place])    2. Long term current use of immunosuppressive drug      Chase [name] is a 55 y.o. male here for Psoriatic Arthritis follow up. Currently on Humira Q14d which he has been taking since  2013. Stable disease activity with no active synovitis on exam.    Plan:  -Continue Humira as prescribed. Discussed with the patient that we can consider alternative agents in the future if his symptoms worsen  -Patient due for medication monitoring labs. Will add to labwork previously ordered by PCP.    RTC in 1 year for F/U; or sooner, if needed.    No orders of the defined types were placed in this encounter.    Danelle Earthly, STUDENT PHYSICIAN ASSISTANT  11/03/2021, 16:08    I was present when the student was taking history, performing the exam, and during any medical decision making activities. I personally verified the history, performed the exam, and medical decision making as edited in the students note. I agree with the physician assistant students note.       [place] PA-C  [facility]  Medicine  Rheumatology

## 2021-11-04 ENCOUNTER — Other Ambulatory Visit: Payer: BC Managed Care – PPO

## 2021-11-05 DIAGNOSIS — M5416 Radiculopathy, lumbar region: Secondary | ICD-10-CM | POA: Diagnosis not present

## 2021-11-05 DIAGNOSIS — M6281 Muscle weakness (generalized): Secondary | ICD-10-CM | POA: Diagnosis not present

## 2021-11-05 DIAGNOSIS — M545 Low back pain, unspecified: Secondary | ICD-10-CM | POA: Diagnosis not present

## 2021-11-11 DIAGNOSIS — M6281 Muscle weakness (generalized): Secondary | ICD-10-CM | POA: Diagnosis not present

## 2021-11-11 DIAGNOSIS — M5416 Radiculopathy, lumbar region: Secondary | ICD-10-CM | POA: Diagnosis not present

## 2021-11-11 DIAGNOSIS — M545 Low back pain, unspecified: Secondary | ICD-10-CM | POA: Diagnosis not present

## 2021-11-13 DIAGNOSIS — M5416 Radiculopathy, lumbar region: Secondary | ICD-10-CM | POA: Diagnosis not present

## 2021-11-13 DIAGNOSIS — M545 Low back pain, unspecified: Secondary | ICD-10-CM | POA: Diagnosis not present

## 2021-11-13 DIAGNOSIS — M6281 Muscle weakness (generalized): Secondary | ICD-10-CM | POA: Diagnosis not present

## 2021-11-20 DIAGNOSIS — M6281 Muscle weakness (generalized): Secondary | ICD-10-CM | POA: Diagnosis not present

## 2021-11-20 DIAGNOSIS — M5416 Radiculopathy, lumbar region: Secondary | ICD-10-CM | POA: Diagnosis not present

## 2021-11-20 DIAGNOSIS — M545 Low back pain, unspecified: Secondary | ICD-10-CM | POA: Diagnosis not present

## 2021-11-25 DIAGNOSIS — E1169 Type 2 diabetes mellitus with other specified complication: Secondary | ICD-10-CM | POA: Diagnosis not present

## 2021-11-25 DIAGNOSIS — E785 Hyperlipidemia, unspecified: Secondary | ICD-10-CM | POA: Diagnosis not present

## 2021-11-25 DIAGNOSIS — M459 Ankylosing spondylitis of unspecified sites in spine: Secondary | ICD-10-CM | POA: Diagnosis not present

## 2021-11-25 DIAGNOSIS — M461 Sacroiliitis, not elsewhere classified: Secondary | ICD-10-CM | POA: Diagnosis not present

## 2021-12-01 ENCOUNTER — Telehealth (HOSPITAL_BASED_OUTPATIENT_CLINIC_OR_DEPARTMENT_OTHER): Payer: Self-pay | Admitting: Medical

## 2021-12-01 DIAGNOSIS — R350 Frequency of micturition: Secondary | ICD-10-CM

## 2021-12-01 DIAGNOSIS — N400 Enlarged prostate without lower urinary tract symptoms: Secondary | ICD-10-CM

## 2021-12-01 DIAGNOSIS — Z87442 Personal history of urinary calculi: Secondary | ICD-10-CM

## 2021-12-01 DIAGNOSIS — R3915 Urgency of urination: Secondary | ICD-10-CM

## 2021-12-01 NOTE — Telephone Encounter (Signed)
Patient is scheduled for a follow up on 01/27. Patient needs a KUB and PSA prior to the follow up. Please schedule a lab visit for the patient. Orders are in [facility].     Wilfrid Lund, MA  12/01/2021, 13:18

## 2021-12-02 NOTE — Telephone Encounter (Signed)
LM

## 2021-12-03 NOTE — Telephone Encounter (Signed)
Pt notified needing PSA, pt states he has to get blood Saturday for PCP at another [facility] medicine facility and will do it then.

## 2021-12-04 DIAGNOSIS — M5416 Radiculopathy, lumbar region: Secondary | ICD-10-CM | POA: Diagnosis not present

## 2021-12-04 DIAGNOSIS — M545 Low back pain, unspecified: Secondary | ICD-10-CM | POA: Diagnosis not present

## 2021-12-04 DIAGNOSIS — M6281 Muscle weakness (generalized): Secondary | ICD-10-CM | POA: Diagnosis not present

## 2021-12-05 DIAGNOSIS — M545 Low back pain, unspecified: Secondary | ICD-10-CM | POA: Diagnosis not present

## 2021-12-06 ENCOUNTER — Other Ambulatory Visit: Payer: Self-pay

## 2021-12-06 ENCOUNTER — Other Ambulatory Visit: Payer: BC Managed Care – PPO | Attending: Family Medicine

## 2021-12-06 DIAGNOSIS — L4050 Arthropathic psoriasis, unspecified: Secondary | ICD-10-CM

## 2021-12-06 DIAGNOSIS — E1100 Type 2 diabetes mellitus with hyperosmolarity without nonketotic hyperglycemic-hyperosmolar coma (NKHHC): Secondary | ICD-10-CM | POA: Insufficient documentation

## 2021-12-06 DIAGNOSIS — R350 Frequency of micturition: Secondary | ICD-10-CM | POA: Insufficient documentation

## 2021-12-06 DIAGNOSIS — Z79899 Other long term (current) drug therapy: Secondary | ICD-10-CM | POA: Insufficient documentation

## 2021-12-06 DIAGNOSIS — Z87442 Personal history of urinary calculi: Secondary | ICD-10-CM

## 2021-12-06 DIAGNOSIS — N400 Enlarged prostate without lower urinary tract symptoms: Secondary | ICD-10-CM | POA: Insufficient documentation

## 2021-12-06 DIAGNOSIS — R3915 Urgency of urination: Secondary | ICD-10-CM | POA: Insufficient documentation

## 2021-12-06 LAB — COMPREHENSIVE METABOLIC PNL, FASTING
ALBUMIN: 4.1 g/dL (ref 3.5–5.0)
ALKALINE PHOSPHATASE: 58 U/L (ref 45–115)
ALT (SGPT): 31 U/L (ref 10–55)
ANION GAP: 8 mmol/L (ref 4–13)
AST (SGOT): 18 U/L (ref 8–45)
BILIRUBIN TOTAL: 0.7 mg/dL (ref 0.3–1.3)
BUN/CREA RATIO: 17 (ref 6–22)
BUN: 18 mg/dL (ref 8–25)
CALCIUM: 9.3 mg/dL (ref 8.5–10.0)
CHLORIDE: 106 mmol/L (ref 96–111)
CO2 TOTAL: 25 mmol/L (ref 22–30)
CREATININE: 1.05 mg/dL (ref 0.75–1.35)
ESTIMATED GFR: 83 mL/min/BSA (ref >=60)
GLUCOSE: 147 mg/dL — ABNORMAL HIGH (ref 70–99)
POTASSIUM: 4.7 mmol/L (ref 3.5–5.1)
PROTEIN TOTAL: 7.4 g/dL (ref 6.0–7.9)
SODIUM: 139 mmol/L (ref 136–145)

## 2021-12-06 LAB — LIPID PANEL
CHOL/HDL RATIO: 2.3
CHOLESTEROL: 110 mg/dL (ref 100–200)
HDL CHOL: 48 mg/dL — ABNORMAL LOW (ref >=50)
LDL CALC: 43 mg/dL (ref <100)
NON-HDL: 62 mg/dL (ref <=190)
TRIGLYCERIDES: 97 mg/dL (ref <150)
VLDL CALC: 19 mg/dL (ref <30)

## 2021-12-06 LAB — CBC WITH DIFF
BASOPHIL %: 1 % (ref 0–3)
EOSINOPHIL %: 4 % (ref 0–7)
HCT: 44.9 % (ref 43.5–53.7)
HGB & HCT check: 0.7
HGB: 15.2 g/dL (ref 14.1–18.1)
LYMPHOCYTE %: 37 % (ref 10–50)
MCH: 29.3 pg (ref 27.0–31.2)
MCHC: 34 g/dL (ref 31.8–35.4)
MCV: 86.4 fL (ref 80.0–97.0)
MONOCYTE %: 10 % (ref 0–12)
NEUTROPHIL %: 48 % (ref 37–80)
PLATELETS: 236 10*3/uL (ref 142–424)
RBC: 5.19 10*6/uL (ref 4.69–6.13)
RDW: 13.8 % (ref 11.6–14.9)
WBC: 5.5 10*3/uL (ref 4.6–10.2)

## 2021-12-06 LAB — PSA, DIAGNOSTIC: PSA: 0.29 ng/mL (ref <=4.00)

## 2021-12-06 LAB — MICROALBUMIN/CREATININE RATIO, URINE, RANDOM
CREATININE RANDOM URINE: 153 mg/dL — ABNORMAL HIGH (ref 50–100)
MICROALBUMIN RANDOM URINE: 0.6 mg/dL
MICROALBUMIN/CREATININE RATIO RANDOM URINE: 3.9 mg/g (ref <30.0)

## 2021-12-07 ENCOUNTER — Other Ambulatory Visit (INDEPENDENT_AMBULATORY_CARE_PROVIDER_SITE_OTHER): Payer: Self-pay | Admitting: Family Medicine

## 2021-12-07 DIAGNOSIS — E1100 Type 2 diabetes mellitus with hyperosmolarity without nonketotic hyperglycemic-hyperosmolar coma (NKHHC): Secondary | ICD-10-CM

## 2021-12-07 DIAGNOSIS — I10 Essential (primary) hypertension: Secondary | ICD-10-CM

## 2021-12-08 DIAGNOSIS — Z6841 Body Mass Index (BMI) 40.0 and over, adult: Secondary | ICD-10-CM | POA: Diagnosis not present

## 2021-12-08 DIAGNOSIS — M47816 Spondylosis without myelopathy or radiculopathy, lumbar region: Secondary | ICD-10-CM | POA: Diagnosis not present

## 2021-12-12 ENCOUNTER — Ambulatory Visit: Payer: BC Managed Care – PPO | Attending: Medical | Admitting: Medical

## 2021-12-12 ENCOUNTER — Inpatient Hospital Stay (HOSPITAL_BASED_OUTPATIENT_CLINIC_OR_DEPARTMENT_OTHER)
Admission: RE | Admit: 2021-12-12 | Discharge: 2021-12-12 | Disposition: A | Discharge status: Home / Self Care | Payer: BC Managed Care – PPO | Source: Ambulatory Visit

## 2021-12-12 ENCOUNTER — Encounter (HOSPITAL_BASED_OUTPATIENT_CLINIC_OR_DEPARTMENT_OTHER): Payer: Self-pay | Admitting: Medical

## 2021-12-12 ENCOUNTER — Other Ambulatory Visit: Payer: Self-pay

## 2021-12-12 DIAGNOSIS — R350 Frequency of micturition: Secondary | ICD-10-CM | POA: Insufficient documentation

## 2021-12-12 DIAGNOSIS — N401 Enlarged prostate with lower urinary tract symptoms: Secondary | ICD-10-CM

## 2021-12-12 DIAGNOSIS — Z87442 Personal history of urinary calculi: Secondary | ICD-10-CM | POA: Insufficient documentation

## 2021-12-12 DIAGNOSIS — R3915 Urgency of urination: Secondary | ICD-10-CM | POA: Insufficient documentation

## 2021-12-12 DIAGNOSIS — N400 Enlarged prostate without lower urinary tract symptoms: Secondary | ICD-10-CM | POA: Insufficient documentation

## 2021-12-12 LAB — URINALYSIS, MACROSCOPIC
BILIRUBIN: NEGATIVE mg/dL
BLOOD: NEGATIVE mg/dL
COLOR: NORMAL
GLUCOSE: 300 mg/dL — AB
KETONES: NEGATIVE mg/dL
LEUKOCYTES: NEGATIVE WBCs/uL
NITRITE: NEGATIVE
PH: 5.5 (ref 5.0–8.0)
PROTEIN: NEGATIVE mg/dL
SPECIFIC GRAVITY: 1.024 (ref 1.005–1.030)
UROBILINOGEN: NORMAL mg/dL

## 2021-12-12 LAB — URINALYSIS, MICROSCOPIC

## 2021-12-12 LAB — POCT PVR

## 2021-12-12 NOTE — H&P (Signed)
[facility]  [place] [address]  Dept: [phone]  Dept Fax: [phone]      Follow up H&P    12/12/2021    Chase [name]  Date of Birth. Jan 24, 1966  MRN: [id number]  Referring Physician:  No ref. provider found      Chief Complaint   Patient presents with   . Other     One year follow up         History of present illness:55 y.o. male that was last evaluated 12/06/20. He is taking Cialis 5 mg daily. He is voiding during the day every 3-4 hours with a forceful stream, nocturia 0, he denies urgency,  dysuria, gross hematuria or urinary incontinence. The post void residual today in office is 0 cc.    PROSTATE SPECIFIC ANTIGEN   Lab Results   Component Value Date/Time    PROSSPECAG 0.29 12/06/2021 08:59 AM          PMH:   Past Medical History:   Diagnosis Date   . Arthritis with psoriasis (CMS HCC)    . Arthropathy     psoriatic   . CAD (coronary artery disease)    . Diabetes mellitus, type 2 (CMS [place]) 03/19/2009   . Fracture of distal end of tibia     left   . Fracture of distal fibula     left   . GERD (gastroesophageal reflux disease)    . Hypertension    . Kidney stones    . Right shoulder pain    . Type 2 diabetes mellitus (CMS HCC)    . Urinary calculus, unspecified     Renal stones         Past Surgical History:   Procedure Laterality Date   . HX APPENDECTOMY      2017   . HX CARPAL TUNNEL RELEASE  1990    bil   . HX HEART CATHETERIZATION  11/09   . [place]    2010   . LITHOTRIPSY  2004   . PB LAP UMBILICAL HERNIA REPAIR  2009         Current Outpatient Medications   Medication Sig   . amLODIPine (NORVASC) 10 mg Oral Tablet Take 1 Tablet (10 mg total) by mouth Once a day   . azelastine (ASTELIN) 137 mcg (0.1 %) Nasal Aerosol, Spray Administer 2 Sprays into each nostril Twice daily Use in each nostril as directed (Patient not taking: Reported on 12/12/2021)   . Blood Sugar Diagnostic (ONETOUCH VERIO) Strip 1 Strip by In Vitro route Three times a day   . Blood-Glucose  Meter (ONETOUCH VERIO IQ METER) Misc 1 Each by In Vitro route Three times a day   . enalapril (VASOTEC) 20 mg Oral Tablet Take 1 Tablet (20 mg total) by mouth Once a day   . glipiZIDE (GLUCOTROL XL) 10 mg Oral Tablet Extended Rel 24 hr Take 1 Tablet (10 mg total) by mouth Every morning with breakfast   . HUMIRA,CF, PEN 40 mg/0.4 mL Subcutaneous Pen Injector Kit INJECT 40 MG UNDER THE SKIN EVERY 14 DAYS   . lancets (ONETOUCH SURESOFT LANCING DEV) 28 gauge Misc 1 Each by In Vitro route Three times a day   . metoprolol succinate (TOPROL-XL) 50 mg Oral Tablet Sustained Release 24 hr Take 1 Tablet (50 mg total) by mouth Once a day   . rosuvastatin (CRESTOR) 20 mg Oral Tablet Take 1 Tablet (20 mg total) by  mouth Every evening   . Tadalafil (CIALIS) 5 mg Oral Tablet TAKE AS DIRECTED BY PRESCRIBER OR PACKAGE INSTRUCTIONS     No Known Allergies  Social History     Socioeconomic History   . Marital status: Married     Spouse name: Not on file   . Number of children: Not on file   . Years of education: Not on file   . Highest education level: Not on file   Occupational History   . Not on file   Tobacco Use   . Smoking status: Former     Types: Cigars   . Smokeless tobacco: Former     Types: Snuff     Quit date: 10/26/2019   . Tobacco comments:     3-4 a week   Substance and Sexual Activity   . Alcohol use: Yes     Comment: occassionally   . Drug use: No   . Sexual activity: Not on file   Other Topics Concern   . Ability to Walk 1 Flight of Steps without SOB/CP Not Asked   . Routine Exercise Not Asked   . Ability to Walk 2 Flight of Steps without SOB/CP Not Asked   . Unable to Ambulate Not Asked   . Total Care Not Asked   . Ability To Do Own ADL's Not Asked   . Uses Walker Not Asked   . Other Activity Level Not Asked   . Uses Cane Not Asked   Social History Narrative   . Not on file     Social Determinants of Health     Financial Resource Strain: Not on file   Food Insecurity: Not on file   Transportation Needs: Not on file    Physical Activity: Not on file   Stress: Not on file   Intimate Partner Violence: Not on file   Housing Stability: Not on file     Family Medical History:     Problem Relation (Age of Onset)    Cancer Father    Diabetes Mother, Father    Hypertension (High Blood Pressure) Mother            Review of systems:    As per history of present illness or negative for constitutional symptoms, ENT, cardiovascular, respiratory, gastrointestinal, genitourinary; musculoskeletal, skin and/or breasts, neurological, psychiatric, endocrine, hematologic, lymphatic, and allergic immunologic. Also, as per urology H&P forms scanned into the computer.    Physical examination:    There were no vitals taken for this visit.       General: Well-developed, well-nourished male in no apparent distress.  HEENT: Grossly normal.  Chest: Normal excursion.  Abdomen: Soft, without tenderness or mass.  GU examination: DRE: soft smooth gland  Skin: Warm and dry.  Extremities: Moves all extremities.  Neurologic: Alert and oriented in three spheres.    Labs:     Lab Results   Component Value Date/Time    COLOR Normal (Yellow) 12/06/2020 10:01 AM    SPECGRAVUR 1.023 12/06/2020 10:01 AM    PHURINE 6.5 12/06/2020 10:01 AM    PROTEIN Negative 12/06/2020 10:01 AM    GLUCOSE 50 (A) 12/06/2020 10:01 AM    KETONES Negative 12/06/2020 10:01 AM    UROBILINOGEN Normal 12/06/2020 10:01 AM    LEUKOCYTES Negative 12/06/2020 10:01 AM    NITRITE Negative 12/06/2020 10:01 AM    WBC 5.5 12/06/2021 08:59 AM    RBC 5.19 12/06/2021 08:59 AM    BACTERIA None 08/24/2019 01:24 PM  MUCOUS Rare 12/06/2020 10:01 AM    BILIRUBIN Negative 12/06/2020 10:01 AM    BLOOD neg 08/24/2019 12:00 AM    HGBURINE Negative 12/06/2020 10:01 AM    SQUAEPIT None 08/24/2019 01:24 PM         Results for orders placed or performed in visit on 12/12/21 (from the past 72 hour(s))   POCT PVR    Collection Time: 12/12/21 12:00 AM   Result Value Ref Range    URINE TOTAL VOLUME 045m        Recent  Labs     11/25/18  1122 12/06/20  0848 12/06/21  0859   PSA 0.3 0.2 0.29         Lab Results   Component Value Date    CREATININE 1.05 12/06/2021         Radiographic data:      No results found for this or any previous visit (from the past [id number](s)).  Results for orders placed during the hospital encounter of 12/12/21    XR KUB    Narrative  Izear L Cadenas    PROCEDURE DESCRIPTION: XR KUB    CLINICAL INDICATION: Z87.442: History of kidney stones  N40.0: Benign prostatic hyperplasia, unspecified whether lower urinary tract symptoms present  R35.0: Frequency of urination  R39.15: Urgency of urination    TECHNIQUE: 1 views / 2 images submitted.    COMPARISON: 12/06/2020      FINDINGS: 2 supine views of the abdomen show a moderate amount of stool seen throughout the colon. The renal outlines are partially obscured by bowel gas and stool. However no definite renal or ureteral stones are seen. There is a small stable round calcification in the right hemipelvis which has the appearance of phleboliths.    Impression  No definite renal or ureteral stones are seen on today's exam.                Radiologist location ID: [id number]     Procedures:    PVR=0 cc    Impression:    1. History of kidney stones    2. Benign prostatic hyperplasia, unspecified whether lower urinary tract symptoms present    3. Frequency of urination              Plan:    No follow-ups on file.    1.Patient instructed to call office if any problems or changes in condition.  2. UA/CS today  3. Cialis 5 mg daily  4. RTO 1 year with a PSA prior        DVICK FILTERwas encouraged to use [facility] to access test results. Our office will contact the patient directly if any concerning abnormalities.    Orders Placed This Encounter   . URINE CULTURE   . URINALYSIS, MACROSCOPIC AND MICROSCOPIC   . URINALYSIS, MACROSCOPIC   . URINALYSIS, MICROSCOPIC   . POCT PVR         [facility]ia Harold [facility]

## 2021-12-13 LAB — URINE CULTURE: URINE CULTURE: NO GROWTH

## 2021-12-23 DIAGNOSIS — M4696 Unspecified inflammatory spondylopathy, lumbar region: Secondary | ICD-10-CM | POA: Diagnosis not present

## 2021-12-23 DIAGNOSIS — M545 Low back pain, unspecified: Secondary | ICD-10-CM | POA: Diagnosis not present

## 2022-01-02 ENCOUNTER — Encounter (INDEPENDENT_AMBULATORY_CARE_PROVIDER_SITE_OTHER): Payer: Self-pay | Admitting: Family Medicine

## 2022-01-15 DIAGNOSIS — E1169 Type 2 diabetes mellitus with other specified complication: Secondary | ICD-10-CM | POA: Diagnosis not present

## 2022-01-15 DIAGNOSIS — Z125 Encounter for screening for malignant neoplasm of prostate: Secondary | ICD-10-CM | POA: Diagnosis not present

## 2022-01-15 DIAGNOSIS — E7800 Pure hypercholesterolemia, unspecified: Secondary | ICD-10-CM | POA: Diagnosis not present

## 2022-01-15 DIAGNOSIS — Z7984 Long term (current) use of oral hypoglycemic drugs: Secondary | ICD-10-CM | POA: Diagnosis not present

## 2022-01-19 DIAGNOSIS — E7800 Pure hypercholesterolemia, unspecified: Secondary | ICD-10-CM | POA: Diagnosis not present

## 2022-01-19 DIAGNOSIS — I10 Essential (primary) hypertension: Secondary | ICD-10-CM | POA: Diagnosis not present

## 2022-01-19 DIAGNOSIS — Z0000 Encounter for general adult medical examination without abnormal findings: Secondary | ICD-10-CM | POA: Diagnosis not present

## 2022-01-19 DIAGNOSIS — M545 Low back pain, unspecified: Secondary | ICD-10-CM | POA: Diagnosis not present

## 2022-01-19 DIAGNOSIS — E1122 Type 2 diabetes mellitus with diabetic chronic kidney disease: Secondary | ICD-10-CM | POA: Diagnosis not present

## 2022-02-08 ENCOUNTER — Other Ambulatory Visit (INDEPENDENT_AMBULATORY_CARE_PROVIDER_SITE_OTHER): Payer: Self-pay | Admitting: Family Medicine

## 2022-02-08 DIAGNOSIS — E7800 Pure hypercholesterolemia, unspecified: Secondary | ICD-10-CM

## 2022-03-10 DIAGNOSIS — M459 Ankylosing spondylitis of unspecified sites in spine: Secondary | ICD-10-CM | POA: Diagnosis not present

## 2022-03-15 ENCOUNTER — Other Ambulatory Visit (HOSPITAL_BASED_OUTPATIENT_CLINIC_OR_DEPARTMENT_OTHER): Payer: Self-pay | Admitting: Medical

## 2022-03-16 ENCOUNTER — Telehealth (HOSPITAL_BASED_OUTPATIENT_CLINIC_OR_DEPARTMENT_OTHER): Payer: Self-pay | Admitting: Medical

## 2022-03-16 ENCOUNTER — Other Ambulatory Visit (HOSPITAL_BASED_OUTPATIENT_CLINIC_OR_DEPARTMENT_OTHER): Payer: Self-pay | Admitting: Medical

## 2022-03-16 MED ORDER — TADALAFIL 5 MG TABLET
5.0000 mg | ORAL_TABLET | ORAL | 0 refills | Status: DC | PRN
Start: 2022-03-16 — End: 2022-10-15

## 2022-03-16 MED ORDER — TADALAFIL 5 MG TABLET
ORAL_TABLET | ORAL | 2 refills | Status: DC
Start: 2022-03-16 — End: 2022-10-22

## 2022-03-16 NOTE — Telephone Encounter (Signed)
Spoke with pt. And he has not had any MI episodes or taken any NTG. No Cardio issue per pt. [facility] few sent to [facility] because [facility] takes [facility] to [facility].

## 2022-03-16 NOTE — Telephone Encounter (Signed)
Called pt. Left a message. Explaining that we need to ask him a few questions regarding this refill. Has he had any heart issues used [facility]. And clarification if the [facility] is in [facility].

## 2022-03-16 NOTE — Telephone Encounter (Signed)
pt called needing a refill of [facility]    pt is asking if an extra supply can be authorized to [facility] so he has enough in between shipments

## 2022-03-17 NOTE — Telephone Encounter (Signed)
Duplicate, recently sent by [facility], pac

## 2022-03-21 DIAGNOSIS — J018 Other acute sinusitis: Secondary | ICD-10-CM | POA: Diagnosis not present

## 2022-03-31 DIAGNOSIS — M459 Ankylosing spondylitis of unspecified sites in spine: Secondary | ICD-10-CM | POA: Diagnosis not present

## 2022-04-14 DIAGNOSIS — M549 Dorsalgia, unspecified: Secondary | ICD-10-CM | POA: Diagnosis not present

## 2022-04-17 ENCOUNTER — Encounter (INDEPENDENT_AMBULATORY_CARE_PROVIDER_SITE_OTHER): Payer: BC Managed Care – PPO | Admitting: Family Medicine

## 2022-04-17 DIAGNOSIS — Z125 Encounter for screening for malignant neoplasm of prostate: Secondary | ICD-10-CM

## 2022-04-17 DIAGNOSIS — Z1211 Encounter for screening for malignant neoplasm of colon: Secondary | ICD-10-CM

## 2022-04-17 DIAGNOSIS — E1100 Type 2 diabetes mellitus with hyperosmolarity without nonketotic hyperglycemic-hyperosmolar coma (NKHHC): Secondary | ICD-10-CM

## 2022-04-17 DIAGNOSIS — E7800 Pure hypercholesterolemia, unspecified: Secondary | ICD-10-CM

## 2022-04-17 DIAGNOSIS — K219 Gastro-esophageal reflux disease without esophagitis: Secondary | ICD-10-CM

## 2022-04-17 DIAGNOSIS — I10 Essential (primary) hypertension: Secondary | ICD-10-CM

## 2022-04-17 DIAGNOSIS — L4050 Arthropathic psoriasis, unspecified: Secondary | ICD-10-CM

## 2022-04-24 ENCOUNTER — Other Ambulatory Visit (INDEPENDENT_AMBULATORY_CARE_PROVIDER_SITE_OTHER): Payer: Self-pay | Admitting: Family Medicine

## 2022-04-24 DIAGNOSIS — I10 Essential (primary) hypertension: Secondary | ICD-10-CM

## 2022-05-12 DIAGNOSIS — M459 Ankylosing spondylitis of unspecified sites in spine: Secondary | ICD-10-CM | POA: Diagnosis not present

## 2022-05-20 ENCOUNTER — Other Ambulatory Visit: Payer: BC Managed Care – PPO | Attending: Family Medicine | Admitting: Family Medicine

## 2022-05-20 ENCOUNTER — Other Ambulatory Visit: Payer: Self-pay

## 2022-05-20 ENCOUNTER — Ambulatory Visit (INDEPENDENT_AMBULATORY_CARE_PROVIDER_SITE_OTHER): Payer: BC Managed Care – PPO | Admitting: Family Medicine

## 2022-05-20 ENCOUNTER — Other Ambulatory Visit (INDEPENDENT_AMBULATORY_CARE_PROVIDER_SITE_OTHER): Payer: Self-pay

## 2022-05-20 ENCOUNTER — Encounter (INDEPENDENT_AMBULATORY_CARE_PROVIDER_SITE_OTHER): Payer: Self-pay | Admitting: Family Medicine

## 2022-05-20 VITALS — BP 120/60 | HR 65 | Temp 98.5°F | Resp 16 | Ht 69.0 in | Wt 205.0 lb

## 2022-05-20 DIAGNOSIS — L4050 Arthropathic psoriasis, unspecified: Secondary | ICD-10-CM

## 2022-05-20 DIAGNOSIS — I10 Essential (primary) hypertension: Secondary | ICD-10-CM | POA: Insufficient documentation

## 2022-05-20 DIAGNOSIS — E7800 Pure hypercholesterolemia, unspecified: Secondary | ICD-10-CM

## 2022-05-20 DIAGNOSIS — Z1211 Encounter for screening for malignant neoplasm of colon: Secondary | ICD-10-CM

## 2022-05-20 DIAGNOSIS — Z125 Encounter for screening for malignant neoplasm of prostate: Secondary | ICD-10-CM

## 2022-05-20 DIAGNOSIS — L989 Disorder of the skin and subcutaneous tissue, unspecified: Secondary | ICD-10-CM

## 2022-05-20 DIAGNOSIS — E1100 Type 2 diabetes mellitus with hyperosmolarity without nonketotic hyperglycemic-hyperosmolar coma (NKHHC): Secondary | ICD-10-CM

## 2022-05-20 DIAGNOSIS — K219 Gastro-esophageal reflux disease without esophagitis: Secondary | ICD-10-CM

## 2022-05-20 DIAGNOSIS — D126 Benign neoplasm of colon, unspecified: Secondary | ICD-10-CM

## 2022-05-20 LAB — COMPREHENSIVE METABOLIC PNL, FASTING
ALBUMIN: 4.4 g/dL (ref 3.5–5.0)
ALKALINE PHOSPHATASE: 55 U/L (ref 45–115)
ALT (SGPT): 33 U/L (ref 10–55)
ANION GAP: 5 mmol/L (ref 4–13)
AST (SGOT): 22 U/L (ref 8–45)
BILIRUBIN TOTAL: 1 mg/dL (ref 0.3–1.3)
BUN/CREA RATIO: 15 (ref 6–22)
BUN: 17 mg/dL (ref 8–25)
CALCIUM: 10.1 mg/dL — ABNORMAL HIGH (ref 8.5–10.0)
CHLORIDE: 102 mmol/L (ref 96–111)
CO2 TOTAL: 26 mmol/L (ref 22–30)
CREATININE: 1.1 mg/dL (ref 0.75–1.35)
ESTIMATED GFR: 79 mL/min/BSA (ref >=60)
GLUCOSE: 143 mg/dL — ABNORMAL HIGH (ref 70–99)
POTASSIUM: 4.6 mmol/L (ref 3.5–5.1)
PROTEIN TOTAL: 7.9 g/dL (ref 6.4–8.3)
SODIUM: 133 mmol/L — ABNORMAL LOW (ref 136–145)

## 2022-05-20 LAB — LIPID PANEL
CHOL/HDL RATIO: 2.5
CHOLESTEROL: 124 mg/dL (ref 100–200)
HDL CHOL: 49 mg/dL — ABNORMAL LOW (ref >=50)
LDL CALC: 51 mg/dL (ref <100)
NON-HDL: 75 mg/dL (ref <=190)
TRIGLYCERIDES: 139 mg/dL (ref <150)
VLDL CALC: 20 mg/dL (ref <30)

## 2022-05-20 LAB — POCT HGB A1C: POCT HGB A1C: 7.4 % — AB (ref 4–6)

## 2022-05-20 NOTE — Progress Notes (Signed)
[facility]  [address]   [place], [place]  [address]  Dept. Phone : [phone]  Dept. Fax: [phone]          Chase [name]  Sep 19, 1966  [id number]    Date of Service: 05/20/2022 11:00 AM EDT    Chief complaint:   Chief Complaint   Patient presents with   . Follow Up 3 Months     fasting    . Diabetes   . Hypercholesterolemia, Pure       The patient is here for follow up of their chronic conditions.  He has been doing well.  Have any complaints today other than he has an area of the volar surface of his right wrist.  It is red and raised.  Chase [name] it is a little tender at times.  It has been there since March.  He does not remember being stung by anything.   He said that it has gotten smaller over time.    Subjective:     This is a case of a 56 y.o. year old male who comes in today for a routine follow up .         The patient's chronic conditions are stable and unchanged . See Problem List for chronic conditions, which have been reviewed today.       Current Outpatient Medications   Medication Sig   . amLODIPine (NORVASC) 10 mg Oral Tablet Take 1 Tablet (10 mg total) by mouth Once a day   . Blood Sugar Diagnostic (ONETOUCH VERIO) Strip 1 Strip by In Vitro route Three times a day   . Blood-Glucose Meter (ONETOUCH VERIO IQ METER) Misc 1 Each by In Vitro route Three times a day   . enalapril (VASOTEC) 20 mg Oral Tablet Take 1 Tablet (20 mg total) by mouth Once a day   . glipiZIDE (GLUCOTROL XL) 10 mg Oral Tablet Extended Rel 24 hr Take 1 Tablet (10 mg total) by mouth Every morning with breakfast   . HUMIRA,CF, PEN 40 mg/0.4 mL Subcutaneous Pen Injector Kit INJECT 40 MG UNDER THE SKIN EVERY 14 DAYS   . lancets (ONETOUCH SURESOFT LANCING DEV) 28 gauge Misc 1 Each by In Vitro route Three times a day   . metoprolol succinate (TOPROL-XL) 50 mg Oral Tablet Sustained Release 24 hr Take 1 Tablet (50 mg total) by mouth Once a day   . rosuvastatin (CRESTOR) 20 mg Oral Tablet Take 1 Tablet (20 mg total) by  mouth Every evening   . Tadalafil (CIALIS) 5 mg Oral Tablet TAKE AS DIRECTED BY PRESCRIBER OR PACKAGE INSTRUCTIONS   . Tadalafil (CIALIS) 5 mg Oral Tablet Take 1 Tablet (5 mg total) by mouth Every 24 hours as needed     Patient Active Problem List   Diagnosis   . Essential hypertension   . Gastroesophageal reflux disease without esophagitis   . Urinary calculus, unspecified   . Psoriatic arthritis (CMS [place])   . Fracture of distal end of tibia   . Fracture of distal fibula   . Coronary artery disease involving native coronary artery without angina pectoris   . Diabetes mellitus, type 2 (CMS HCC)   . Back pain   . Pure hypercholesterolemia   . Worsening headaches   . Screening PSA (prostate specific antigen)   . Screen for colon cancer   . History of kidney stones   . Right shoulder pain   . Tubular adenoma of colon  Past Surgical History:   Procedure Laterality Date   . HX APPENDECTOMY      2017   . HX CARPAL TUNNEL RELEASE  1990    bil   . HX HEART CATHETERIZATION  11/09   . [place]    2010   . LITHOTRIPSY  2004   . PB LAP UMBILICAL HERNIA REPAIR  2009         Family Medical History:     Problem Relation (Age of Onset)    Cancer Father    Diabetes Mother, Father    Hypertension (High Blood Pressure) Mother          Social History     Socioeconomic History   . Marital status: Married   Tobacco Use   . Smoking status: Former     Types: Cigars   . Smokeless tobacco: Former     Types: Snuff     Quit date: 10/26/2019   . Tobacco comments:     3-4 a week   Substance and Sexual Activity   . Alcohol use: Yes     Comment: occassionally   . Drug use: No     Past Surgical History:   Procedure Laterality Date   . COLONOSCOPY N/A 10/23/2016    Performed by Melida Quitter, MD at [facility]   . HX APPENDECTOMY      2017   . HX CARPAL TUNNEL RELEASE  1990    bil   . HX HEART CATHETERIZATION  11/09   . [place]    2010   . LITHOTRIPSY  2004   . PB LAP UMBILICAL HERNIA REPAIR  2009         REVIEW OF  SYSTEMS:  General: (-) fevers (-) chills. (-) weight loss. (-) fatigue.  Lymphatic: (-) palpable masses. (-) night sweats.  Heme: (-) easy bruising (-) bleeding. (-) recurrent infections.   HEENT. (-) vision changes (-) hearing changes. (-) dysphagia. (-) sore throat.   Heart: (-) chest pain. (-) palpitation. (-) orthopnea. (-) LE edema.   Lungs: (-) dyspnea (on exertion) (-) hemoptysis. (-) cough.   Abdomen: (-) poor appetite. (-) abdominal pain. (-) nausea (-) vomiting. (-) diarrhea. (-) constipation.   GU: (-) dysuria (-) Urgency. (-) Hematuria.   MS. (-) joint pain (-) ext swelling. (-) Back pain.   Dermatologic: (-) rashes. (-) pruritus.   Psychiatric: (-) Depression. (-) anxiety. (-) insomnia.   Neurologic: (-) headaches. (-) neuropathy. (-) weakness. (-) memory problems.              Objective:     BP 120/60   Pulse 65   Temp 36.9 C (98.5 F)   Resp 16   Ht 1.753 m (_0 )   Wt 93 kg (205 lb)   SpO2 98%   BMI 30.27 kg/m         General appearance: alert, oriented x 3, in his normal state, cooperative, not in apparent distress, appearing stated age   Integumentary:   Overall examination of the patient's skin reveals- no rashes, no suspicious lesions, and no bruises. Normal coloration of skin. Normal skin moisture.  Head and Neck:  Normocephalic and atraumatic. No lesions or palpable masses. Neck is supple with full ROM. No bruit auscultated on the left or right. No nucchal rigidity. No lymphadenopathy. Thyroid:  Normal size and consistency, no palpable nodules, normal position and symmetric. Non tender.   Lungs: Respirations are non  labored. Patient has no conversational dyspnea. Lungs are clear to ausculation .    Heart: regular rate and rhythm, S1, S2 normal, no murmur  Abdomen: soft, non-tender. Bowel sounds normal. No abnormal pulsations. No rigidity and no hepatosplenomegaly . Normal active bowel sounds in all quadrants.   Extremities: extremities normal, atraumatic, no cyanosis or edema, pulses  intact in upper and lower extremities.  No varicose ulcers.  His right the volar surface shows a red nodular slightly raised area.  It does not appear to be over a tendon she does not particularly seem cystic.     Psych :  Patient is alert and oriented and cooperative with the exam. No evidence of hallucinations, delusions or homicidal/suicidal ideations.   Neuro:  Cranial Nerves 2-12 are grossly intact . Coordination is normal . Gait is normal.  No meningeal signs.     Assessment :       PHQ Questionnaire           ICD-10-CM    1. Type 2 diabetes mellitus with hyperosmolarity without coma, without long-term current use of insulin (CMS HCC)  E11.00 POCT HGB A1C    a1c 8.7% 10-02-21      2. Essential hypertension  I10 LIPID PANEL     COMPREHENSIVE METABOLIC PNL, FASTING      3. Pure hypercholesterolemia  E78.00 LIPID PANEL     COMPREHENSIVE METABOLIC PNL, FASTING      4. Gastroesophageal reflux disease without esophagitis  K21.9       5. Screening PSA (prostate specific antigen)  Z12.5     12-06-21 result 0.29      6. Screen for colon cancer  Z12.11     10-23-16      7. Psoriatic arthritis (CMS HCC)  L40.50       8. Tubular adenoma of colon  D12.6       9. Skin lesion of right upper extremity  L98.9             PLAN :   Diabetes Monitors  A1C: 7.4  A1C Date: 05/20/2022  Kidney Health:   Urine Microalbumin/Cr Ratio--3.9 Ur Microalb/Cr Ratio date--12/06/2021   eGFR --83 eGFR date--12/06/2021    Last Lipid Panel  (Last result in the past 2 years)      Cholesterol   HDL   LDL   Direct LDL   Triglycerides      12/06/21 0859 110   48   43  Comment: <100 mg/dL, Optimal  100-129 mg/dL, Near/Above Optimal  130-159 mg/dL, Borderline High  160-189 mg/dL, High  >=190 mg/dL, Very high     97          Retinal Exam Date: 12/12/2020 DM Retinopathy - Negative Date  Last Foot Exam: Not Found     He is doing better in regards to his diabetes.     Continue current treatment regimen. Patient is doing well. Patient is to call with any  problems prior to next appointment if needed.    Will draw fasting labs today.     His BP is well controlled. WIll continue meds.     He follows with urology . Is UTD on PSA tests.     He is overdue for screening colonoscopy due to colon polyps.     His BP is well controlled.     He is following with Rheum for his Psoriatic arthritis . Marland Kitchen     I told him I am little concerned that this  lesions been present on his right wrist since March.  It does not seem consistent with a tendon sheath cyst.  I did recommend that he have Dermatology take a look at that.  He lives in [place] which is quite [place] ways from here.  He said there was dermatologist close to him and he will make an appointment to have this looked at.  I told him that I am concerned since he is on Humira which can suppress his immune system.  He understands.  I told him I would be glad to do a referral if he needs 1 and he will let me know.      Return in about 3 months (around 08/20/2022).           PHQ Questionnaire         The patient was given ample opportunity to ask questions and those questions were answered to the patient's satisfaction. The patient was encouraged to be involved in their own care, and all diagnoses, medications, and medication side-effects were discussed.  A copy of the patient's medication list was printed and given to the patient. A good faith effort was made to reconcile the patient's medications.  The patient is aware that they are to contact me with any additional questions or concerns, or go to the ED in an emergency.     Future Appointments   Date Time Provider Ross   05/20/2022 11:00 AM Elease Hashimoto, DO [facility]   11/02/2022 10:30 AM Weghorst, Milton Ferguson, [place] [facility]   11/20/2022  9:45 AM Elease Hashimoto, DO [facility]   12/11/2022  9:30 AM Genia Harold, PA-C [facility]       Health Maintenance Due   Topic Date Due   . Hepatitis B Vaccine (1 of 3 - 3-dose series) Never  done   . Covid-19 Vaccine (1) Never done   . HIV Screening  Never done   . Adult Tdap-Td (1 - Tdap) Never done   . Shingles Vaccine (1 of 2) Never done   . Pneumococcal Vaccine, Age 88-64 (2 - PCV) 10/31/2005   . Colonoscopy  10/23/2021   . Diabetic Retinal Exam  12/12/2021     Health Maintenance   Topic Date Due   . Hepatitis B Vaccine (1 of 3 - 3-dose series) Never done   . Covid-19 Vaccine (1) Never done   . HIV Screening  Never done   . Adult Tdap-Td (1 - Tdap) Never done   . Shingles Vaccine (1 of 2) Never done   . Pneumococcal Vaccine, Age 88-64 (2 - PCV) 10/31/2005   . Colonoscopy  10/23/2021   . Diabetic Retinal Exam  12/12/2021   . Influenza Vaccine (1) 07/17/2022   . Diabetic A1C  08/20/2022   . Depression Screening  10/02/2022   . Diabetic Kidney Health eGFR  12/06/2022   . Diabetic Kidney Health Microalb/Cr Ratio  12/06/2022   . Prostate Cancer Screening  12/07/2023   . Meningococcal Vaccine  Aged Out                 This note was partially generated using MModal Fluency Direct system, and there may be some incorrect words, spellings, and punctuation that were not noted in checking the note before saving.    Dwyane Luo, DO    [facility]  [address]   [place], [place]  [address]  Dept. Phone  /  [phone]  Dept  Fax / [phone]

## 2022-05-21 ENCOUNTER — Other Ambulatory Visit (HOSPITAL_BASED_OUTPATIENT_CLINIC_OR_DEPARTMENT_OTHER): Payer: Self-pay | Admitting: Surgical

## 2022-05-21 ENCOUNTER — Other Ambulatory Visit: Payer: Self-pay

## 2022-05-21 DIAGNOSIS — L4050 Arthropathic psoriasis, unspecified: Secondary | ICD-10-CM

## 2022-05-21 MED ORDER — HUMIRA(CF) PEN 40 MG/0.4 ML SUBCUTANEOUS KIT
PEN_INJECTOR | SUBCUTANEOUS | 5 refills | Status: DC
Start: 2022-05-21 — End: 2022-10-23

## 2022-05-21 NOTE — Telephone Encounter (Signed)
AST (SGOT)    Date Value Ref Range Status   05/20/2022 22 8 - 45 U/L Final     ALT (SGPT)   Date Value Ref Range Status   05/20/2022 33 10 - 55 U/L Final     CREATININE   Date Value Ref Range Status   05/20/2022 1.10 0.75 - 1.35 mg/dL Final       COMPLETE BLOOD COUNT   Lab Results   Component Value Date    WBC 5.5 12/06/2021    HGB 15.2 12/06/2021    HCT 44.9 12/06/2021    PLTCNT 236 12/06/2021       DIFFERENTIAL  Lab Results   Component Value Date    PMNS 48 12/06/2021    LYMPHOCYTES 37 12/06/2021    MONOCYTES 10 12/06/2021    EOSINOPHIL 4 12/06/2021    BASOPHILS 1 12/06/2021    NRBCS 0 09/09/2011    PMNABS 3.82 10/07/2015    LYMPHSABS 2.09 10/07/2015    EOSABS 0.13 10/07/2015    MONOSABS 0.56 10/07/2015    BASOSABS 0.056 09/28/2014          Last visit: 11/03/21  Next visit: 11/02/22    Wesley Desanctis, RN  05/21/2022, 07:25

## 2022-05-21 NOTE — Telephone Encounter (Signed)
I left message that refills of requested medication sent to [place] [facility] [place] and if questions to return the call.  Wesley Desanctis, RN

## 2022-05-21 NOTE — Telephone Encounter (Signed)
Regarding: rx request  ----- Message from Donley Redder sent at 05/20/2022  5:01 PM EDT -----  Doctor Name:  Dessie Coma, [place]       Date of last appointment: 11/03/21    Next scheduled visit: 11/02/22    Medication Requested:   HUMIRA,CF, PEN 40 mg/0.4 mL Subcutaneous Pen Injector Kit      [facility], [place] [address]    Phone: [phone] Fax: [phone]    Hours: Not open 24 hours          Notes for Nurse or Physician:

## 2022-06-01 DIAGNOSIS — E1169 Type 2 diabetes mellitus with other specified complication: Secondary | ICD-10-CM | POA: Diagnosis not present

## 2022-06-01 DIAGNOSIS — E785 Hyperlipidemia, unspecified: Secondary | ICD-10-CM | POA: Diagnosis not present

## 2022-06-01 DIAGNOSIS — M459 Ankylosing spondylitis of unspecified sites in spine: Secondary | ICD-10-CM | POA: Diagnosis not present

## 2022-06-01 DIAGNOSIS — M461 Sacroiliitis, not elsewhere classified: Secondary | ICD-10-CM | POA: Diagnosis not present

## 2022-06-11 DIAGNOSIS — M459 Ankylosing spondylitis of unspecified sites in spine: Secondary | ICD-10-CM | POA: Diagnosis not present

## 2022-06-18 DIAGNOSIS — E876 Hypokalemia: Secondary | ICD-10-CM | POA: Diagnosis not present

## 2022-07-03 DIAGNOSIS — E876 Hypokalemia: Secondary | ICD-10-CM | POA: Diagnosis not present

## 2022-07-14 DIAGNOSIS — M459 Ankylosing spondylitis of unspecified sites in spine: Secondary | ICD-10-CM | POA: Diagnosis not present

## 2022-08-13 DIAGNOSIS — M459 Ankylosing spondylitis of unspecified sites in spine: Secondary | ICD-10-CM | POA: Diagnosis not present

## 2022-08-27 ENCOUNTER — Other Ambulatory Visit (INDEPENDENT_AMBULATORY_CARE_PROVIDER_SITE_OTHER): Payer: Self-pay | Admitting: Family Medicine

## 2022-08-27 DIAGNOSIS — Z1211 Encounter for screening for malignant neoplasm of colon: Secondary | ICD-10-CM

## 2022-08-31 ENCOUNTER — Telehealth (HOSPITAL_BASED_OUTPATIENT_CLINIC_OR_DEPARTMENT_OTHER): Payer: Self-pay | Admitting: Surgical

## 2022-08-31 ENCOUNTER — Other Ambulatory Visit: Payer: Self-pay

## 2022-08-31 NOTE — Nursing Note (Signed)
Received drug request from [facility] that needs completed/signed.  These forms will be placed in your mailbox for completion.   Chase [name] 08/31/2022, 07:37

## 2022-08-31 NOTE — Telephone Encounter (Signed)
Forms completed and signed and placed in fax folder   Thank you

## 2022-09-01 ENCOUNTER — Other Ambulatory Visit: Payer: Self-pay

## 2022-09-02 ENCOUNTER — Telehealth (INDEPENDENT_AMBULATORY_CARE_PROVIDER_SITE_OTHER): Payer: Self-pay | Admitting: INTERNAL MEDICINE

## 2022-09-02 NOTE — Telephone Encounter (Signed)
LM for pt to schedule screening colon with Dr. Jarold Song, [employer] YOV NAR, BMI 30.2, confirm meds

## 2022-09-03 ENCOUNTER — Other Ambulatory Visit: Payer: Self-pay

## 2022-09-03 NOTE — Telephone Encounter (Signed)
Specialty Pharmacy Note    Prior Authorization for medication Humira has been approved by payor [employer] from 07/03/2022 until 09/02/2023.  Approval notice has been scanned into Epic and can be found in the Media tab.    Staff message sent to [place] noting the completion of prior authorization. Per the patient's request the prescription will be filled at [facility]. No further action is required.    If you have any questions, don't hesitate to contact the [place], Pharmacy Technician

## 2022-09-03 NOTE — Nursing Note (Signed)
Received approval for Humira until 09/02/2023. Script has already been sent to correct pharmacy. No further actions needed at this time for this medication.   Garfield Cornea, RN 09/03/2022 13:16

## 2022-09-04 ENCOUNTER — Encounter (INDEPENDENT_AMBULATORY_CARE_PROVIDER_SITE_OTHER): Payer: Self-pay

## 2022-09-04 NOTE — Addendum Note (Signed)
Addended by: Polo Riley on: 09/04/2022 01:29 PM     Modules accepted: Orders

## 2022-09-04 NOTE — Telephone Encounter (Signed)
Pt call back in is now scheduled for Nov 30 at 29 in [place] with Dr Jarold Song pt has history of polyps     BMI 30.27  Inc blue cross   No blood thinner   No other meds   Suprep needs called into   [place]   [employer]

## 2022-09-07 DIAGNOSIS — E1169 Type 2 diabetes mellitus with other specified complication: Secondary | ICD-10-CM | POA: Diagnosis not present

## 2022-09-07 DIAGNOSIS — M459 Ankylosing spondylitis of unspecified sites in spine: Secondary | ICD-10-CM | POA: Diagnosis not present

## 2022-09-07 DIAGNOSIS — E785 Hyperlipidemia, unspecified: Secondary | ICD-10-CM | POA: Diagnosis not present

## 2022-09-07 DIAGNOSIS — M461 Sacroiliitis, not elsewhere classified: Secondary | ICD-10-CM | POA: Diagnosis not present

## 2022-09-07 MED ORDER — SODIUM,POTASSIUM,MAG SULFATES 17.5 GRAM-3.13 GRAM-1.6 GRAM ORAL SOLN
1.0000 | ORAL | 0 refills | Status: DC
Start: 2022-09-07 — End: 2022-10-15

## 2022-09-07 NOTE — Addendum Note (Signed)
Addended byCandi Leash on: 11/26/347 08:39 AM     Modules accepted: Orders

## 2022-09-15 DIAGNOSIS — M459 Ankylosing spondylitis of unspecified sites in spine: Secondary | ICD-10-CM | POA: Diagnosis not present

## 2022-10-15 ENCOUNTER — Other Ambulatory Visit: Payer: Self-pay

## 2022-10-15 ENCOUNTER — Encounter (HOSPITAL_COMMUNITY): Payer: Self-pay | Admitting: INTERNAL MEDICINE

## 2022-10-15 ENCOUNTER — Encounter (HOSPITAL_COMMUNITY)
Admission: RE | Disposition: A | Discharge status: Home / Self Care | Payer: Self-pay | Source: Ambulatory Visit | Attending: INTERNAL MEDICINE

## 2022-10-15 ENCOUNTER — Inpatient Hospital Stay
Admission: RE | Admit: 2022-10-15 | Discharge: 2022-10-15 | Disposition: A | Discharge status: Home / Self Care | Payer: BC Managed Care – PPO | Source: Ambulatory Visit | Attending: INTERNAL MEDICINE | Admitting: INTERNAL MEDICINE

## 2022-10-15 ENCOUNTER — Ambulatory Visit (HOSPITAL_COMMUNITY): Payer: BC Managed Care – PPO | Admitting: Certified Registered"

## 2022-10-15 DIAGNOSIS — M459 Ankylosing spondylitis of unspecified sites in spine: Secondary | ICD-10-CM | POA: Diagnosis not present

## 2022-10-15 DIAGNOSIS — L4050 Arthropathic psoriasis, unspecified: Secondary | ICD-10-CM | POA: Insufficient documentation

## 2022-10-15 DIAGNOSIS — E785 Hyperlipidemia, unspecified: Secondary | ICD-10-CM | POA: Insufficient documentation

## 2022-10-15 DIAGNOSIS — Z1211 Encounter for screening for malignant neoplasm of colon: Secondary | ICD-10-CM

## 2022-10-15 DIAGNOSIS — K640 First degree hemorrhoids: Secondary | ICD-10-CM | POA: Insufficient documentation

## 2022-10-15 DIAGNOSIS — Q438 Other specified congenital malformations of intestine: Secondary | ICD-10-CM | POA: Insufficient documentation

## 2022-10-15 DIAGNOSIS — Z87442 Personal history of urinary calculi: Secondary | ICD-10-CM | POA: Insufficient documentation

## 2022-10-15 DIAGNOSIS — K219 Gastro-esophageal reflux disease without esophagitis: Secondary | ICD-10-CM | POA: Insufficient documentation

## 2022-10-15 DIAGNOSIS — Z86010 Personal history of colonic polyps: Secondary | ICD-10-CM | POA: Insufficient documentation

## 2022-10-15 DIAGNOSIS — I10 Essential (primary) hypertension: Secondary | ICD-10-CM | POA: Insufficient documentation

## 2022-10-15 HISTORY — PX: COLONOSCOPY: SHX174

## 2022-10-15 LAB — POC BLOOD GLUCOSE (RESULTS): GLUCOSE, POC: 158 mg/dl — ABNORMAL HIGH (ref 74–106)

## 2022-10-15 SURGERY — COLONOSCOPY
Anesthesia: Monitor Anesthesia Care | Wound class: Clean Contaminated Wounds-The respiratory, GI, Genital, or urinary

## 2022-10-15 MED ORDER — LACTATED RINGERS INTRAVENOUS SOLUTION
INTRAVENOUS | Status: DC | PRN
Start: 2022-10-15 — End: 2022-10-15
  Administered 2022-10-15: 0 via INTRAVENOUS

## 2022-10-15 MED ORDER — PROPOFOL 10 MG/ML IV BOLUS
INJECTION | Freq: Once | INTRAVENOUS | Status: DC | PRN
Start: 2022-10-15 — End: 2022-10-15
  Administered 2022-10-15: 50 mg via INTRAVENOUS
  Administered 2022-10-15: 200 mg via INTRAVENOUS
  Administered 2022-10-15: 100 mg via INTRAVENOUS
  Administered 2022-10-15: 50 mg via INTRAVENOUS

## 2022-10-15 MED ORDER — FENTANYL (PF) 50 MCG/ML INJECTION SOLUTION
Freq: Once | INTRAMUSCULAR | Status: DC | PRN
Start: 2022-10-15 — End: 2022-10-15
  Administered 2022-10-15: 50 ug via INTRAVENOUS

## 2022-10-15 MED ORDER — LIDOCAINE (PF) 100 MG/5 ML (2 %) INTRAVENOUS SYRINGE
INJECTION | Freq: Once | INTRAVENOUS | Status: DC | PRN
Start: 2022-10-15 — End: 2022-10-15
  Administered 2022-10-15: 100 mg via INTRAVENOUS

## 2022-10-15 MED ORDER — LACTATED RINGERS INTRAVENOUS SOLUTION
INTRAVENOUS | Status: DC
Start: 2022-10-15 — End: 2022-10-15

## 2022-10-15 MED ORDER — DEXMEDETOMIDINE 4 MCG/ML IV DILUTION
Freq: Once | INTRAMUSCULAR | Status: DC | PRN
Start: 2022-10-15 — End: 2022-10-15
  Administered 2022-10-15: 10 ug via INTRAVENOUS

## 2022-10-15 SURGICAL SUPPLY — 49 items
ADAPTER CATH 11/32IN 1/8-3/8IN PLASTIC LL SYRG CONN POS GRIP RIDGE STRL LF  3/32IN TAPER (UROLOGICAL SUPPLIES) ×1
ADAPTER CATH 11/32IN 1/8-3/8IN_3/32IN TPR PLASTIC LL SYRG (UROLOGICAL SUPPLIES) ×1
BASIN EME 16OZ 9X3.8X2IN GRAD_FLXB DISP DST ROSE POLYPROP (MED SURG SUPPLIES)
BASIN EME 8.4X3.8X2IN GRAD DISP DST ROSE POLYPROP C500ML LF (MED SURG SUPPLIES) IMPLANT
BASKET SPEC RETR 190CMX20MM 3.7MM FLOWERBASKETV C HOOK 8WR (INSTRUMENTS ENDOMECHANICAL)
BASKET SPEC RETR 190CMX20MM 3.7MM FLOWERBASKETV C HOOK 8WR GW STRL DISP (ENDOSCOPIC SUPPLIES) IMPLANT
CAN SUCT 1200CC LF (MED SURG SUPPLIES) IMPLANT
CLIP LGT RSL 360 ULTRA 235CM BRD ROT CONTROL KNOB 17MM OPN (ENDOSCOPIC SUPPLIES) IMPLANT
CONV USE 48075 - SYRINGE 60ML LF  STRL LL MED (MED SURG SUPPLIES) ×1 IMPLANT
DUPE USE ITEM 153319 - CLIP LGT RSL 360 ULTRA 235CM BRD ROT CONTROL KNOB 17MM OPN (ENDOSCOPIC SUPPLIES) IMPLANT
DUPE USE ITEM 162462 - ADAPTER CATH 11/32IN 1/8-3/8IN PLASTIC LL SYRG CONN POS GRIP RIDGE STRL LF  3/32IN TAPER (UROLOGICAL SUPPLIES) ×1 IMPLANT
ELECTRODE PATIENT RTN 9FT VLAB C30- LB RM PHSV ACRL FOAM CORD NONIRRITATE NONSENSITIZE ADH STRP (SURGICAL CUTTING SUPPLIES) IMPLANT
ELECTRODE PATIENT RTN 9FT VLAB_REM C30- LB PLHSV ACRL FOAM (CUTTING ELEMENTS)
FORCEPS BIOPSY HOT 240CM 2.2MM RJ 4 +2.8MM DISP (ENDOSCOPIC SUPPLIES)
FORCEPS BIOPSY HOT 240CM 2.2MM RJ 4 +2.8MM DISPO (ENDOSCOPIC SUPPLIES) IMPLANT
FORCEPS BIOPSY HOT 240CM 2.2MM_RJ 4 +2.8MM DISP (INSTRUMENTS ENDOMECHANICAL)
FORCEPS BIOPSY MICROMESH TTH STREAMLINE CATH 240CM 2.4MM RJ (ENDOSCOPIC SUPPLIES) IMPLANT
FORCEPS BIOPSY MICROMESH TTH STREAMLINE CATH NEEDLE 240CM (ENDOSCOPIC SUPPLIES) IMPLANT
FORCEPS BIOPSY NEEDLE 240CM 2.2MM RJ 4 2.8MM STD CPC STRL (ENDOSCOPIC SUPPLIES) IMPLANT
GOWN PROTECT XL BLU FULL BCK HKLP NK KNITCUFF SMS 49X30IN 21IN (GLOVES AND ACCESSORIES) ×2 IMPLANT
GOWN PROTECT XL BLU FULL BCK H_KLP NK KNITCUFF SMS 49X30IN 21 (GLOVES AND ACCESSORIES) ×2
KIT 1.1+ DE BIOPSY BX20 (INSTRUMENTS ENDOMECHANICAL) ×1
KIT DECOMPRESS 175CM 14FR 6FR .035IN COLON 10 DRAIN SDPRT CATH GW STRL DISP (MED SURG SUPPLIES) IMPLANT
KIT DECOMPRESS 175CM 14FR 6FR_.035IN COLON 10 DRAIN SDPRT (MED SURG SUPPLIES)
KIT ENDOS CMPLN ENDOKIT ORCAPOD 4 2 END 1.1OZ (ENDOSCOPIC SUPPLIES) ×1 IMPLANT
MANIFLD SUCT NPTN 2 STD 1 PORT WASTE MGMT SYS NONST LF  DISP (MED SURG SUPPLIES) ×1 IMPLANT
MANIFLD SUCT NPTN 2 STD 1 PORT_WASTE MGMT SYS NONST LF DISP (MED SURG SUPPLIES) ×1
NEEDLE SCLRTX 25GA 2.3MM BVL STRL DISP STAR CATH INTJCT 4MM 240CM (ENDOSCOPIC SUPPLIES) IMPLANT
NEEDLE SCLRTX 25GA 2.3MM BVL S_TRL DISP STAR CATH INTJCT 4MM (INSTRUMENTS ENDOMECHANICAL)
ORCA DISPOSABLE BUTTONS (INSTRUMENTS ENDOMECHANICAL)
PROBE ESURG 220CM 2.3MM FIAPC FLXB STR FIRE STRL DISP (SURGICAL CUTTING SUPPLIES) IMPLANT
PROBE ESURG 220CM 2.3MM FIAPC_FLXB STR FIRE ARGON PLAS COAG (CUTTING ELEMENTS)
SNARE MICRO 240IN 13MM CAPTIVATR PLPCTM HEX ENDOS STRL DISP (ENDOSCOPIC SUPPLIES) IMPLANT
SNARE RND 240CM 2.4MM CAPTIVATR COLD STF THN WRE ENDOS PLPCTM 10MM DISP (ENDOSCOPIC SUPPLIES) IMPLANT
SNARE SM HEX 240CM 2.4MM CAPTI_VATR LOOP STF ENDOS PLPCTM 13 (INSTRUMENTS ENDOMECHANICAL)
SNARE SM OVAL 195CMX13MM 2.4MM TRTQ ROT STAR CATH MED STF ENDOS PLPCTM 2.8MM STRL DISP BLU (ENDOSCOPIC SUPPLIES) IMPLANT
SNARE SM OVL 240CMX13MM 2.4MM_TRTQ ROT STAR CATH MED STF (INSTRUMENTS ENDOMECHANICAL)
SNARE SURG 10MM RND COLD (INSTRUMENTS ENDOMECHANICAL)
SYRINGE 60ML LF  STRL LL MED (MED SURG SUPPLIES) ×1
SYRINGE 60ML LF STRL LL MED (MED SURG SUPPLIES) ×1
TRAP MUCUS 40ML 5IN PLASTIC TRNSPT CAP CONTAINR DISP ADPR (MISCELLANEOUS PT CARE ITEMS)
TRAP MUCUS 40ML 5IN PLASTIC TRNSPT CAP CONTAINR DISP ADPR SUCT TUBE STRL LF (MISCELLANEOUS PT CARE ITEMS) IMPLANT
TRAP SPECI REM TRPS QD 4 REM CHAMBER SAF SCRN PARABOLA SLOT (ENDOSCOPIC SUPPLIES) IMPLANT
TRAP SPECI REM TRPS QD 4 REM C_HAMBER SAF SCRN PARABOLA SLOT (INSTRUMENTS ENDOMECHANICAL)
TUBING SUCT 10FT .25IN AMSURE NONST LF (MED SURG SUPPLIES) ×1
TUBING SUCT LIGHT BLU 10FT .25IN AMSURE FEMALE CONN BPA FREE COLLAPSE RST FLXB NONST LF  DISP (MED SURG SUPPLIES) ×1 IMPLANT
USE ITEM 343174 SNARE RND 240CM 2.4MM CAPTIVATR COLD STF THN WRE ENDOS PLPCTM 10MM DISP (ENDOSCOPIC SUPPLIES) IMPLANT
USE ITEM 77895 FORCEPS BIOPSY MICROMESH TTH STREAMLINE CATH NEEDLE 240CM (ENDOSCOPIC SUPPLIES) IMPLANT
VALVE SUCT ORCAPOD ORCA SEAL AIR WATER SIL FREE ENCLOSE SPRG BIOPSY OLMPS 160/180/190 SER AUX PORT (ENDOSCOPIC SUPPLIES) IMPLANT

## 2022-10-15 NOTE — Anesthesia Postprocedure Evaluation (Signed)
Anesthesia Post Op Evaluation    Patient: Chase [name]  Procedure(s):  COLONOSCOPY DIAGNOSTIC    Last Vitals:Temperature: 36.4 C (97.5 F) (10/15/22 1045)  Heart Rate: 78 (10/15/22 1045)  BP (Non-Invasive): 132/70 (10/15/22 1045)  Respiratory Rate: 16 (10/15/22 1045)  SpO2: 98 % (10/15/22 1045)    No notable events documented.    Patient is sufficiently recovered from the effects of anesthesia to participate in the evaluation and has returned to their pre-procedure level.  Patient location during evaluation: PACU       Patient participation: complete - patient participated  Level of consciousness: awake and alert and responsive to verbal stimuli    Pain management: adequate  Airway patency: patent    Anesthetic complications: no  Cardiovascular status: acceptable  Respiratory status: acceptable  Hydration status: acceptable  Patient post-procedure temperature: Pt Normothermic   PONV Status: Absent

## 2022-10-15 NOTE — H&P (Signed)
[facility]  Surgical History and Physical                                                     Chase [name], Chase [name], 57 y.o. male  Date of Admission:  10/15/2022  Date of Birth:  [birth year]    10/15/2022  Chief Complaint: Procedure(s):  COLONOSCOPY DIAGNOSTIC     History obtained patient    HPI:  Patient with history of colon polyps. Patient denies GI complaints. Denies family history of colon or rectal cancer.   Denies fever, chills, CP or SOB.       No Known Allergies  Medications:  Current Outpatient Medications   Medication Instructions    adalimumab (HUMIRA,CF, PEN) 40 mg/0.4 mL Subcutaneous Pen Injector Kit INJECT 40 MG UNDER THE SKIN EVERY 14 DAYS    amLODIPine (NORVASC) 10 mg, Oral, DAILY    Blood Sugar Diagnostic (ONETOUCH VERIO) Strip 1 Strip, In Vitro, 3 TIMES DAILY    Blood-Glucose Meter (ONETOUCH VERIO IQ METER) Misc 1 Each, In Vitro, 3 TIMES DAILY    enalapril (VASOTEC) 20 mg, Oral, DAILY    glipiZIDE (GLUCOTROL XL) 10 mg, Oral, EVERY MORNING WITH BREAKFAST    lancets (ONETOUCH SURESOFT LANCING DEV) 28 gauge Misc 1 Each, In Vitro, 3 TIMES DAILY    metoprolol succinate (TOPROL-XL) 50 mg, Oral, DAILY    rosuvastatin (CRESTOR) 20 mg, Oral, EVERY EVENING    Tadalafil (CIALIS) 5 mg Oral Tablet TAKE AS DIRECTED BY PRESCRIBER OR PACKAGE INSTRUCTIONS     Review of systems:   as per HPI  Past Medical History:   Diagnosis Date    Arthritis with psoriasis (CMS HCC)     Arthropathy     psoriatic    CAD (coronary artery disease)     Diabetes mellitus, type 2 (CMS HCC) 03/19/2009    Fracture of distal end of tibia     left    Fracture of distal fibula     left    GERD (gastroesophageal reflux disease)     Hypertension     Kidney stones     Right shoulder pain     Type 2 diabetes mellitus (CMS HCC)     Urinary calculus, unspecified     Renal stones         Past Surgical History:   Procedure Laterality Date    HX APPENDECTOMY      2017    HX CARPAL TUNNEL RELEASE  1990    bil    HX HEART CATHETERIZATION  11/09     HX HERNIA REPAIR  1970    2010    LITHOTRIPSY  1610    PB LAP UMBILICAL HERNIA REPAIR  2009         Social History     Socioeconomic History    Marital status: Married    Number of children: 3   Tobacco Use    Smoking status: Former     Types: Cigars    Smokeless tobacco: Former     Types: Snuff     Quit date: 10/26/2019    Tobacco comments:     3-4 a week   Vaping Use    Vaping Use: Never used   Substance and Sexual Activity    Alcohol use: Yes     Comment: occassionally  Drug use: No     Social Determinants of Health     Social Connections: Low Risk  (10/15/2022)    Social Connections     SDOH Social Isolation: 5 or more times a week     Family Medical History:       Problem Relation (Age of Onset)    Cancer Father    Diabetes Mother, Father    Hypertension (High Blood Pressure) Mother               Examination:  BP (!) 147/78   Pulse 78   Temp 36.8 C (98.2 F)   Resp 18   Ht 1.753 m (_0 )   Wt 96.6 kg (213 lb)   SpO2 97%   BMI 31.45 kg/m         Physical Exam  Vitals reviewed.   Constitutional:       Appearance: Normal appearance. He is normal weight.   HENT:      Head: Normocephalic and atraumatic.   Eyes:      Pupils: Pupils are equal, round, and reactive to light.   Cardiovascular:      Rate and Rhythm: Normal rate and regular rhythm.      Pulses: Normal pulses.      Heart sounds: Normal heart sounds.   Pulmonary:      Effort: Pulmonary effort is normal.      Breath sounds: Normal breath sounds.   Abdominal:      General: Bowel sounds are normal.      Palpations: Abdomen is soft.   Musculoskeletal:         General: Normal range of motion.      Cervical back: Normal range of motion and neck supple.   Skin:     General: Skin is warm and dry.   Neurological:      General: No focal deficit present.      Mental Status: He is alert and oriented to person, place, and time. Mental status is at baseline.   Psychiatric:         Mood and Affect: Mood normal.         Behavior: Behavior normal.           Diagnostic Imaging:  No orders to display        Assessment:   History of colon polyps     Plan: Patient here today for Procedure(s):  COLONOSCOPY DIAGNOSTIC   with Surgeon(s):  Burgess Amor, MD.      Ignacia Marvel, FNP-C   10/15/2022, 08:29

## 2022-10-15 NOTE — OR Surgeon (Signed)
[facility]   Dr Lovell Sheehan, 56 y.o. male  Date of Admission:  10/15/2022  Date of service: 10/15/2022  Date of Birth:  [birth year]    SURGEON:  Burgess Amor, MD     PROCEDURE PERFORMED:  Colonoscopy    INDICATION:  Personal history of polyps    HISTORY:  56 year old male with personal history of polyps.  Last colonoscopy was performed in 2017.  No family history of colon cancer.    CONSENT:  The patient is competent.  The procedure was explained to the patient.Risks and benefits of  colonoscopy was discussed with the patient.  We discussed risk of bleeding, infection, cardiopulmonary complication, perforation that can results in significant morbidity and mortality, including colostomy and Death.  We discussed risk of missing lesion that can result and delayed diagnosis and treatment of diseases including malignancy.  We also discussed the risk of missing colon cancer or precancerous polyps.    PHYSICAL EXAM:  Heart regular.    Lungs clear.    Abdominal soft and bowel sounds present.    PROCEDURE:  Prior to the procedure, history and physical was performed, and patient medications and allergies were reviewed.    After the risks and benefits of the procedure was discussed with the patient.  The patient deemed to be in satisfactory condition to proceed with the procedure.  After obtaining informed consent, the flexible colonoscope  was passed under direct visualization from the anus to the cecum .  Retroflexion was performed in the rectum.  Throughout the procedure the patient's blood pressure pulse, and oxygen saturation were monitored continuously.  The procedure was accomplished without immediate complication.      Prep quality was good in the left side and fair in the right.  Estimated blood loss none.    FINDING:  Rectal exam was normal    Tortuous colon and fair preparation in the right side of the colon.  This made it technically difficult colonoscopy.  We were able to reach the  cecum by water insufflation and putting the patient on his back and applying pressure on the abdomen.      Mild grade 1 internal hemorrhoids     Otherwise rest of the examined rectum, sigmoid, descending, transverse colon, ascending colon, and cecum appeared to be normal.    IMPRESSION:  Internal hemorrhoids     Fair preparation right side of the colon and tortuous colon.  The cecum was reached by water insufflation, placing the patient on the his back and applying pressure.    Otherwise normal colon up to the cecum    RECOMMENDATION:  Colonoscopy is recommended in 5 years.    Resume all home medications and diet today .    Follow-up with primary care physician.    Send copy of this documents to the referring physician and primary care physician.      Burgess Amor, MD

## 2022-10-15 NOTE — Anesthesia Transfer of Care (Signed)
ANESTHESIA TRANSFER OF CARE   Chase [name] is a 56 y.o. ,male, Weight: 96.6 kg (213 lb)   had Procedure(s):  COLONOSCOPY DIAGNOSTIC  performed  10/15/22   Primary Service: Burgess Amor, MD    Past Medical History:   Diagnosis Date   . Arthritis with psoriasis (CMS HCC)    . Arthropathy     psoriatic   . CAD (coronary artery disease)    . Diabetes mellitus, type 2 (CMS [place]) 03/19/2009   . Fracture of distal end of tibia     left   . Fracture of distal fibula     left   . GERD (gastroesophageal reflux disease)    . Hypertension    . Kidney stones    . Right shoulder pain    . Type 2 diabetes mellitus (CMS HCC)    . Urinary calculus, unspecified     Renal stones      Allergy History as of 10/15/22        No Active Allergies              NO KNOWN DRUG ALLERGIES         Noted Status Severity Type Reaction    10/31/10 1006 Samuella Bruin  Deleted       11/02/07   Active                 HYMENOPTERA ALLERGENIC EXTRACT         Noted Status Severity Type Reaction    10/31/10 1008 Samuella Bruin 10/31/10 Deleted       10/31/10 1006 Samuella Bruin 10/31/10 Active                     I completed my transfer of care / handoff to the receiving personnel during which we discussed:  Access, Airway, All key/critical aspects of case discussed, Analgesia, Antibiotics, Expectation of post procedure, Fluids/Product, Gave opportunity for questions and acknowledgement of understanding, Labs and PMHx      Post Location: PACU                        Additional Info:RN                                   Last OR Temp: Temperature: 36.2 C (97.2 F)  ABG:  POTASSIUM   Date Value Ref Range Status   05/20/2022 4.6 3.5 - 5.1 mmol/L Final     KETONES   Date Value Ref Range Status   12/12/2021 Negative Negative mg/dL Final     CALCIUM   Date Value Ref Range Status   05/20/2022 10.1 (H) 8.5 - 10.0 mg/dL Final     Calculated P Axis   Date Value Ref Range Status   10/26/2019 31 degrees Final     Calculated R Axis   Date Value Ref Range Status    10/26/2019 -13 degrees Final     Calculated T Axis   Date Value Ref Range Status   10/26/2019 24 degrees Final     Airway:* No LDAs found *  Blood pressure 103/67, pulse 82, temperature 36.2 C (97.2 F), resp. rate 15, height 1.753 m ('5\' 9"'$ ), weight 96.6 kg (213 lb), SpO2 92%.

## 2022-10-15 NOTE — Anesthesia Preprocedure Evaluation (Addendum)
ANESTHESIA PRE-OP EVALUATION  Planned Procedure: COLONOSCOPY DIAGNOSTIC  Review of Systems     anesthesia history negative     patient summary reviewed  nursing notes reviewed        Pulmonary  negative pulmonary ROS,    Cardiovascular    Hypertension, ECG reviewed and hyperlipidemia ,No peripheral edema and no CAD,        GI/Hepatic/Renal    GERD and kidney stones        Endo/Other    osteoarthritis (Psoriatic),   type 2 diabetes    Neuro/Psych/MS    headaches     Cancer    negative hematology/oncology ROS,                     Physical Assessment      Airway       Mallampati: II    TM distance: <3 FB    Neck ROM: full  Mouth Opening: good.            Dental       Dentition intact             Pulmonary    Breath sounds clear to auscultation  (-) no rhonchi, no decreased breath sounds, no wheezes, no rales and no stridor     Cardiovascular    Rhythm: regular  Rate: Normal  (-) no friction rub, carotid bruit is not present, no peripheral edema and no murmur     Other findings              Plan  ASA 3     Planned anesthesia type: MAC                       Intravenous induction     Anesthesia issues/risks discussed are: Intraoperative Awareness/ Recall, Sore Throat, Aspiration and PONV.  Anesthetic plan and risks discussed with patient  signed consent obtained            Patient's NPO status is appropriate for Anesthesia.           (Pt. Took all DM pills this am, BS 158.)

## 2022-10-21 ENCOUNTER — Other Ambulatory Visit (HOSPITAL_BASED_OUTPATIENT_CLINIC_OR_DEPARTMENT_OTHER): Payer: Self-pay | Admitting: Medical

## 2022-10-21 ENCOUNTER — Telehealth (HOSPITAL_BASED_OUTPATIENT_CLINIC_OR_DEPARTMENT_OTHER): Payer: Self-pay | Admitting: Medical

## 2022-10-21 DIAGNOSIS — R3915 Urgency of urination: Secondary | ICD-10-CM

## 2022-10-21 DIAGNOSIS — Z87442 Personal history of urinary calculi: Secondary | ICD-10-CM

## 2022-10-21 DIAGNOSIS — R350 Frequency of micturition: Secondary | ICD-10-CM

## 2022-10-21 DIAGNOSIS — N400 Enlarged prostate without lower urinary tract symptoms: Secondary | ICD-10-CM

## 2022-10-21 NOTE — Telephone Encounter (Signed)
PT called stating he needs a refill for Cialis

## 2022-10-22 ENCOUNTER — Other Ambulatory Visit (HOSPITAL_BASED_OUTPATIENT_CLINIC_OR_DEPARTMENT_OTHER): Payer: Self-pay | Admitting: Medical

## 2022-10-22 MED ORDER — TADALAFIL 5 MG TABLET
ORAL_TABLET | ORAL | 2 refills | Status: DC
Start: 2022-10-22 — End: 2023-03-26

## 2022-10-23 ENCOUNTER — Other Ambulatory Visit (HOSPITAL_BASED_OUTPATIENT_CLINIC_OR_DEPARTMENT_OTHER): Payer: Self-pay | Admitting: Surgical

## 2022-10-23 DIAGNOSIS — L4050 Arthropathic psoriasis, unspecified: Secondary | ICD-10-CM

## 2022-10-23 NOTE — Nursing Note (Signed)
AST (SGOT)    Date Value Ref Range Status   05/20/2022 22 8 - 45 U/L Final     ALT (SGPT)   Date Value Ref Range Status   05/20/2022 33 10 - 55 U/L Final     CREATININE   Date Value Ref Range Status   05/20/2022 1.10 0.75 - 1.35 mg/dL Final       COMPLETE BLOOD COUNT   Lab Results   Component Value Date    WBC 5.5 12/06/2021    HGB 15.2 12/06/2021    HCT 44.9 12/06/2021    PLTCNT 236 12/06/2021       DIFFERENTIAL  Lab Results   Component Value Date    PMNS 48 12/06/2021    LYMPHOCYTES 37 12/06/2021    MONOCYTES 10 12/06/2021    EOSINOPHIL 4 12/06/2021    BASOPHILS 1 12/06/2021    NRBCS 0 09/09/2011    PMNABS 3.82 10/07/2015    LYMPHSABS 2.09 10/07/2015    EOSABS 0.13 10/07/2015    MONOSABS 0.56 10/07/2015    BASOSABS 0.056 09/28/2014          Last visit: 10/2021  Next visit: 10/2022    Tonna Corner, CMA  10/23/2022, 08:27

## 2022-10-29 ENCOUNTER — Other Ambulatory Visit (HOSPITAL_BASED_OUTPATIENT_CLINIC_OR_DEPARTMENT_OTHER): Payer: Self-pay | Admitting: Surgical

## 2022-10-29 DIAGNOSIS — L4050 Arthropathic psoriasis, unspecified: Secondary | ICD-10-CM

## 2022-10-29 NOTE — Nursing Note (Unsigned)
AST (SGOT)    Date Value Ref Range Status   05/20/2022 22 8 - 45 U/L Final     ALT (SGPT)   Date Value Ref Range Status   05/20/2022 33 10 - 55 U/L Final     CREATININE   Date Value Ref Range Status   05/20/2022 1.10 0.75 - 1.35 mg/dL Final       COMPLETE BLOOD COUNT   Lab Results   Component Value Date    WBC 5.5 12/06/2021    HGB 15.2 12/06/2021    HCT 44.9 12/06/2021    PLTCNT 236 12/06/2021       DIFFERENTIAL  Lab Results   Component Value Date    PMNS 48 12/06/2021    LYMPHOCYTES 37 12/06/2021    MONOCYTES 10 12/06/2021    EOSINOPHIL 4 12/06/2021    BASOPHILS 1 12/06/2021    NRBCS 0 09/09/2011    PMNABS 3.82 10/07/2015    LYMPHSABS 2.09 10/07/2015    EOSABS 0.13 10/07/2015    MONOSABS 0.56 10/07/2015    BASOSABS 0.056 09/28/2014          Last visit: 10/2021  Next visit: 10/2022    Tonna Corner, CMA  10/29/2022, 12:13

## 2022-11-02 ENCOUNTER — Other Ambulatory Visit: Payer: Self-pay

## 2022-11-02 ENCOUNTER — Ambulatory Visit: Payer: BC Managed Care – PPO | Attending: Surgical | Admitting: Surgical

## 2022-11-02 VITALS — BP 130/78 | HR 89 | Ht 69.0 in | Wt 215.8 lb

## 2022-11-02 DIAGNOSIS — L4050 Arthropathic psoriasis, unspecified: Secondary | ICD-10-CM | POA: Insufficient documentation

## 2022-11-02 DIAGNOSIS — Z79899 Other long term (current) drug therapy: Secondary | ICD-10-CM | POA: Insufficient documentation

## 2022-11-02 DIAGNOSIS — M67921 Unspecified disorder of synovium and tendon, right upper arm: Secondary | ICD-10-CM | POA: Insufficient documentation

## 2022-11-02 NOTE — Progress Notes (Signed)
Subjective  Chase [name] is a 56 y.o. year old male who presents to clinic for Psoriatic Arthritis follow up. He was last seen on 11/03/21. At that time, he was continued on Humira 40 mg SQ Q14d.    In the interim, patient reports feeling pretty good except for his R elbow having pain and swelling of the joint. He continues Humira 40 mg SQ Q14 days and is tolerating this well with effective control of his symptoms. His colonoscopy completed recently was unremarkable. He uses Ibuprofen occasionally and feels that this helps his right elbow.     Patient denies recent infections or cough.        Previous Visit:  Patient states that he has remained stable since last visit. He continues to endorse pain in his lower back, BL knees (R>L), and BL ankles with occasional swelling observed to the R knee and both ankles. Has morning stiffness lasting about 30-45 minutes. His pain is typically worsened by activity as he has a very active job that requires bending and lifting. He tried Voltaren gel but states that it offered little to no improvement to his symptoms.      He continues to take Humira as prescribed and feels that the medication has effectively controlled his symptoms. He has not had a psoriasis flare in several years. He denies hot, swollen, or painful joints. Overall, he feels that he has more good days than bad and is satisfied with his current medication regimen.    ROS:  All other systems reviewed in detail are negative, except as noted.    Current Outpatient Medications   Medication Sig    amLODIPine (NORVASC) 10 mg Oral Tablet Take 1 Tablet (10 mg total) by mouth Once a day    Blood Sugar Diagnostic (ONETOUCH VERIO) Strip 1 Strip by In Vitro route Three times a day    Blood-Glucose Meter (ONETOUCH VERIO IQ METER) Misc 1 Each by In Vitro route Three times a day    enalapril (VASOTEC) 20 mg Oral Tablet Take 1 Tablet (20 mg total) by mouth Once a day    glipiZIDE (GLUCOTROL XL) 10 mg Oral Tablet Extended Rel  24 hr Take 1 Tablet (10 mg total) by mouth Every morning with breakfast    HUMIRA,CF, PEN 40 mg/0.4 mL Subcutaneous Pen Injector Kit INJECT 40 MG UNDER THE SKIN EVERY 14 DAYS    lancets (ONETOUCH SURESOFT LANCING DEV) 28 gauge Misc 1 Each by In Vitro route Three times a day    metoprolol succinate (TOPROL-XL) 50 mg Oral Tablet Sustained Release 24 hr Take 1 Tablet (50 mg total) by mouth Once a day    rosuvastatin (CRESTOR) 20 mg Oral Tablet Take 1 Tablet (20 mg total) by mouth Every evening    tadalafil (CIALIS) 5 mg Oral Tablet TAKE AS DIRECTED BY PRESCRIBER OR PACKAGE INSTRUCTIONS     Objective  BP 130/78   Pulse 89   Ht 1.753 m (_0 )   Wt 97.9 kg (215 lb 13.3 oz)   SpO2 96%   BMI 31.87 kg/m   General: No acute distress. Appears stated age.  Eyes: Conjunctiva clear. No scleral icterus    MSK: small   Skin: No facial rash, no psoriasis.    Assessment/Plan  Assessment/Plan   1. Psoriatic arthritis (CMS [place])    2. High risk medication use    3. Tendinopathy of elbow, right        Chase [name] is a 56 y.o. male  here for Psoriatic Arthritis follow up. Currently on Humira Q14d which he has been taking since 2013. Stable disease activity with no active synovitis on exam.    Plan:  -Continue Humira as prescribed. Discussed with the patient that we can consider alternative agents in the future if his symptoms worsen  -will utilize OTC NSAID twice daily for at least 2 weeks for right elbow    RTC in 1 year for F/U; or sooner, if needed.    No orders of the defined types were placed in this encounter.    I am scribing for, and in the presence of, Milton Ferguson [place], [place], for services provided on 11/02/2022.  // Graciela Husbands, [place] 11/02/2022, 10:51    I personally performed the services described in this documentation, as scribed  in my presence, and it is both accurate  and complete.      [place] PA-C  [facility] Medicine  Rheumatology

## 2022-11-08 ENCOUNTER — Other Ambulatory Visit (INDEPENDENT_AMBULATORY_CARE_PROVIDER_SITE_OTHER): Payer: Self-pay | Admitting: Family Medicine

## 2022-11-08 DIAGNOSIS — E1100 Type 2 diabetes mellitus with hyperosmolarity without nonketotic hyperglycemic-hyperosmolar coma (NKHHC): Secondary | ICD-10-CM

## 2022-11-08 DIAGNOSIS — I10 Essential (primary) hypertension: Secondary | ICD-10-CM

## 2022-11-11 DIAGNOSIS — E1169 Type 2 diabetes mellitus with other specified complication: Secondary | ICD-10-CM | POA: Diagnosis not present

## 2022-11-11 DIAGNOSIS — E7800 Pure hypercholesterolemia, unspecified: Secondary | ICD-10-CM | POA: Diagnosis not present

## 2022-11-11 DIAGNOSIS — I10 Essential (primary) hypertension: Secondary | ICD-10-CM | POA: Diagnosis not present

## 2022-11-12 DIAGNOSIS — M459 Ankylosing spondylitis of unspecified sites in spine: Secondary | ICD-10-CM | POA: Diagnosis not present

## 2022-11-17 ENCOUNTER — Encounter (HOSPITAL_BASED_OUTPATIENT_CLINIC_OR_DEPARTMENT_OTHER): Payer: Self-pay

## 2022-11-20 ENCOUNTER — Other Ambulatory Visit: Payer: BC Managed Care – PPO | Attending: Family Medicine | Admitting: Family Medicine

## 2022-11-20 ENCOUNTER — Other Ambulatory Visit (INDEPENDENT_AMBULATORY_CARE_PROVIDER_SITE_OTHER): Payer: BC Managed Care – PPO

## 2022-11-20 ENCOUNTER — Encounter (INDEPENDENT_AMBULATORY_CARE_PROVIDER_SITE_OTHER): Payer: Self-pay | Admitting: Family Medicine

## 2022-11-20 ENCOUNTER — Ambulatory Visit (INDEPENDENT_AMBULATORY_CARE_PROVIDER_SITE_OTHER): Payer: BC Managed Care – PPO | Admitting: Family Medicine

## 2022-11-20 ENCOUNTER — Other Ambulatory Visit (INDEPENDENT_AMBULATORY_CARE_PROVIDER_SITE_OTHER): Payer: Self-pay

## 2022-11-20 ENCOUNTER — Other Ambulatory Visit: Payer: Self-pay

## 2022-11-20 VITALS — BP 130/75 | HR 76 | Temp 98.9°F | Resp 16 | Ht 69.0 in | Wt 214.4 lb

## 2022-11-20 DIAGNOSIS — Z125 Encounter for screening for malignant neoplasm of prostate: Secondary | ICD-10-CM

## 2022-11-20 DIAGNOSIS — E7800 Pure hypercholesterolemia, unspecified: Secondary | ICD-10-CM

## 2022-11-20 DIAGNOSIS — I10 Essential (primary) hypertension: Secondary | ICD-10-CM

## 2022-11-20 DIAGNOSIS — K219 Gastro-esophageal reflux disease without esophagitis: Secondary | ICD-10-CM

## 2022-11-20 DIAGNOSIS — Z794 Long term (current) use of insulin: Secondary | ICD-10-CM

## 2022-11-20 DIAGNOSIS — E1100 Type 2 diabetes mellitus with hyperosmolarity without nonketotic hyperglycemic-hyperosmolar coma (NKHHC): Secondary | ICD-10-CM

## 2022-11-20 DIAGNOSIS — I2510 Atherosclerotic heart disease of native coronary artery without angina pectoris: Secondary | ICD-10-CM

## 2022-11-20 DIAGNOSIS — M7711 Lateral epicondylitis, right elbow: Secondary | ICD-10-CM

## 2022-11-20 DIAGNOSIS — L4050 Arthropathic psoriasis, unspecified: Secondary | ICD-10-CM

## 2022-11-20 DIAGNOSIS — Z1211 Encounter for screening for malignant neoplasm of colon: Secondary | ICD-10-CM

## 2022-11-20 LAB — COMPREHENSIVE METABOLIC PNL, FASTING
ALBUMIN: 4.3 g/dL (ref 3.5–5.0)
ALKALINE PHOSPHATASE: 61 U/L (ref 45–115)
ALT (SGPT): 47 U/L (ref 10–55)
ANION GAP: 8 mmol/L (ref 4–13)
AST (SGOT): 28 U/L (ref 8–45)
BILIRUBIN TOTAL: 0.8 mg/dL (ref 0.3–1.3)
BUN/CREA RATIO: 12 (ref 6–22)
BUN: 14 mg/dL (ref 8–25)
CALCIUM: 9.9 mg/dL (ref 8.6–10.2)
CHLORIDE: 103 mmol/L (ref 96–111)
CO2 TOTAL: 27 mmol/L (ref 22–30)
CREATININE: 1.19 mg/dL (ref 0.75–1.35)
ESTIMATED GFR - MALE: 71 mL/min/BSA (ref >=60)
GLUCOSE: 188 mg/dL — ABNORMAL HIGH (ref 70–99)
POTASSIUM: 4.5 mmol/L (ref 3.5–5.1)
PROTEIN TOTAL: 8.2 g/dL (ref 6.4–8.3)
SODIUM: 138 mmol/L (ref 136–145)

## 2022-11-20 LAB — MICROALBUMIN/CREATININE RATIO, URINE, RANDOM
CREATININE RANDOM URINE: 171 mg/dL — ABNORMAL HIGH (ref 50–100)
MICROALBUMIN RANDOM URINE: 1.2 mg/dL
MICROALBUMIN/CREATININE RATIO RANDOM URINE: 7 mg/g (ref <30.0)

## 2022-11-20 LAB — POCT HGB A1C: POCT HGB A1C: 8.7 % — AB (ref 4–6)

## 2022-11-20 LAB — LIPID PANEL
CHOL/HDL RATIO: 2.5
CHOLESTEROL: 119 mg/dL (ref 100–200)
HDL CHOL: 48 mg/dL — ABNORMAL LOW (ref >=50)
LDL CALC: 46 mg/dL (ref <100)
NON-HDL: 71 mg/dL (ref <=190)
TRIGLYCERIDES: 146 mg/dL (ref <150)
VLDL CALC: 20 mg/dL (ref <30)

## 2022-11-20 NOTE — Progress Notes (Signed)
[name]  [address]   [place], [place]  [address]  Dept. Phone : [phone]  Dept. Fax: [phone]          Chase [name]  22-Jan-1966  [id number]    Date of Service: 11/20/2022  9:45 AM EST    Chief complaint:   Chief Complaint   Patient presents with    Follow Up 6 Months     fasting   right elbow pain     Diabetes    Hypercholesterolemia, Pure       The patient is here for follow up of their chronic conditions.  He has been doing pretty well.  He admits his diet has not been good the holidays.  He is going to get back exercising and trying to get his blood sugars back down.      He is complaining of some pain of his right elbow.  It happened little before Christmas.  He does not report any distinct injury to the area.  He does have a history of psoriatic arthritis and isn't sure if it could be a flare of that or perhaps tennis elbow.    Subjective:     This is a case of a 57 y.o. year old male who comes in today for a routine follow up .         The patient's chronic conditions are stable and unchanged . See Problem List for chronic conditions, which have been reviewed today.       Current Outpatient Medications   Medication Sig    amLODIPine (NORVASC) 10 mg Oral Tablet Take 1 Tablet (10 mg total) by mouth Once a day    Blood Sugar Diagnostic (ONETOUCH VERIO) Strip 1 Strip by In Vitro route Three times a day    Blood-Glucose Meter (ONETOUCH VERIO IQ METER) Misc 1 Each by In Vitro route Three times a day    enalapril (VASOTEC) 20 mg Oral Tablet Take 1 Tablet (20 mg total) by mouth Once a day    glipiZIDE (GLUCOTROL XL) 10 mg Oral Tablet Extended Rel 24 hr Take 1 Tablet (10 mg total) by mouth Every morning before breakfast    HUMIRA,CF, PEN 40 mg/0.4 mL Subcutaneous Pen Injector Kit INJECT 40 MG UNDER THE SKIN EVERY 14 DAYS    lancets (ONETOUCH SURESOFT LANCING DEV) 28 gauge Misc 1 Each by In Vitro route Three times a day    metoprolol succinate (TOPROL-XL) 50 mg Oral Tablet Sustained  Release 24 hr Take 1 Tablet (50 mg total) by mouth Once a day    rosuvastatin (CRESTOR) 20 mg Oral Tablet Take 1 Tablet (20 mg total) by mouth Every evening    tadalafil (CIALIS) 5 mg Oral Tablet TAKE AS DIRECTED BY PRESCRIBER OR PACKAGE INSTRUCTIONS     Patient Active Problem List   Diagnosis    Essential hypertension    Gastroesophageal reflux disease without esophagitis    Urinary calculus, unspecified    Psoriatic arthritis (CMS HCC)    Fracture of distal end of tibia    Fracture of distal fibula    Coronary artery disease involving native coronary artery without angina pectoris    Diabetes mellitus, type 2 (CMS HCC)    Back pain    Pure hypercholesterolemia    Worsening headaches    Screening PSA (prostate specific antigen)    Screen for colon cancer    History of kidney stones    Right shoulder pain  Tubular adenoma of colon     Past Surgical History:   Procedure Laterality Date    HX APPENDECTOMY      2017    HX CARPAL [place]    bil    HX HEART CATHETERIZATION  11/09    HX HERNIA REPAIR  1970    2010    LITHOTRIPSY  4315    PB LAP UMBILICAL HERNIA REPAIR  2009         Family Medical History:       Problem Relation (Age of Onset)    Cancer Father    Diabetes Mother, Father    Hypertension (High Blood Pressure) Mother            Social History     Socioeconomic History    Marital status: Married    Number of children: 3   Tobacco Use    Smoking status: Former     Types: Cigars    Smokeless tobacco: Former     Types: Snuff     Quit date: 10/26/2019    Tobacco comments:     3-4 a week   Vaping Use    Vaping Use: Never used   Substance and Sexual Activity    Alcohol use: Yes     Comment: occassionally    Drug use: No     Social Determinants of Health     Social Connections: Low Risk  (10/15/2022)    Social Connections     SDOH Social Isolation: 5 or more times a week     Past Surgical History:   Procedure Laterality Date    COLONOSCOPY N/A 10/23/2016    Performed by Melida Quitter, MD at [facility] N/A 10/15/2022    Performed by Burgess Amor, MD at [facility]      2017    [place]    bil    [place]  11/09    [place]    2010    LITHOTRIPSY  4008    PB LAP UMBILICAL HERNIA REPAIR  2009         REVIEW OF SYSTEMS:  General: (-) fevers (-) chills. (-) weight loss. (-) fatigue.  Lymphatic: (-) palpable masses. (-) night sweats.  Heme: (-) easy bruising (-) bleeding. (-) recurrent infections.   HEENT. (-) vision changes (-) hearing changes. (-) dysphagia. (-) sore throat.   Heart: (-) chest pain. (-) palpitation. (-) orthopnea. (-) LE edema.   Lungs: (-) dyspnea (on exertion) (-) hemoptysis. (-) cough.   Abdomen: (-) poor appetite. (-) abdominal pain. (-) nausea (-) vomiting. (-) diarrhea. (-) constipation.   GU: (-) dysuria (-) Urgency. (-) Hematuria.   MS. (+) joint pain (-) ext swelling. (-) Back pain.   Dermatologic: (-) rashes. (-) pruritus.   Psychiatric: (-) Depression. (-) anxiety. (-) insomnia.   Neurologic: (-) headaches. (-) neuropathy. (-) weakness. (-) memory problems.              Objective:     BP 130/75   Pulse 76   Temp 37.2 C (98.9 F)   Resp 16   Ht 1.753 m ('5\' 9"'$ )   Wt 97.3 kg (214 lb 6.4 oz)   SpO2 96%   BMI 31.66 kg/m         General appearance: alert, oriented x 3, in his normal state,  cooperative, not in apparent distress, appearing stated age   Integumentary:   Overall examination of the patient's skin reveals- no rashes, no suspicious lesions, and no bruises. Normal coloration of skin. Normal skin moisture.  Head and Neck:  Normocephalic and atraumatic. No lesions or palpable masses. Neck is supple with full ROM. No bruit auscultated on the left or right. No nucchal rigidity. No lymphadenopathy. Thyroid:  Normal size and consistency, no palpable nodules, normal position and symmetric. Non tender.   Lungs: Respirations are non labored. Patient has no conversational dyspnea. Lungs are  clear to ausculation .    Heart: regular rate and rhythm, S1, S2 normal, no murmur  Abdomen: soft, non-tender. Bowel sounds normal. No abnormal pulsations. No rigidity and no hepatosplenomegaly . Normal active bowel sounds in all quadrants.   Extremities: extremities normal, atraumatic, no cyanosis or edema, pulses intact in upper and lower extremities.  No varicose ulcers.  He is tender over his right lateral epicondyle.  There has no air warmth.  He has pain with extension of his right hand against resistance.     Psych :  Patient is alert and oriented and cooperative with the exam. No evidence of hallucinations, delusions or homicidal/suicidal ideations.   Neuro:  Cranial Nerves 2-12 are grossly intact . Coordination is normal . Gait is normal.  No meningeal signs.     Assessment :       PHQ Questionnaire  Little interest or pleasure in doing things.: Not at all  Feeling down, depressed, or hopeless: Not at all  PHQ 2 Total: 0        ICD-10-CM    1. Type 2 diabetes mellitus with hyperosmolarity without coma, without long-term current use of insulin (CMS HCC)  E11.00 POCT HGB A1C     LIPID PANEL     COMPREHENSIVE METABOLIC PNL, FASTING     MICROALBUMIN/CREATININE RATIO, URINE, RANDOM    a1c 7.4% 05-20-22      2. Coronary artery disease involving native coronary artery without angina pectoris  I25.10       3. Essential hypertension  I10       4. Pure hypercholesterolemia  E78.00       5. Gastroesophageal reflux disease without esophagitis  K21.9       6. Screen for colon cancer  Z12.11     10-15-22      7. Screening PSA (prostate specific antigen)  Z12.5     12-06-21 result 0.9      8. Psoriatic arthritis (CMS HCC)  L40.50             PLAN : Diabetes Monitors  A1C: 8.7  A1C Date: 11/20/2022  Kidney Health:   Urine Microalbumin/Cr Ratio--3.9 Ur Microalb/Cr Ratio date--12/06/2021   eGFR --79 eGFR date--05/20/2022    Last Lipid Panel  (Last result in the past 2 years)        Cholesterol   HDL   LDL   Direct LDL    Triglycerides      05/20/22 1053 124   49   51  Comment: <100 mg/dL, Optimal  100-129 mg/dL, Near/Above Optimal  130-159 mg/dL, Borderline High  160-189 mg/dL, High  >=190 mg/dL, Very high     139            Retinal Exam Date: Not Found    Last Foot Exam: Not Found    His A1c is up to 8.7 today.  Will continue his glipizide.  He says he is  going to get to diet and exercising.  This should help him get it down.  He is gotten it down with in the past.      Continue current treatment regimen. Patient is doing well. Patient is to call with any problems prior to next appointment if needed.    Will draw fasting labs today.     He is a little early for his yearly PSA test.  He has an appointment coming up with Urology.  Will defer to them.      His blood pressure is well controlled.  Will continue current medications he is on an ACE-inhibitor.      We gave him a brace to use for his right lateral epicondylitis.  I told him it has usually an overuse injury.  I told to try right resting the area, icing and use a splint during the day.  If it does not improve he is to let me know and I told him I could refer him to ortho for a steroid injection.      He follows with rheumatology for his psoriatic arthritis in his stable at this time.  He is on Humira.      Will plan to see him back in 3 months for follow-up sooner if needed.  Hopefully his blood sugars will be better controlled that point            No follow-ups on file.     Depression screening is negative. PHQ 2 Total: 0                 PHQ Questionnaire  Little interest or pleasure in doing things.: Not at all  Feeling down, depressed, or hopeless: Not at all  PHQ 2 Total: 0      The patient was given ample opportunity to ask questions and those questions were answered to the patient's satisfaction. The patient was encouraged to be involved in their own care, and all diagnoses, medications, and medication side-effects were discussed.  A copy of the patient's medication  list was printed and given to the patient. A good faith effort was made to reconcile the patient's medications.  The patient is aware that they are to contact me with any additional questions or concerns, or go to the ED in an emergency.     Future Appointments   Date Time Provider [place]   04/14/2023 11:00 AM Alvia Grove, NP [id number] T2   11/04/2023 10:00 AM [place], [place] Ferguson, [place] Maintenance Due   Topic Date Due    Hepatitis B Vaccine (1 of 3 - 3-dose series) Never done    Covid-19 Vaccine (1) Never done    HIV Screening  Never done    Adult Tdap-Td (1 - Tdap) Never done    Shingles Vaccine (1 of 2) Never done    Pneumococcal Vaccine, Age 22-64 (2 - PCV) 10/31/2005    Diabetic Retinal Exam  12/12/2021    Influenza Vaccine (1) 07/17/2022    Diabetic Kidney Health Microalb/Cr Ratio  12/06/2022     Health Maintenance   Topic Date Due    Hepatitis B Vaccine (1 of 3 - 3-dose series) Never done    Covid-19 Vaccine (1) Never done    HIV Screening  Never done    Adult Tdap-Td (1 - Tdap) Never done    Shingles Vaccine (1 of 2) Never done    Pneumococcal Vaccine, Age  0-64 (2 - PCV) 10/31/2005    Diabetic Retinal Exam  12/12/2021    Influenza Vaccine (1) 07/17/2022    Diabetic Kidney Health Microalb/Cr Ratio  12/06/2022    Diabetic Kidney Health eGFR  05/21/2023    Diabetic A1C  05/21/2023    Depression Screening  11/21/2023    Prostate Cancer Screening  12/07/2023    Colonoscopy  10/16/2027    Meningococcal Vaccine  Aged Out                 This note was partially generated using MModal Fluency Direct system, and there may be some incorrect words, spellings, and punctuation that were not noted in checking the note before saving.    Dwyane Luo, DO    [facility]  [address]   [place], [place]  [address]  Dept. Phone  /  [phone]  Dept Fax / [phone]

## 2022-11-20 NOTE — Progress Notes (Signed)
Venipuncture was performed in office and patient tolerated it well.  Dry dressing was applied to the site and pressure was applied.  Patient also completed urine sample.  Specimens were labeled and prepped for transport to [facility] lab.

## 2022-12-08 ENCOUNTER — Other Ambulatory Visit (INDEPENDENT_AMBULATORY_CARE_PROVIDER_SITE_OTHER): Payer: Self-pay | Admitting: Family Medicine

## 2022-12-08 ENCOUNTER — Telehealth (INDEPENDENT_AMBULATORY_CARE_PROVIDER_SITE_OTHER): Payer: Self-pay | Admitting: Family Medicine

## 2022-12-08 MED ORDER — SITAGLIPTIN PHOSPHATE 100 MG TABLET
100.0000 mg | ORAL_TABLET | Freq: Every day | ORAL | 4 refills | Status: DC
Start: 2022-12-08 — End: 2023-01-01

## 2022-12-08 NOTE — Telephone Encounter (Signed)
Pt called back to let Dr Madelin Headings know that this morning his blood sugar is 271. He stated all he had this morning was wheat toast and black coffee. Rosemarie Beath, [place]

## 2022-12-08 NOTE — Telephone Encounter (Signed)
patients wife called in and the patient has been c/o his glucose running elevated the past 4 days. His numbers have been 175 -220. He states he is a little nauseated and has a slight headache. He has not been on any steroids or antibiotics that could have raise it. Only change is oranges from [employer]. He takes his '10mg'$  Glipizide every morning. He is wanting to know what he can do to lower the glucose.

## 2022-12-08 NOTE — Telephone Encounter (Signed)
Pt's wife called back stating that on Friday he had a bowl of Trix cereal. Rosemarie Beath, [place]

## 2022-12-08 NOTE — Telephone Encounter (Signed)
patients wife has been notified and informed.

## 2022-12-08 NOTE — Telephone Encounter (Signed)
patient has been on metformin in the past and the pts wife believes it caused him to have diarrhea.

## 2022-12-10 DIAGNOSIS — M459 Ankylosing spondylitis of unspecified sites in spine: Secondary | ICD-10-CM | POA: Diagnosis not present

## 2022-12-11 ENCOUNTER — Ambulatory Visit (HOSPITAL_BASED_OUTPATIENT_CLINIC_OR_DEPARTMENT_OTHER): Payer: Self-pay | Admitting: Medical

## 2022-12-28 ENCOUNTER — Telehealth (INDEPENDENT_AMBULATORY_CARE_PROVIDER_SITE_OTHER): Payer: Self-pay | Admitting: Family Medicine

## 2022-12-28 NOTE — Telephone Encounter (Signed)
spoke with the pt and is unable to come in tomorrow. appt made for Friday.

## 2022-12-28 NOTE — Telephone Encounter (Signed)
Pt called stating that his medications are not controlling his blood sugar. He stated that this morning his fasting blood sugar was 99. He had a cup of coffee and a piece of wheat toast with a minimal amount of butter. He stated that yesterday his blood sugar went up to 268 after lunch and all he had was [place] breast, vegetables, and six wheat crackers. Rosemarie Beath, [place]

## 2023-01-01 ENCOUNTER — Ambulatory Visit (INDEPENDENT_AMBULATORY_CARE_PROVIDER_SITE_OTHER): Payer: BC Managed Care – PPO | Admitting: Family Medicine

## 2023-01-01 ENCOUNTER — Other Ambulatory Visit (INDEPENDENT_AMBULATORY_CARE_PROVIDER_SITE_OTHER): Payer: Self-pay | Admitting: Family Medicine

## 2023-01-01 ENCOUNTER — Encounter (INDEPENDENT_AMBULATORY_CARE_PROVIDER_SITE_OTHER): Payer: Self-pay | Admitting: Family Medicine

## 2023-01-01 ENCOUNTER — Other Ambulatory Visit: Payer: Self-pay

## 2023-01-01 VITALS — BP 125/70 | HR 67 | Temp 98.6°F | Resp 16 | Ht 69.0 in | Wt 212.1 lb

## 2023-01-01 DIAGNOSIS — E1100 Type 2 diabetes mellitus with hyperosmolarity without nonketotic hyperglycemic-hyperosmolar coma (NKHHC): Secondary | ICD-10-CM

## 2023-01-01 DIAGNOSIS — IMO0002 Reserved for concepts with insufficient information to code with codable children: Secondary | ICD-10-CM

## 2023-01-01 DIAGNOSIS — E119 Type 2 diabetes mellitus without complications: Secondary | ICD-10-CM

## 2023-01-01 MED ORDER — MOUNJARO 2.5 MG/0.5 ML SUBCUTANEOUS PEN INJECTOR
2.5000 mg | PEN_INJECTOR | SUBCUTANEOUS | 0 refills | Status: DC
Start: 2023-01-01 — End: 2023-02-03

## 2023-01-01 NOTE — Progress Notes (Signed)
[facility]  [address]   [place], [place]  [address]  Dept. Phone : [phone]  Dept. Fax: [phone]          Chase [name]  1966-10-19  [id number]    Date of Service: 01/01/2023 10:45 AM EST    Chief complaint:   Chief Complaint   Patient presents with    Elevated Glucose     brought readings        He called in recently saying that his blood sugars were running high.  They are running often in the 200s in the morning.  There through the rest of the day they look to be pretty good.  He said that he is really modified his diet recently and his wife has been cooking diabetic recipes for them.  He has been on glipizide and Januvia.  He tried metformin in the past and it caused him to have diarrhea.  He had prefer not to go back on that.      Patient Active Problem List   Diagnosis    Essential hypertension    Gastroesophageal reflux disease without esophagitis    Urinary calculus, unspecified    Psoriatic arthritis (CMS HCC)    Fracture of distal end of tibia    Fracture of distal fibula    Coronary artery disease involving native coronary artery without angina pectoris    Diabetes mellitus, type 2 (CMS HCC)    Back pain    Pure hypercholesterolemia    Worsening headaches    Screening PSA (prostate specific antigen)    Screen for colon cancer    History of kidney stones    Right shoulder pain    Tubular adenoma of colon     Past Surgical History:   Procedure Laterality Date    COLONOSCOPY N/A 10/23/2016    Performed by Melida Quitter, MD at [place] N/A 10/15/2022    Performed by Burgess Amor, MD at [place]      2017    [place]    bil    HX HEART CATHETERIZATION  11/09    [place]    2010    LITHOTRIPSY  [id number]    PB LAP UMBILICAL HERNIA REPAIR  2009     Family Medical History:       Problem Relation (Age of Onset)    Cancer Father    Diabetes Mother, Father    Hypertension (High Blood Pressure) Mother             Social History     Socioeconomic History    Marital status: Married    Number of children: 3   Tobacco Use    Smoking status: Former     Types: Cigars    Smokeless tobacco: Former     Types: Snuff     Quit date: 10/26/2019    Tobacco comments:     3-4 a week   Vaping Use    Vaping Use: Never used   Substance and Sexual Activity    Alcohol use: Yes     Comment: occassionally    Drug use: No     Social Determinants of Health     Social Connections: Low Risk  (10/15/2022)    Social Connections     SDOH Social Isolation: 5 or more times a week  Current Outpatient Medications   Medication Sig    amLODIPine (NORVASC) 10 mg Oral Tablet Take 1 Tablet (10 mg total) by mouth Once a day    Blood Sugar Diagnostic (ONETOUCH VERIO) Strip 1 Strip by In Vitro route Three times a day    Blood-Glucose Meter (ONETOUCH VERIO IQ METER) Misc 1 Each by In Vitro route Three times a day    enalapril (VASOTEC) 20 mg Oral Tablet Take 1 Tablet (20 mg total) by mouth Once a day    glipiZIDE (GLUCOTROL XL) 10 mg Oral Tablet Extended Rel 24 hr Take 1 Tablet (10 mg total) by mouth Every morning before breakfast    HUMIRA,CF, PEN 40 mg/0.4 mL Subcutaneous Pen Injector Kit INJECT 40 MG UNDER THE SKIN EVERY 14 DAYS    lancets (ONETOUCH SURESOFT LANCING DEV) 28 gauge Misc 1 Each by In Vitro route Three times a day    metoprolol succinate (TOPROL-XL) 50 mg Oral Tablet Sustained Release 24 hr Take 1 Tablet (50 mg total) by mouth Once a day    rosuvastatin (CRESTOR) 20 mg Oral Tablet Take 1 Tablet (20 mg total) by mouth Every evening    tadalafil (CIALIS) 5 mg Oral Tablet TAKE AS DIRECTED BY PRESCRIBER OR PACKAGE INSTRUCTIONS    tirzepatide (MOUNJARO) 2.5 mg/0.5 mL Subcutaneous Pen Injector Inject 0.5 mL (2.5 mg total) under the skin Every 7 days       Subjective:       ROS:  General: Denies fevers or chills  Lung: Denies dyspnea or cough  CV: Denies chest pain, palpitations  Abdomen: Denies abdominal pain or change in bowel habits  Neuro:  Denies weakness or dizziness  Extremities: Denies swelling or claudication      Objective:     BP 125/70   Pulse 67   Temp 37 C (98.6 F)   Resp 16   Ht 1.753 m (5' 9"$ )   Wt 96.2 kg (212 lb 1.3 oz)   SpO2 98%   BMI 31.32 kg/m       Body mass index is 31.32 kg/m.        General appearance: alert, oriented x 3, in his normal state, cooperative, not in apparent distress, appears stated age . Patient is cooperative with the exam.   Lungs: clear to auscultation bilaterally, respirations non-labored  Heart: regular rate and rhythm, S1, S2 normal, no murmur.  Abdomen: soft, non-tender. Bowel sounds normal. Non distended. No hepatosplenomegaly .   Extremities: extremities normal, atraumatic, no cyanosis or edema, pulses intact in upper and lower extremities    Assessment/Plan         PHQ Questionnaire             ICD-10-CM    1. Diabetes mellitus (CMS HCC)  E11.9 tirzepatide (MOUNJARO) 2.5 mg/0.5 mL Subcutaneous Pen Injector          We discussed a diabetic diet today.  I told him he should try to stay around 2000 calories.  He also is interested in starting [place].The patient has expressed interest in taking a GLP-1 agonist medication. The patient denies any personal or family history of thyroid cancer. I explained the mechanism of action of the GLP-1 agonist class of medications. I explained that it is a hormone analog that only works after eating. It slows gastric motility , leading to earlier satiety. Patients sometimes experience nausea as well. I did warn of the potential for pancreatitis and told the patient warning signs of pancreatitis. I explained that it  is a weekly injection with the dosage increasing over time. I told the patient that using this class of medications for weight loss is relatively new and the length of time that a patient will need to use the medication is not definitive. I told them that it may need to be a lifetime . I also warned them of the possibility of weight gain after  stopping the medication.  I answered the patients questions to their satisfaction today . I encouraged the patient to call me with any problems or questions that may arise after starting the medication. The patient is agreeable to starting the medication with the above information .   I gave him a starter pack of the medication.  I also sent him in a prescription for the 2.5 mg dosage.  Will see how he does with that and continue his glipizide.  I told him the glipizide can cause hypoglycemia which could worsen with the addition of the medication.  He understands to stop his Januvia at this point.      Will see him back in 2 months.  He is call with any problems prior to that time    Return in about 2 months (around 03/02/2023).                               The patient was given ample opportunity to ask questions and those questions were answered to the patient's satisfaction. The patient was encouraged to be involved in their own care, and all diagnoses, medications, and medication side-effects were discussed.  A copy of the patient's medication list was printed and given to the patient. A good faith effort was made to reconcile the patient's medications.  The patient is aware that they are to contact me with any additional questions or concerns, or go to the ED in an emergency situation.           This note was partially generated using MModal Fluency Direct system, and there may be some incorrect words, spellings, and punctuation that were not noted in checking the note before saving.    Dwyane Luo, D.O.    [facility]  [address]   [place], [place]  [address]  Dept. Phone  /  [phone]  Dept Fax / [phone]

## 2023-01-04 ENCOUNTER — Other Ambulatory Visit (INDEPENDENT_AMBULATORY_CARE_PROVIDER_SITE_OTHER): Payer: Self-pay | Admitting: Family Medicine

## 2023-01-04 MED ORDER — ONETOUCH VERIO TEST STRIPS
1.0000 | ORAL_STRIP | Freq: Three times a day (TID) | 11 refills | Status: DC
Start: 2023-01-04 — End: 2023-10-29

## 2023-01-04 MED ORDER — LANCETS 28 GAUGE
1.0000 | Freq: Three times a day (TID) | 11 refills | Status: DC
Start: 2023-01-04 — End: 2023-03-15

## 2023-01-04 MED ORDER — BLOOD-GLUCOSE METER
1.0000 | Freq: Three times a day (TID) | 0 refills | Status: AC
Start: 2023-01-04 — End: ?

## 2023-01-04 NOTE — Addendum Note (Signed)
Addended by: Huey Romans on: 01/04/2023 08:43 AM     Modules accepted: Orders

## 2023-01-04 NOTE — Telephone Encounter (Signed)
Please assoc with DM diagnosis

## 2023-01-07 DIAGNOSIS — E1169 Type 2 diabetes mellitus with other specified complication: Secondary | ICD-10-CM | POA: Diagnosis not present

## 2023-01-07 DIAGNOSIS — M461 Sacroiliitis, not elsewhere classified: Secondary | ICD-10-CM | POA: Diagnosis not present

## 2023-01-07 DIAGNOSIS — M459 Ankylosing spondylitis of unspecified sites in spine: Secondary | ICD-10-CM | POA: Diagnosis not present

## 2023-01-07 DIAGNOSIS — E785 Hyperlipidemia, unspecified: Secondary | ICD-10-CM | POA: Diagnosis not present

## 2023-01-20 DIAGNOSIS — H2513 Age-related nuclear cataract, bilateral: Secondary | ICD-10-CM | POA: Diagnosis not present

## 2023-01-20 DIAGNOSIS — H04123 Dry eye syndrome of bilateral lacrimal glands: Secondary | ICD-10-CM | POA: Diagnosis not present

## 2023-01-20 DIAGNOSIS — H524 Presbyopia: Secondary | ICD-10-CM | POA: Diagnosis not present

## 2023-01-20 DIAGNOSIS — E119 Type 2 diabetes mellitus without complications: Secondary | ICD-10-CM | POA: Diagnosis not present

## 2023-01-27 ENCOUNTER — Encounter (INDEPENDENT_AMBULATORY_CARE_PROVIDER_SITE_OTHER): Payer: Self-pay | Admitting: Family Medicine

## 2023-01-28 ENCOUNTER — Encounter (INDEPENDENT_AMBULATORY_CARE_PROVIDER_SITE_OTHER): Payer: Self-pay | Admitting: Family Medicine

## 2023-02-02 ENCOUNTER — Encounter (INDEPENDENT_AMBULATORY_CARE_PROVIDER_SITE_OTHER): Payer: Self-pay | Admitting: Family Medicine

## 2023-02-03 ENCOUNTER — Other Ambulatory Visit (INDEPENDENT_AMBULATORY_CARE_PROVIDER_SITE_OTHER): Payer: Self-pay | Admitting: Family Medicine

## 2023-02-03 MED ORDER — MOUNJARO 5 MG/0.5 ML SUBCUTANEOUS PEN INJECTOR
5.0000 mg | PEN_INJECTOR | SUBCUTANEOUS | 1 refills | Status: DC
Start: 2023-02-03 — End: 2023-03-17

## 2023-02-04 DIAGNOSIS — M459 Ankylosing spondylitis of unspecified sites in spine: Secondary | ICD-10-CM | POA: Diagnosis not present

## 2023-02-19 ENCOUNTER — Encounter (INDEPENDENT_AMBULATORY_CARE_PROVIDER_SITE_OTHER): Payer: Self-pay | Admitting: Family Medicine

## 2023-02-19 DIAGNOSIS — L4050 Arthropathic psoriasis, unspecified: Secondary | ICD-10-CM

## 2023-02-19 DIAGNOSIS — Z1211 Encounter for screening for malignant neoplasm of colon: Secondary | ICD-10-CM

## 2023-02-19 DIAGNOSIS — E1100 Type 2 diabetes mellitus with hyperosmolarity without nonketotic hyperglycemic-hyperosmolar coma (NKHHC): Secondary | ICD-10-CM

## 2023-02-19 DIAGNOSIS — E7800 Pure hypercholesterolemia, unspecified: Secondary | ICD-10-CM

## 2023-02-19 DIAGNOSIS — Z0000 Encounter for general adult medical examination without abnormal findings: Secondary | ICD-10-CM

## 2023-02-19 DIAGNOSIS — I10 Essential (primary) hypertension: Secondary | ICD-10-CM

## 2023-02-19 DIAGNOSIS — Z125 Encounter for screening for malignant neoplasm of prostate: Secondary | ICD-10-CM

## 2023-02-19 DIAGNOSIS — K219 Gastro-esophageal reflux disease without esophagitis: Secondary | ICD-10-CM

## 2023-03-04 DIAGNOSIS — M459 Ankylosing spondylitis of unspecified sites in spine: Secondary | ICD-10-CM | POA: Diagnosis not present

## 2023-03-12 ENCOUNTER — Ambulatory Visit (INDEPENDENT_AMBULATORY_CARE_PROVIDER_SITE_OTHER): Payer: BC Managed Care – PPO | Admitting: Family Medicine

## 2023-03-12 ENCOUNTER — Encounter (INDEPENDENT_AMBULATORY_CARE_PROVIDER_SITE_OTHER): Payer: Self-pay | Admitting: Family Medicine

## 2023-03-12 ENCOUNTER — Other Ambulatory Visit: Payer: Self-pay

## 2023-03-12 VITALS — BP 105/60 | HR 69 | Temp 98.7°F | Resp 16 | Ht 69.0 in | Wt 197.3 lb

## 2023-03-12 DIAGNOSIS — Z87891 Personal history of nicotine dependence: Secondary | ICD-10-CM

## 2023-03-12 DIAGNOSIS — I2510 Atherosclerotic heart disease of native coronary artery without angina pectoris: Secondary | ICD-10-CM

## 2023-03-12 DIAGNOSIS — E1100 Type 2 diabetes mellitus with hyperosmolarity without nonketotic hyperglycemic-hyperosmolar coma (NKHHC): Secondary | ICD-10-CM

## 2023-03-12 DIAGNOSIS — Z0000 Encounter for general adult medical examination without abnormal findings: Secondary | ICD-10-CM

## 2023-03-12 DIAGNOSIS — Z1211 Encounter for screening for malignant neoplasm of colon: Secondary | ICD-10-CM

## 2023-03-12 DIAGNOSIS — Z125 Encounter for screening for malignant neoplasm of prostate: Secondary | ICD-10-CM

## 2023-03-12 DIAGNOSIS — L4050 Arthropathic psoriasis, unspecified: Secondary | ICD-10-CM

## 2023-03-12 DIAGNOSIS — K219 Gastro-esophageal reflux disease without esophagitis: Secondary | ICD-10-CM

## 2023-03-12 DIAGNOSIS — I10 Essential (primary) hypertension: Secondary | ICD-10-CM

## 2023-03-12 DIAGNOSIS — Z794 Long term (current) use of insulin: Secondary | ICD-10-CM

## 2023-03-12 DIAGNOSIS — E7800 Pure hypercholesterolemia, unspecified: Secondary | ICD-10-CM

## 2023-03-12 LAB — POCT HGB A1C: POCT HGB A1C: 6.2 % — AB (ref 4–6)

## 2023-03-12 MED ORDER — ENALAPRIL MALEATE 10 MG TABLET
10.0000 mg | ORAL_TABLET | Freq: Every day | ORAL | Status: DC
Start: 2023-03-12 — End: 2023-03-12

## 2023-03-12 MED ORDER — ENALAPRIL MALEATE 10 MG TABLET
10.0000 mg | ORAL_TABLET | Freq: Every day | ORAL | 3 refills | Status: DC
Start: 2023-03-12 — End: 2023-10-29

## 2023-03-12 NOTE — Progress Notes (Signed)
[facility]  [address]   [place], [place]  [address]  Dept. Phone : [phone]  Dept. Fax: [phone]          Chase [name]  1966/03/04  [id number]    Date of Service: 03/12/2023    Chief complaint:   Chief Complaint   Patient presents with    Annual Exam    Diabetes    Essential Hypertension       HPI:     This is a 57 y.o. year old male who comes in today for a Yearly Physical exam . The patient's chronic conditions are stable and unchanged . See Problem List for chronic conditions, which have been reviewed today.     He has been doing great.  He said that he has been exercising.  He said he is totally revamped his diet.  He is eating much more healthy foods and much less quantity.  He said he is eating healthy supper every night.  He had 1 low blood sugar on the Mounjaro and glipizide.  He said that he had eaten lot of pancakes prior to that.  I told him that likely he spiked his insulin levels and then his sugar fell.  It came back up with him eating some sweets.  He is tolerating the [facility] very well.  He has not having significant nausea.  He said he did a couple days after his 1st dose in that was all.    He had called in about his blood pressure running low.  I had asked him to cut his amlodipine in half.  He said that he was still running low and was symptomatic and stopped taking it altogether.  York Spaniel it still running low.    Patient Active Problem List   Diagnosis    Essential hypertension    Gastroesophageal reflux disease without esophagitis    Urinary calculus, unspecified    Psoriatic arthritis (CMS HCC)    Fracture of distal end of tibia    Fracture of distal fibula    Coronary artery disease involving native coronary artery without angina pectoris    Diabetes mellitus, type 2 (CMS HCC)    Back pain    Pure hypercholesterolemia    Worsening headaches    Screening PSA (prostate specific antigen)    Screen for colon cancer    History of kidney stones    Right shoulder pain     Tubular adenoma of colon     Past Surgical History:   Procedure Laterality Date    HX APPENDECTOMY      2017    HX CARPAL TUNNEL RELEASE  1990    bil    HX HEART CATHETERIZATION  11/09    HX HERNIA REPAIR  1970    2010    LITHOTRIPSY  2004    PB LAP UMBILICAL HERNIA REPAIR  2009         Family Medical History:       Problem Relation (Age of Onset)    Cancer Father    Diabetes Mother, Father    Hypertension (High Blood Pressure) Mother            Social History     Socioeconomic History    Marital status: Married    Number of children: 3   Tobacco Use    Smoking status: Former     Types: Cigars    Smokeless tobacco: Former     Types: Snuff  Quit date: 10/26/2019    Tobacco comments:     3-4 a week   Vaping Use    Vaping status: Never Used   Substance and Sexual Activity    Alcohol use: Yes     Comment: occassionally    Drug use: No     Social Determinants of Health     Social Connections: Low Risk  (10/15/2022)    Social Connections     SDOH Social Isolation: 5 or more times a week     No Known Allergies  Past Surgical History:   Procedure Laterality Date    COLONOSCOPY N/A 10/23/2016    Performed by Donney Rankins, MD at [facility] OR ENDO    COLONOSCOPY DIAGNOSTIC N/A 10/15/2022    Performed by Bunnie Philips, MD at [facility] OR ENDO    HX APPENDECTOMY      2017    HX CARPAL TUNNEL RELEASE  1990    bil    HX HEART CATHETERIZATION  11/09    HX HERNIA REPAIR  1970    2010    LITHOTRIPSY  2004    PB LAP UMBILICAL HERNIA REPAIR  2009       Current Outpatient Medications   Medication Sig    Blood Sugar Diagnostic (ONETOUCH VERIO TEST STRIPS) Strip 1 Strip by In Vitro route Three times a day    Blood-Glucose Meter (ONETOUCH VERIO IQ METER) Misc 1 Each by In Vitro route Three times a day    enalapril maleate (VASOTEC) 10 mg Oral Tablet Take 1 Tablet (10 mg total) by mouth Once a day    glipiZIDE (GLUCOTROL XL) 10 mg Oral Tablet Extended Rel 24 hr Take 1 Tablet (10 mg total) by mouth Every morning before breakfast    HUMIRA,CF, PEN  40 mg/0.4 mL Subcutaneous Pen Injector Kit INJECT 40 MG UNDER THE SKIN EVERY 14 DAYS    lancets (ONETOUCH SURESOFT LANCING DEV) 28 gauge Misc 1 Each by In Vitro route Three times a day    metoprolol succinate (TOPROL-XL) 50 mg Oral Tablet Sustained Release 24 hr Take 1 Tablet (50 mg total) by mouth Once a day    rosuvastatin (CRESTOR) 20 mg Oral Tablet Take 1 Tablet (20 mg total) by mouth Every evening    tadalafil (CIALIS) 5 mg Oral Tablet TAKE AS DIRECTED BY PRESCRIBER OR PACKAGE INSTRUCTIONS    tirzepatide (MOUNJARO) 5 mg/0.5 mL Subcutaneous Pen Injector Inject 0.5 mL (5 mg total) under the skin Every 7 days         REVIEW OF SYSTEMS:  General: (-) fevers (-) chills. (-) weight loss. (-) fatigue.  Lymphatic: (-) palpable masses. (-) night sweats.  Heme: (-) easy bruising (-) bleeding. (-) recurrent infections.   HEENT. (-) vision changes (-) hearing changes. (-) dysphagia. (-) sore throat.   Heart: (-) chest pain. (-) palpitation. (-) orthopnea. (-) LE edema.   Lungs: (-) dyspnea (on exertion) (-) hemoptysis. (-) cough.   Abdomen: (-) poor appetite. (-) abdominal pain. (-) nausea (-) vomiting. (-) diarrhea. (-) constipation.   GU: (-) dysuria (-) Urgency. (-) Hematuria.   MS. (-) joint pain (-) ext swelling. (-) Back pain.   Dermatologic: (-) rashes. (-) pruritus.   Psychiatric: (-) Depression. (-) anxiety. (-) insomnia.   Neurologic: (-) headaches. (-) neuropathy. (-) weakness. (-) memory problems.        Physical Exam:   BP 105/60   Pulse 69   Temp 37.1 C (98.7 F)   Resp 16  Ht 1.753 m (5\' 9" )   Wt 89.5 kg (197 lb 5 oz)   SpO2 99%   BMI 29.14 kg/m     Body mass index is 29.14 kg/m.   General appearance: Cooperative and Consistent with stated age. Not in acute distress. Build & Nutrition - Well nourished. Posture: Normal posture.  HEENT  Head - normocephalic, atraumatic with no lesions or palpable masses.  Eyes: EOMI, PERRLA,   Face: atraumatic  Neck: full range of motion and supple. No bruits,  non-tender and no nuchal rigidity. No lymphadenopathy.  Thyroid: normal size and consistency, no palpable nodules, normal position and symmetric, non-tender    Chest and Lung Exam  Chest and lung - normal excursion with symmetric chest walls, quiet, easy respiratory effort with no use of accessory muscles and normal resonance, no flatness or dullness  Ausculation: Breath sound - normal and clear Adventitious sounds - no adventitious sounds    Cardiovascular  Auscultation: rhythm regular Heart sounds normal heart sounds  Pulses: Normal pedal pulses bilaterally.    Abdomen: Bowel sounds present in all 4 quadrants. Liver and spleen not palpable. Abdomen is soft, without guarding.     Extremities: No edema or swelling noted. Muscle strength grossly normal in bilateral upper and lower extremities.    Skin: No rashes, ulcers, or bruising.    Neurologic  Cranial nerves:   II-XII, grossly normal bilaterally. Coordination - Normal  Gait - normal Meningeal signs - none    Neuropsychiatric  Orientation: Oriented x 3   Mental Status - Alert with no evidence of hallucination., delusions, obsessions or homicidal.suicidal ideation, demonstrated appropriate judgment and insight and attention span and ability to concentrate are normal. The patients mood and affect are normal      Assessment/Plan       PHQ Questionnaire               ICD-10-CM    1. Annual physical exam  Z00.00       2. Coronary artery disease involving native coronary artery without angina pectoris  I25.10       3. Essential hypertension  I10       4. Pure hypercholesterolemia  E78.00       5. Gastroesophageal reflux disease without esophagitis  K21.9       6. Type 2 diabetes mellitus with hyperosmolarity without coma, without long-term current use of insulin (CMS HCC)  E11.00 POCT HGB A1C    a1c 8.7% 11-20-22      7. Psoriatic arthritis (CMS HCC)  L40.50       8. Screen for colon cancer  Z12.11     12-15-21      9. Screening PSA (prostate specific antigen)  Z12.5      11-05-22 result 0.29      10. Hypertension  I10 enalapril maleate (VASOTEC) 10 mg Oral Tablet     DISCONTINUED: enalapril (VASOTEC) 10 mg Oral Tablet            Diabetes Monitors  A1C: 6.2  A1C Date: 03/12/2023  Kidney Health:   Urine Microalbumin/Cr Ratio--7 Ur Microalb/Cr Ratio date--11/20/2022   eGFR --71 eGFR date--11/20/2022    Last Lipid Panel  (Last result in the past 2 years)        Cholesterol   HDL   LDL   Direct LDL   Triglycerides      11/20/22 1015 119   48   46  Comment: <100 mg/dL, Optimal  161-096 mg/dL, Near/Above Optimal  130-159 mg/dL, Borderline High  161-096 mg/dL, High  >=045 mg/dL, Very high     409            Retinal Exam Date: 12/12/2020 retinopathy status not documented  Last Foot Exam: Not Found    He is doing great in regards to his diabetes.  His A1c has come down with dietary changes in the addition of Mounjaro.  At this point he did have a hypoglycemic event.  I think it is because he ate a lot of sugar but I am going to have him hold his glipizide at this point.  Will continue his current dose of the GLP 1 agonist.  I told him if his sugars start to creep up to let me know and will increase his dose of the GLP 1.      Continue current treatment regimen. Patient is doing well. Patient is to call with any problems prior to next appointment if needed.    He had labs done at his last visit.  They were good.  I do not think we need to repeat those at this point.      He follows with Urology and has an appointment coming up soon.  They will check his PSA.      He is up-to-date on screening colonoscopies.    His blood pressure still running low.  I am going to cut his enalapril and half to 10 mg a day.  I sent a new prescription in of that for him.  He will continue to stay off the amlodipine.      He does have a history of coronary artery disease.  Told him I would like to keep him on the beta-blocker and a little bit of the ACE-inhibitor if his blood pressure will allow.      I  congratulated him on his change in eating habits and exercise.  Would like for him to continue.  Will plan to see back 6 months sooner if needed.              PHQ Questionnaire             No follow-ups on file.      The patient was given ample opportunity to ask questions and those questions were answered to the patient's satisfaction. The patient was encouraged to be involved in their own care, and all diagnoses, medications, and medication side-effects were discussed.  A copy of the patient's medication list was printed and given to the patient. A good faith effort was made to reconcile the patient's medications.  The patient is aware that they are to contact me with any additional questions or concerns, or go to the ED in an emergency.          Health Maintenance Due   Topic Date Due    Hepatitis C screening  Never done    Covid-19 Vaccine (1) Never done    HIV Screening  Never done    Adult Tdap-Td (1 - Tdap) Never done    Hepatitis B Vaccine (1 of 3 - 19+ 3-dose series) Never done    Shingles Vaccine (1 of 2) Never done    Pneumococcal Vaccine, Age 76-64 (2 of 2 - PCV) 10/31/2005    Diabetic Retinal Exam  12/12/2021       Health Maintenance   Topic Date Due    Hepatitis C screening  Never done    Covid-19 Vaccine (1) Never done  HIV Screening  Never done    Adult Tdap-Td (1 - Tdap) Never done    Hepatitis B Vaccine (1 of 3 - 19+ 3-dose series) Never done    Shingles Vaccine (1 of 2) Never done    Pneumococcal Vaccine, Age 56-64 (2 of 2 - PCV) 10/31/2005    Diabetic Retinal Exam  12/12/2021    Diabetic A1C  06/11/2023    Influenza Vaccine (Season Ended) 07/18/2023    Depression Screening  11/21/2023    Diabetic Kidney Health eGFR  11/21/2023    Diabetic Kidney Health Microalb/Cr Ratio  11/21/2023    Prostate Cancer Screening  12/07/2023    NonMedicare Preventative Exam  03/11/2024    Colonoscopy  10/16/2027    Meningococcal Vaccine  Aged Out       Future Appointments   Date Time Provider Department Center    04/14/2023 11:30 AM Royanne Foots, NP [id number] T2   05/21/2023 10:15 AM Julian Hy, DO [facility] [place]   11/04/2023 10:00 AM Weghorst, Georgian Co, PA [facility]       .      This note was partially generated using MModal Fluency Direct system, and there may be some incorrect words, spellings, and punctuation that were not noted in checking the note before saving.        Neldon Mc, D.O.      [facility]  [address]   [place], [place]  [address]  Dept. Phone  /  [phone]  Dept Fax / [phone]

## 2023-03-13 ENCOUNTER — Other Ambulatory Visit (INDEPENDENT_AMBULATORY_CARE_PROVIDER_SITE_OTHER): Payer: Self-pay | Admitting: Family Medicine

## 2023-03-13 DIAGNOSIS — E7800 Pure hypercholesterolemia, unspecified: Secondary | ICD-10-CM

## 2023-03-15 ENCOUNTER — Other Ambulatory Visit (INDEPENDENT_AMBULATORY_CARE_PROVIDER_SITE_OTHER): Payer: Self-pay | Admitting: Family Medicine

## 2023-03-15 DIAGNOSIS — E1100 Type 2 diabetes mellitus with hyperosmolarity without nonketotic hyperglycemic-hyperosmolar coma (NKHHC): Secondary | ICD-10-CM

## 2023-03-15 DIAGNOSIS — E119 Type 2 diabetes mellitus without complications: Secondary | ICD-10-CM

## 2023-03-15 MED ORDER — LANCETS 28 GAUGE
1.0000 | Freq: Three times a day (TID) | 11 refills | Status: DC
Start: 2023-03-15 — End: 2023-10-29

## 2023-03-17 ENCOUNTER — Other Ambulatory Visit (INDEPENDENT_AMBULATORY_CARE_PROVIDER_SITE_OTHER): Payer: Self-pay | Admitting: Family Medicine

## 2023-03-17 ENCOUNTER — Telehealth (INDEPENDENT_AMBULATORY_CARE_PROVIDER_SITE_OTHER): Payer: Self-pay | Admitting: Family Medicine

## 2023-03-17 DIAGNOSIS — E1100 Type 2 diabetes mellitus with hyperosmolarity without nonketotic hyperglycemic-hyperosmolar coma (NKHHC): Secondary | ICD-10-CM

## 2023-03-17 MED ORDER — SEMAGLUTIDE 0.25 MG OR 0.5 MG (2 MG/3 ML) SUBCUTANEOUS PEN INJECTOR
0.5000 mg | PEN_INJECTOR | SUBCUTANEOUS | 5 refills | Status: DC
Start: 2023-03-17 — End: 2023-03-17

## 2023-03-17 MED ORDER — MOUNJARO 2.5 MG/0.5 ML SUBCUTANEOUS PEN INJECTOR
2.5000 mg | PEN_INJECTOR | SUBCUTANEOUS | 3 refills | Status: DC
Start: 2023-03-17 — End: 2023-05-10

## 2023-03-17 NOTE — Telephone Encounter (Signed)
Pt  LM stating that he is currently taking Mounjaro 5 mg. He did his last injection on Sunday. He is not able to find the medication in stock anywhere. He wants to know what he needs to do. Manuela Schwartz, [place]

## 2023-03-17 NOTE — Telephone Encounter (Signed)
Pt stated he found Mounjaro 2.5 mg at [facility] in [facility]. He wants to know if he can take this instead of switching to Ozempic.Manuela Schwartz, MA

## 2023-03-17 NOTE — Telephone Encounter (Signed)
patient has not been on ozempic, but is willing to try it. His pharmacy does have it available. pt also would like to know if once the mounjaro comes back if he could go back on it. He also questioned what does he would be started on.

## 2023-03-17 NOTE — Telephone Encounter (Signed)
Lm letting pt know that Mounjaro 2.5 mg was sent to [facility] in [facility]. Rx for Ozempic 0.5 mg cancelled.Manuela Schwartz, [place]

## 2023-03-17 NOTE — Telephone Encounter (Signed)
Lm for pt to call office. Montie Gelardi [place], MA

## 2023-03-26 ENCOUNTER — Telehealth (INDEPENDENT_AMBULATORY_CARE_PROVIDER_SITE_OTHER): Payer: Self-pay | Admitting: Family Medicine

## 2023-03-26 ENCOUNTER — Other Ambulatory Visit (INDEPENDENT_AMBULATORY_CARE_PROVIDER_SITE_OTHER): Payer: Self-pay | Admitting: Family Medicine

## 2023-03-26 MED ORDER — TADALAFIL 5 MG TABLET
5.0000 mg | ORAL_TABLET | Freq: Every day | ORAL | 3 refills | Status: DC
Start: 2023-03-26 — End: 2023-10-29

## 2023-03-26 NOTE — Telephone Encounter (Signed)
yes, patient takes one tablet daily.

## 2023-03-26 NOTE — Telephone Encounter (Signed)
pt notified

## 2023-03-26 NOTE — Telephone Encounter (Signed)
patient called in wanting to see if when he comes in July if you could do a prostate exam and call in his Cialis. He had an appt to establish with a urologist in [place] this month but had to cancel it and they are unable to get him in till January.

## 2023-03-26 NOTE — Telephone Encounter (Signed)
Is he taking this daily?

## 2023-04-01 DIAGNOSIS — M459 Ankylosing spondylitis of unspecified sites in spine: Secondary | ICD-10-CM | POA: Diagnosis not present

## 2023-04-14 ENCOUNTER — Ambulatory Visit (INDEPENDENT_AMBULATORY_CARE_PROVIDER_SITE_OTHER): Payer: Self-pay | Admitting: Family

## 2023-04-22 DIAGNOSIS — N401 Enlarged prostate with lower urinary tract symptoms: Secondary | ICD-10-CM | POA: Diagnosis not present

## 2023-04-22 DIAGNOSIS — Z5181 Encounter for therapeutic drug level monitoring: Secondary | ICD-10-CM | POA: Diagnosis not present

## 2023-04-22 DIAGNOSIS — I10 Essential (primary) hypertension: Secondary | ICD-10-CM | POA: Diagnosis not present

## 2023-04-22 DIAGNOSIS — E1169 Type 2 diabetes mellitus with other specified complication: Secondary | ICD-10-CM | POA: Diagnosis not present

## 2023-04-22 DIAGNOSIS — E7800 Pure hypercholesterolemia, unspecified: Secondary | ICD-10-CM | POA: Diagnosis not present

## 2023-05-04 DIAGNOSIS — M459 Ankylosing spondylitis of unspecified sites in spine: Secondary | ICD-10-CM | POA: Diagnosis not present

## 2023-05-08 ENCOUNTER — Other Ambulatory Visit (INDEPENDENT_AMBULATORY_CARE_PROVIDER_SITE_OTHER): Payer: Self-pay | Admitting: Family Medicine

## 2023-05-10 ENCOUNTER — Other Ambulatory Visit (INDEPENDENT_AMBULATORY_CARE_PROVIDER_SITE_OTHER): Payer: Self-pay | Admitting: Family Medicine

## 2023-05-10 ENCOUNTER — Encounter (INDEPENDENT_AMBULATORY_CARE_PROVIDER_SITE_OTHER): Payer: Self-pay | Admitting: Family Medicine

## 2023-05-10 MED ORDER — MOUNJARO 5 MG/0.5 ML SUBCUTANEOUS PEN INJECTOR
5.0000 mg | PEN_INJECTOR | SUBCUTANEOUS | 2 refills | Status: DC
Start: 2023-05-10 — End: 2023-06-18

## 2023-05-21 ENCOUNTER — Encounter (INDEPENDENT_AMBULATORY_CARE_PROVIDER_SITE_OTHER): Payer: Self-pay | Admitting: Family Medicine

## 2023-06-03 ENCOUNTER — Encounter (INDEPENDENT_AMBULATORY_CARE_PROVIDER_SITE_OTHER): Payer: BC Managed Care – PPO | Admitting: Family Medicine

## 2023-06-03 DIAGNOSIS — M459 Ankylosing spondylitis of unspecified sites in spine: Secondary | ICD-10-CM | POA: Diagnosis not present

## 2023-06-04 ENCOUNTER — Other Ambulatory Visit (HOSPITAL_BASED_OUTPATIENT_CLINIC_OR_DEPARTMENT_OTHER): Payer: Self-pay | Admitting: Surgical

## 2023-06-04 ENCOUNTER — Encounter (INDEPENDENT_AMBULATORY_CARE_PROVIDER_SITE_OTHER): Payer: BC Managed Care – PPO | Admitting: Family Medicine

## 2023-06-04 DIAGNOSIS — L4050 Arthropathic psoriasis, unspecified: Secondary | ICD-10-CM

## 2023-06-04 NOTE — Telephone Encounter (Signed)
AST (SGOT)    Date Value Ref Range Status   11/20/2022 28 8 - 45 U/L Final     ALT (SGPT)   Date Value Ref Range Status   11/20/2022 47 10 - 55 U/L Final     CREATININE   Date Value Ref Range Status   11/20/2022 1.19 0.75 - 1.35 mg/dL Final       COMPLETE BLOOD COUNT   Lab Results   Component Value Date    WBC 5.5 12/06/2021    HGB 15.2 12/06/2021    HCT 44.9 12/06/2021    PLTCNT 236 12/06/2021       DIFFERENTIAL  Lab Results   Component Value Date    PMNS 48 12/06/2021    LYMPHOCYTES 37 12/06/2021    MONOCYTES 10 12/06/2021    EOSINOPHIL 4 12/06/2021    BASOPHILS 1 12/06/2021    NRBCS 0 09/09/2011    PMNABS 3.82 10/07/2015    LYMPHSABS 2.09 10/07/2015    EOSABS 0.13 10/07/2015    MONOSABS 0.56 10/07/2015    BASOSABS 0.056 09/28/2014          Last visit: 11/02/2022  Next visit: 11/04/2023    Pilar Grammes, RN  06/04/2023, 08:17

## 2023-06-15 DIAGNOSIS — Z79899 Other long term (current) drug therapy: Secondary | ICD-10-CM | POA: Diagnosis not present

## 2023-06-15 DIAGNOSIS — M461 Sacroiliitis, not elsewhere classified: Secondary | ICD-10-CM | POA: Diagnosis not present

## 2023-06-15 DIAGNOSIS — M459 Ankylosing spondylitis of unspecified sites in spine: Secondary | ICD-10-CM | POA: Diagnosis not present

## 2023-06-15 DIAGNOSIS — E785 Hyperlipidemia, unspecified: Secondary | ICD-10-CM | POA: Diagnosis not present

## 2023-06-15 DIAGNOSIS — E1169 Type 2 diabetes mellitus with other specified complication: Secondary | ICD-10-CM | POA: Diagnosis not present

## 2023-06-18 ENCOUNTER — Other Ambulatory Visit: Payer: Self-pay

## 2023-06-18 ENCOUNTER — Encounter (INDEPENDENT_AMBULATORY_CARE_PROVIDER_SITE_OTHER): Payer: Self-pay | Admitting: Family Medicine

## 2023-06-18 ENCOUNTER — Ambulatory Visit (INDEPENDENT_AMBULATORY_CARE_PROVIDER_SITE_OTHER): Payer: BC Managed Care – PPO | Admitting: Family Medicine

## 2023-06-18 VITALS — BP 120/60 | HR 78 | Temp 98.5°F | Resp 16 | Ht 69.0 in | Wt 183.6 lb

## 2023-06-18 DIAGNOSIS — E7800 Pure hypercholesterolemia, unspecified: Secondary | ICD-10-CM

## 2023-06-18 DIAGNOSIS — L4050 Arthropathic psoriasis, unspecified: Secondary | ICD-10-CM

## 2023-06-18 DIAGNOSIS — Z7985 Long-term (current) use of injectable non-insulin antidiabetic drugs: Secondary | ICD-10-CM

## 2023-06-18 DIAGNOSIS — Z125 Encounter for screening for malignant neoplasm of prostate: Secondary | ICD-10-CM

## 2023-06-18 DIAGNOSIS — K219 Gastro-esophageal reflux disease without esophagitis: Secondary | ICD-10-CM

## 2023-06-18 DIAGNOSIS — Z1211 Encounter for screening for malignant neoplasm of colon: Secondary | ICD-10-CM

## 2023-06-18 DIAGNOSIS — I10 Essential (primary) hypertension: Secondary | ICD-10-CM

## 2023-06-18 DIAGNOSIS — I2510 Atherosclerotic heart disease of native coronary artery without angina pectoris: Secondary | ICD-10-CM

## 2023-06-18 DIAGNOSIS — E1100 Type 2 diabetes mellitus with hyperosmolarity without nonketotic hyperglycemic-hyperosmolar coma (NKHHC): Secondary | ICD-10-CM

## 2023-06-18 MED ORDER — MOUNJARO 7.5 MG/0.5 ML SUBCUTANEOUS PEN INJECTOR
7.5000 mg | PEN_INJECTOR | SUBCUTANEOUS | 1 refills | Status: DC
Start: 2023-06-18 — End: 2023-10-29

## 2023-06-18 NOTE — Progress Notes (Signed)
[facility]  [address]   [place], [place]  [address]  Dept. Phone : [phone]  Dept. Fax: [phone]          Chase [name]  Oct 20, 1966  [id number]    Date of Service: 06/18/2023  9:00 AM EDT    Chief complaint:   Chief Complaint   Patient presents with    Follow Up 6 Months     not fasting     Diabetes    Essential Hypertension       The patient is here for follow up of their chronic conditions.  He has been doing well.  He is on Mounjaro now.  He said he noticed it 1st when he started the medication that his blood sugars were lower than they are now.  He said that he is tolerating it very well.  He is just on the 5 mg dose.  He has lost about 30 lb thus far starting the [facility].  He said that he feeling great.  He is going to start exercising again.    Subjective:     This is a case of a 57 y.o. year old male who comes in today for a routine follow up .         The patient's chronic conditions are stable and unchanged . See Problem List for chronic conditions, which have been reviewed today.       Current Outpatient Medications   Medication Sig    Blood Sugar Diagnostic (ONETOUCH VERIO TEST STRIPS) Strip 1 Strip by In Vitro route Three times a day    Blood-Glucose Meter (ONETOUCH VERIO IQ METER) Misc 1 Each by In Vitro route Three times a day    enalapril maleate (VASOTEC) 10 mg Oral Tablet Take 1 Tablet (10 mg total) by mouth Once a day    HUMIRA,CF, PEN 40 mg/0.4 mL Subcutaneous Pen Injector Kit INJECT 40 MG UNDER THE SKIN EVERY 14 DAYS    lancets (ONETOUCH SURESOFT LANCING DEV) 28 gauge Misc 1 Each by In Vitro route Three times a day    metoprolol succinate (TOPROL-XL) 50 mg Oral Tablet Sustained Release 24 hr Take 1 Tablet (50 mg total) by mouth Once a day    rosuvastatin (CRESTOR) 20 mg Oral Tablet Take 1 Tablet (20 mg total) by mouth Every evening    tadalafil (CIALIS) 5 mg Oral Tablet Take 1 Tablet (5 mg total) by mouth Once a day    tirzepatide (MOUNJARO) 7.5 mg/0.5 mL  Subcutaneous Pen Injector Inject 0.5 mL (7.5 mg total) under the skin Every 7 days     Patient Active Problem List   Diagnosis    Essential hypertension    Gastroesophageal reflux disease without esophagitis    Urinary calculus, unspecified    Psoriatic arthritis (CMS HCC)    Fracture of distal end of tibia    Fracture of distal fibula    Coronary artery disease involving native coronary artery without angina pectoris    Diabetes mellitus, type 2 (CMS HCC)    Back pain    Pure hypercholesterolemia    Worsening headaches    Screening PSA (prostate specific antigen)    Screen for colon cancer    History of kidney stones    Right shoulder pain    Tubular adenoma of colon     Past Surgical History:   Procedure Laterality Date    HX APPENDECTOMY      2017    HX CARPAL  TUNNEL RELEASE  1990    bil    HX HEART CATHETERIZATION  11/09    HX HERNIA REPAIR  1970    2010    LITHOTRIPSY  2004    PB LAP UMBILICAL HERNIA REPAIR  2009         Family Medical History:       Problem Relation (Age of Onset)    Cancer Father    Diabetes Mother, Father    Hypertension (High Blood Pressure) Mother            Social History     Socioeconomic History    Marital status: Married    Number of children: 3   Tobacco Use    Smoking status: Former     Types: Cigars    Smokeless tobacco: Former     Types: Snuff     Quit date: 10/26/2019    Tobacco comments:     3-4 a week   Vaping Use    Vaping status: Never Used   Substance and Sexual Activity    Alcohol use: Yes     Comment: occassionally    Drug use: No     Social Determinants of Health     Social Connections: Low Risk  (10/15/2022)    Social Connections     SDOH Social Isolation: 5 or more times a week     Past Surgical History:   Procedure Laterality Date    COLONOSCOPY N/A 10/23/2016    Performed by Donney Rankins, MD at [facility] OR ENDO    COLONOSCOPY DIAGNOSTIC N/A 10/15/2022    Performed by Bunnie Philips, MD at [facility] OR ENDO    HX APPENDECTOMY      2017    HX CARPAL TUNNEL RELEASE  1990    bil     HX HEART CATHETERIZATION  11/09    HX HERNIA REPAIR  1970    2010    LITHOTRIPSY  2004    PB LAP UMBILICAL HERNIA REPAIR  2009         REVIEW OF SYSTEMS:  General: (-) fevers (-) chills. (+) weight loss. (-) fatigue.  Lymphatic: (-) palpable masses. (-) night sweats.  Heme: (-) easy bruising (-) bleeding. (-) recurrent infections.   HEENT. (-) vision changes (-) hearing changes. (-) dysphagia. (-) sore throat.   Heart: (-) chest pain. (-) palpitation. (-) orthopnea. (-) LE edema.   Lungs: (-) dyspnea (on exertion) (-) hemoptysis. (-) cough.   Abdomen: (-) poor appetite. (-) abdominal pain. (-) nausea (-) vomiting. (-) diarrhea. (-) constipation.   GU: (-) dysuria (-) Urgency. (-) Hematuria.   MS. (-) joint pain (-) ext swelling. (-) Back pain.   Dermatologic: (-) rashes. (-) pruritus.   Psychiatric: (-) Depression. (-) anxiety. (-) insomnia.   Neurologic: (-) headaches. (-) neuropathy. (-) weakness. (-) memory problems.              Objective:     BP 120/60   Pulse 78   Temp 36.9 C (98.5 F)   Resp 16   Ht 1.753 m (5\' 9" )   Wt 83.3 kg (183 lb 10.3 oz)   SpO2 96%   BMI 27.12 kg/m         General appearance: alert, oriented x 3, in his normal state, cooperative, not in apparent distress, appearing stated age   Integumentary:   Overall examination of the patient's skin reveals- no rashes, no suspicious lesions, and no bruises. Normal coloration of skin.  Normal skin moisture.  Head and Neck:  Normocephalic and atraumatic. No lesions or palpable masses. Neck is supple with full ROM. No bruit auscultated on the left or right. No nucchal rigidity. No lymphadenopathy. Thyroid:  Normal size and consistency, no palpable nodules, normal position and symmetric. Non tender.   Lungs: Respirations are non labored. Patient has no conversational dyspnea. Lungs are clear to ausculation .    Heart: regular rate and rhythm, S1, S2 normal, no murmur  Abdomen: soft, non-tender. Bowel sounds normal. No abnormal pulsations. No  rigidity and no hepatosplenomegaly . Normal active bowel sounds in all quadrants.   Extremities: extremities normal, atraumatic, no cyanosis or edema, pulses intact in upper and lower extremities.  No varicose ulcers.      Psych :  Patient is alert and oriented and cooperative with the exam. No evidence of hallucinations, delusions or homicidal/suicidal ideations.   Neuro:  Cranial Nerves 2-12 are grossly intact . Coordination is normal . Gait is normal.  No meningeal signs.     Assessment :       PHQ Questionnaire           ICD-10-CM    1. Type 2 diabetes mellitus with hyperosmolarity without coma, without long-term current use of insulin (CMS HCC)  E11.00     a1c 6.2% 03-12-23      2. Coronary artery disease involving native coronary artery without angina pectoris  I25.10       3. Essential hypertension  I10       4. Pure hypercholesterolemia  E78.00       5. Gastroesophageal reflux disease without esophagitis  K21.9       6. Psoriatic arthritis (CMS HCC)  L40.50       7. Screen for colon cancer  Z12.11     10-15-22      8. Screening PSA (prostate specific antigen)  Z12.5     12-06-21 result 0.29            PLAN :  He is not quite due for hemoglobin A1c.  His last A1c was 6.2 in April.  He is doing great on Mounjaro.  He has lost 30 lb the far.  He said that he noticed the sugars were initially doing better on the 5 mg dose.  Will go ahead and bump him up to the 7.5 mg a week dose.      Continue current treatment regimen. Patient is doing well. Patient is to call with any problems prior to next appointment if needed.    He is not fasting for labs.  We can check those when he returns in 3 months.  They been stable.      His blood pressure is well controlled.  Will continue current medications.      He has got a history of heart disease.  He is stable and asymptomatic at this time.    He follows with rheumatology for psoriatic arthritis in his stable.      He is up-to-date on screening colonoscopies.      He has not  had a recent PSA test done.  He does have an appointment coming up with Urology.  He said he is on a cancellation list.  He would like to defer the PSA until he sees urology.      Will plan to see him back in about 3 months for his regular checkup so we can do labs and his A1c at that time.  Return in about 3 months (around 09/18/2023).                 PHQ Questionnaire         The patient was given ample opportunity to ask questions and those questions were answered to the patient's satisfaction. The patient was encouraged to be involved in their own care, and all diagnoses, medications, and medication side-effects were discussed.  A copy of the patient's medication list was printed and given to the patient. A good faith effort was made to reconcile the patient's medications.  The patient is aware that they are to contact me with any additional questions or concerns, or go to the ED in an emergency.     Future Appointments   Date Time Provider Department Center   09/10/2023  9:00 AM Julian Hy, DO [facility]   11/04/2023 10:00 AM Weghorst, [place], [place] [facility]   12/16/2023  2:20 PM Semins, Lisa Roca, MD [id number] T2       Health Maintenance Due   Topic Date Due    Hepatitis C screening  Never done    Covid-19 Vaccine (1) Never done    HIV Screening  Never done    Adult Tdap-Td (1 - Tdap) Never done    Hepatitis B Vaccine (1 of 3 - 19+ 3-dose series) Never done    Shingles Vaccine (1 of 2) Never done    Pneumococcal Vaccine, Age 25-64 (2 of 2 - PCV) 10/31/2005    Diabetic Retinal Exam  12/12/2021     Health Maintenance   Topic Date Due    Hepatitis C screening  Never done    Covid-19 Vaccine (1) Never done    HIV Screening  Never done    Adult Tdap-Td (1 - Tdap) Never done    Hepatitis B Vaccine (1 of 3 - 19+ 3-dose series) Never done    Shingles Vaccine (1 of 2) Never done    Pneumococcal Vaccine, Age 25-64 (2 of 2 - PCV) 10/31/2005    Diabetic Retinal Exam  12/12/2021    Influenza  Vaccine (1) 07/18/2023    Diabetic A1C  09/11/2023    Depression Screening  11/21/2023    Diabetic Kidney Health eGFR  11/21/2023    Diabetic Kidney Health Microalb/Cr Ratio  11/21/2023    Prostate Cancer Screening  12/07/2023    NonMedicare Preventative Exam  03/11/2024    Colonoscopy  10/16/2027    Meningococcal Vaccine  Aged Out                 This note was partially generated using MModal Fluency Direct system, and there may be some incorrect words, spellings, and punctuation that were not noted in checking the note before saving.    Neldon Mc, DO    [facility]  [address]   [place], [place]  [address]  Dept. Phone  /  [phone]  Dept Fax / [phone]

## 2023-07-06 DIAGNOSIS — M459 Ankylosing spondylitis of unspecified sites in spine: Secondary | ICD-10-CM | POA: Diagnosis not present

## 2023-08-05 DIAGNOSIS — M459 Ankylosing spondylitis of unspecified sites in spine: Secondary | ICD-10-CM | POA: Diagnosis not present

## 2023-09-07 DIAGNOSIS — M459 Ankylosing spondylitis of unspecified sites in spine: Secondary | ICD-10-CM | POA: Diagnosis not present

## 2023-09-10 ENCOUNTER — Encounter (INDEPENDENT_AMBULATORY_CARE_PROVIDER_SITE_OTHER): Payer: Self-pay | Admitting: Family Medicine

## 2023-09-24 ENCOUNTER — Encounter (INDEPENDENT_AMBULATORY_CARE_PROVIDER_SITE_OTHER): Payer: Self-pay | Admitting: Family Medicine

## 2023-09-24 DIAGNOSIS — Z1211 Encounter for screening for malignant neoplasm of colon: Secondary | ICD-10-CM

## 2023-09-24 DIAGNOSIS — I10 Essential (primary) hypertension: Secondary | ICD-10-CM

## 2023-09-24 DIAGNOSIS — Z125 Encounter for screening for malignant neoplasm of prostate: Secondary | ICD-10-CM

## 2023-09-24 DIAGNOSIS — K219 Gastro-esophageal reflux disease without esophagitis: Secondary | ICD-10-CM

## 2023-09-24 DIAGNOSIS — L4050 Arthropathic psoriasis, unspecified: Secondary | ICD-10-CM

## 2023-09-24 DIAGNOSIS — E7800 Pure hypercholesterolemia, unspecified: Secondary | ICD-10-CM

## 2023-09-24 DIAGNOSIS — E1100 Type 2 diabetes mellitus with hyperosmolarity without nonketotic hyperglycemic-hyperosmolar coma (NKHHC): Secondary | ICD-10-CM

## 2023-10-07 DIAGNOSIS — M461 Sacroiliitis, not elsewhere classified: Secondary | ICD-10-CM | POA: Diagnosis not present

## 2023-10-07 DIAGNOSIS — E785 Hyperlipidemia, unspecified: Secondary | ICD-10-CM | POA: Diagnosis not present

## 2023-10-07 DIAGNOSIS — M459 Ankylosing spondylitis of unspecified sites in spine: Secondary | ICD-10-CM | POA: Diagnosis not present

## 2023-10-07 DIAGNOSIS — M549 Dorsalgia, unspecified: Secondary | ICD-10-CM | POA: Diagnosis not present

## 2023-10-07 DIAGNOSIS — E1169 Type 2 diabetes mellitus with other specified complication: Secondary | ICD-10-CM | POA: Diagnosis not present

## 2023-10-11 ENCOUNTER — Other Ambulatory Visit: Payer: Self-pay

## 2023-10-11 ENCOUNTER — Encounter (INDEPENDENT_AMBULATORY_CARE_PROVIDER_SITE_OTHER): Payer: Self-pay | Admitting: Family Medicine

## 2023-10-11 ENCOUNTER — Other Ambulatory Visit (INDEPENDENT_AMBULATORY_CARE_PROVIDER_SITE_OTHER): Payer: BC Managed Care – PPO

## 2023-10-11 ENCOUNTER — Ambulatory Visit (INDEPENDENT_AMBULATORY_CARE_PROVIDER_SITE_OTHER): Payer: BC Managed Care – PPO | Admitting: Family Medicine

## 2023-10-11 ENCOUNTER — Other Ambulatory Visit: Payer: BC Managed Care – PPO | Attending: Family Medicine | Admitting: Family Medicine

## 2023-10-11 VITALS — BP 120/76 | HR 78 | Temp 98.9°F | Resp 16 | Ht 69.0 in | Wt 177.5 lb

## 2023-10-11 DIAGNOSIS — E1100 Type 2 diabetes mellitus with hyperosmolarity without nonketotic hyperglycemic-hyperosmolar coma (NKHHC): Secondary | ICD-10-CM

## 2023-10-11 DIAGNOSIS — I2510 Atherosclerotic heart disease of native coronary artery without angina pectoris: Secondary | ICD-10-CM

## 2023-10-11 DIAGNOSIS — I10 Essential (primary) hypertension: Secondary | ICD-10-CM | POA: Insufficient documentation

## 2023-10-11 DIAGNOSIS — Z125 Encounter for screening for malignant neoplasm of prostate: Secondary | ICD-10-CM

## 2023-10-11 DIAGNOSIS — Z7985 Long-term (current) use of injectable non-insulin antidiabetic drugs: Secondary | ICD-10-CM

## 2023-10-11 DIAGNOSIS — E7800 Pure hypercholesterolemia, unspecified: Secondary | ICD-10-CM

## 2023-10-11 DIAGNOSIS — K219 Gastro-esophageal reflux disease without esophagitis: Secondary | ICD-10-CM

## 2023-10-11 DIAGNOSIS — Z1211 Encounter for screening for malignant neoplasm of colon: Secondary | ICD-10-CM

## 2023-10-11 DIAGNOSIS — L4050 Arthropathic psoriasis, unspecified: Secondary | ICD-10-CM

## 2023-10-11 LAB — COMPREHENSIVE METABOLIC PNL, FASTING
ALBUMIN: 4.4 g/dL (ref 3.5–5.0)
ALKALINE PHOSPHATASE: 47 U/L (ref 45–115)
ALT (SGPT): 39 U/L (ref 10–55)
ANION GAP: 8 mmol/L (ref 4–13)
AST (SGOT): 24 U/L (ref 8–45)
BILIRUBIN TOTAL: 1.1 mg/dL (ref 0.3–1.3)
BUN/CREA RATIO: 14 (ref 6–22)
BUN: 15 mg/dL (ref 8–25)
CALCIUM: 9.9 mg/dL (ref 8.6–10.2)
CHLORIDE: 104 mmol/L (ref 96–111)
CO2 TOTAL: 27 mmol/L (ref 22–30)
CREATININE: 1.04 mg/dL (ref 0.75–1.35)
ESTIMATED GFR - MALE: 83 mL/min/BSA (ref >=60)
GLUCOSE: 120 mg/dL — ABNORMAL HIGH (ref 70–99)
POTASSIUM: 4.9 mmol/L (ref 3.5–5.1)
PROTEIN TOTAL: 8 g/dL (ref 6.4–8.3)
SODIUM: 139 mmol/L (ref 136–145)

## 2023-10-11 LAB — LIPID PANEL
CHOL/HDL RATIO: 2.1
CHOLESTEROL: 105 mg/dL (ref 100–200)
HDL CHOL: 50 mg/dL (ref >=50)
LDL CALC: 40 mg/dL (ref <100)
NON-HDL: 55 mg/dL (ref <=190)
TRIGLYCERIDES: 72 mg/dL (ref <150)
VLDL CALC: 10 mg/dL (ref <30)

## 2023-10-11 LAB — POCT HGB A1C: POCT HGB A1C: 6.4 % — AB (ref 4–6)

## 2023-10-11 LAB — MICROALBUMIN/CREATININE RATIO, URINE, RANDOM
CREATININE RANDOM URINE: 97 mg/dL
MICROALBUMIN RANDOM URINE: 0.5 mg/dL
MICROALBUMIN/CREATININE RATIO RANDOM URINE: 5.2 mg/g (ref <30.0)

## 2023-10-11 LAB — PSA SCREENING: PSA: 0.28 ng/mL (ref <=4.00)

## 2023-10-11 NOTE — Progress Notes (Signed)
[facility]  [address]   [place], [place]  [address]  Dept. Phone : [phone]  Dept. Fax: [phone]          Chase [name]  [birth year]  [id number]    Date of Service: 10/11/2023 10:00 AM EST    Chief complaint:   Chief Complaint   Patient presents with    Follow Up 3 Months     fasting     Diabetes    Hypercholesterolemia, Pure       The patient is here for follow up of their chronic conditions. He has been doing well. He is following with Rheum for his psoriatic arthritis .  He has not had any med changes recently.  He has been well maintained on Humira for quite awhile.    Subjective:     This is a case of a 57 y.o. year old male who comes in today for a routine follow up .         The patient's chronic conditions are stable and unchanged . See Problem List for chronic conditions, which have been reviewed today.       Current Outpatient Medications   Medication Sig    Blood Sugar Diagnostic (ONETOUCH VERIO TEST STRIPS) Strip 1 Strip by In Vitro route Three times a day    Blood-Glucose Meter (ONETOUCH VERIO IQ METER) Misc 1 Each by In Vitro route Three times a day    enalapril maleate (VASOTEC) 10 mg Oral Tablet Take 1 Tablet (10 mg total) by mouth Once a day    HUMIRA,CF, PEN 40 mg/0.4 mL Subcutaneous Pen Injector Kit INJECT 40 MG UNDER THE SKIN EVERY 14 DAYS    lancets (ONETOUCH SURESOFT LANCING DEV) 28 gauge Misc 1 Each by In Vitro route Three times a day    metoprolol succinate (TOPROL-XL) 50 mg Oral Tablet Sustained Release 24 hr Take 1 Tablet (50 mg total) by mouth Once a day    rosuvastatin (CRESTOR) 20 mg Oral Tablet Take 1 Tablet (20 mg total) by mouth Every evening    tadalafil (CIALIS) 5 mg Oral Tablet Take 1 Tablet (5 mg total) by mouth Once a day    tirzepatide (MOUNJARO) 7.5 mg/0.5 mL Subcutaneous Pen Injector Inject 0.5 mL (7.5 mg total) under the skin Every 7 days     Patient Active Problem List   Diagnosis    Essential hypertension    Gastroesophageal reflux disease  without esophagitis    Urinary calculus, unspecified    Psoriatic arthritis (CMS HCC)    Fracture of distal end of tibia    Fracture of distal fibula    Coronary artery disease involving native coronary artery without angina pectoris    Diabetes mellitus, type 2 (CMS HCC)    Back pain    Pure hypercholesterolemia    Worsening headaches    Screening PSA (prostate specific antigen)    Screen for colon cancer    History of kidney stones    Right shoulder pain    Tubular adenoma of colon     Past Surgical History:   Procedure Laterality Date    HX APPENDECTOMY      2017    HX CARPAL TUNNEL RELEASE  1990    bil    HX HEART CATHETERIZATION  11/09    HX HERNIA REPAIR  1970    2010    LITHOTRIPSY  2004    PB LAP UMBILICAL HERNIA REPAIR  2009  Family Medical History:       Problem Relation (Age of Onset)    Cancer Father    Diabetes Mother, Father    Hypertension (High Blood Pressure) Mother            Social History     Socioeconomic History    Marital status: Married    Number of children: 3   Tobacco Use    Smoking status: Former     Types: Cigars    Smokeless tobacco: Former     Types: Snuff     Quit date: 10/26/2019    Tobacco comments:     3-4 a week   Vaping Use    Vaping status: Never Used   Substance and Sexual Activity    Alcohol use: Yes     Comment: occassionally    Drug use: No     Social Determinants of Health     Social Connections: Low Risk  (10/15/2022)    Social Connections     SDOH Social Isolation: 5 or more times a week     Past Surgical History:   Procedure Laterality Date    COLONOSCOPY N/A 10/23/2016    Performed by Donney Rankins, MD at [facility] [place] [facility] OR ENDO    COLONOSCOPY DIAGNOSTIC N/A 10/15/2022    Performed by Bunnie Philips, MD at [facility] OR ENDO    HX APPENDECTOMY      2017    HX CARPAL TUNNEL RELEASE  1990    bil    HX HEART CATHETERIZATION  11/09    HX HERNIA REPAIR  1970    2010    LITHOTRIPSY  2004    PB LAP UMBILICAL HERNIA REPAIR  2009         REVIEW OF SYSTEMS:  General: (-) fevers (-) chills.  (-) weight loss. (-) fatigue.  Lymphatic: (-) palpable masses. (-) night sweats.  Heme: (-) easy bruising (-) bleeding. (-) recurrent infections.   HEENT. (-) vision changes (-) hearing changes. (-) dysphagia. (-) sore throat.   Heart: (-) chest pain. (-) palpitation. (-) orthopnea. (-) LE edema.   Lungs: (-) dyspnea (on exertion) (-) hemoptysis. (-) cough.   Abdomen: (-) poor appetite. (-) abdominal pain. (-) nausea (-) vomiting. (-) diarrhea. (-) constipation.   GU: (-) dysuria (-) Urgency. (-) Hematuria.   MS. (+) joint pain (-) ext swelling. (-) Back pain.   Dermatologic: (-) rashes. (-) pruritus.   Psychiatric: (-) Depression. (-) anxiety. (-) insomnia.   Neurologic: (-) headaches. (-) neuropathy. (-) weakness. (-) memory problems.              Objective:     BP 120/76   Pulse 78   Temp 37.2 C (98.9 F)   Resp 16   Ht 1.753 m (5\' 9" )   Wt 80.5 kg (177 lb 7.5 oz)   SpO2 98%   BMI 26.21 kg/m         General appearance: alert, oriented x 3, in his normal state, cooperative, not in apparent distress, appearing stated age   Integumentary:   Overall examination of the patient's skin reveals- no rashes, no suspicious lesions, and no bruises. Normal coloration of skin. Normal skin moisture.  Head and Neck:  Normocephalic and atraumatic. No lesions or palpable masses. Neck is supple with full ROM. No bruit auscultated on the left or right. No nucchal rigidity. No lymphadenopathy. Thyroid:  Normal size and consistency, no palpable nodules, normal position and symmetric. Non tender.  Lungs: Respirations are non labored. Patient has no conversational dyspnea. Lungs are clear to ausculation .    Heart: regular rate and rhythm, S1, S2 normal, no murmur  Abdomen: soft, non-tender. Bowel sounds normal. No abnormal pulsations. No rigidity and no hepatosplenomegaly . Normal active bowel sounds in all quadrants.   Extremities: extremities normal, atraumatic, no cyanosis or edema, pulses intact in upper and lower  extremities.  No varicose ulcers.      Psych :  Patient is alert and oriented and cooperative with the exam. No evidence of hallucinations, delusions or homicidal/suicidal ideations.   Neuro:  Cranial Nerves 2-12 are grossly intact . Coordination is normal . Gait is normal.  No meningeal signs.     Assessment :       PHQ Questionnaire           ICD-10-CM    1. Type 2 diabetes mellitus with hyperosmolarity without coma, without long-term current use of insulin (CMS HCC)  E11.00 POCT HGB A1C     COMPREHENSIVE METABOLIC PNL, FASTING     LIPID PANEL     MICROALBUMIN/CREATININE RATIO, URINE, RANDOM    a1c 6.2% 03-12-23      2. Psoriatic arthritis (CMS HCC)  L40.50       3. Pure hypercholesterolemia  E78.00 COMPREHENSIVE METABOLIC PNL, FASTING     LIPID PANEL      4. Essential hypertension  I10 COMPREHENSIVE METABOLIC PNL, FASTING     LIPID PANEL      5. Screening PSA (prostate specific antigen)  Z12.5 PSA SCREENING    12-06-21 result 0.29      6. Screen for colon cancer  Z12.11     10-15-22      7. Coronary artery disease involving native coronary artery without angina pectoris  I25.10       8. Gastroesophageal reflux disease without esophagitis  K21.9             PLAN : Diabetes Monitors  A1C: 6.4  A1C Date: 10/11/2023  Kidney Health:   Urine Microalbumin/Cr Ratio--7 Ur Microalb/Cr Ratio date--11/20/2022   eGFR --71 eGFR date--11/20/2022    Last Lipid Panel  (Last result in the past 2 years)        Cholesterol   HDL   LDL   Direct LDL   Triglycerides      11/20/22 1015 119   48   46  Comment: <100 mg/dL, Optimal  161-096 mg/dL, Near/Above Optimal  045-409 mg/dL, Borderline High  811-914 mg/dL, High  >=782 mg/dL, Very high     956            Retinal Exam Date: 12/12/2020 retinopathy status not documented  Last Foot Exam: Not Found    He is doing great in regards to his diabetes. WIll continue current meds.  He is on [facility] and is doing very well with the medication.  He has been losing weight and keeping it off.  He said  that he has changed his eating habits.  Will continue his current medication    Continue current treatment regimen. Patient is doing well. Patient is to call with any problems prior to next appointment if needed.    Will draw fasting labs today.     He follows with rheumatology for his psoriatic arthritis.  He has been doing great with the Humira.  He said he really has not had psoriasis but does have they arthritis pain.  That Humira does help with that.  He is up-to-date on screening colonoscopies.      He is overdue for a PSA test.  Will check on his labs.    Overall he is doing great.  I recommend he continue doing what he is doing and will plan to see him back in 6 months sooner if any  No follow-ups on file.                 PHQ Questionnaire         The patient was given ample opportunity to ask questions and those questions were answered to the patient's satisfaction. The patient was encouraged to be involved in their own care, and all diagnoses, medications, and medication side-effects were discussed.  A copy of the patient's medication list was printed and given to the patient. A good faith effort was made to reconcile the patient's medications.  The patient is aware that they are to contact me with any additional questions or concerns, or go to the ED in an emergency.     Future Appointments   Date Time Provider Department Center   10/11/2023 10:00 AM Julian Hy, DO [facility]   11/04/2023 10:00 AM Weghorst, [place], [place] [facility]   12/16/2023  2:20 PM Semins, Lisa Roca, MD [id number] T2       Health Maintenance Due   Topic Date Due    Hepatitis C screening  Never done    Covid-19 Vaccine (1) Never done    HIV Screening  Never done    Adult Tdap-Td (1 - Tdap) Never done    Hepatitis B Vaccine (1 of 3 - 19+ 3-dose series) Never done    Shingles Vaccine (1 of 2) Never done    Pneumococcal Vaccine, Age 41-64 (2 of 2 - PCV) 10/31/2005    Diabetic Retinal Exam  12/12/2021    Influenza  Vaccine (1) 07/18/2023     Health Maintenance   Topic Date Due    Hepatitis C screening  Never done    Covid-19 Vaccine (1) Never done    HIV Screening  Never done    Adult Tdap-Td (1 - Tdap) Never done    Hepatitis B Vaccine (1 of 3 - 19+ 3-dose series) Never done    Shingles Vaccine (1 of 2) Never done    Pneumococcal Vaccine, Age 41-64 (2 of 2 - PCV) 10/31/2005    Diabetic Retinal Exam  12/12/2021    Influenza Vaccine (1) 07/18/2023    Depression Screening  11/21/2023    Diabetic Kidney Health eGFR  11/21/2023    Diabetic Kidney Health Microalb/Cr Ratio  11/21/2023    Prostate Cancer Screening  12/07/2023    NonMedicare Preventative Exam  03/11/2024    Diabetic A1C  04/09/2024    Colonoscopy  10/16/2027                 This note was partially generated using MModal Fluency Direct system, and there may be some incorrect words, spellings, and punctuation that were not noted in checking the note before saving.    Neldon Mc, DO    [facility]  [address]   [place], [place]  [address]  Dept. Phone  /  [phone]  Dept Fax / [phone]

## 2023-10-11 NOTE — Progress Notes (Signed)
Venipuncture was performed in office and patient tolerated it well.  Dry dressing was applied to the site and secured with paper tape.  Patient also completed urine sample.  Specimens were labeled and prepped for transport to [facility].

## 2023-10-26 ENCOUNTER — Other Ambulatory Visit (INDEPENDENT_AMBULATORY_CARE_PROVIDER_SITE_OTHER): Payer: Self-pay | Admitting: Urology

## 2023-10-28 ENCOUNTER — Encounter (INDEPENDENT_AMBULATORY_CARE_PROVIDER_SITE_OTHER): Payer: Self-pay | Admitting: Family Medicine

## 2023-10-29 ENCOUNTER — Other Ambulatory Visit (INDEPENDENT_AMBULATORY_CARE_PROVIDER_SITE_OTHER): Payer: Self-pay | Admitting: Family Medicine

## 2023-10-29 DIAGNOSIS — E119 Type 2 diabetes mellitus without complications: Secondary | ICD-10-CM

## 2023-10-29 DIAGNOSIS — E7800 Pure hypercholesterolemia, unspecified: Secondary | ICD-10-CM

## 2023-10-29 DIAGNOSIS — E1100 Type 2 diabetes mellitus with hyperosmolarity without nonketotic hyperglycemic-hyperosmolar coma (NKHHC): Secondary | ICD-10-CM

## 2023-10-29 DIAGNOSIS — I10 Essential (primary) hypertension: Secondary | ICD-10-CM

## 2023-10-29 MED ORDER — MOUNJARO 7.5 MG/0.5 ML SUBCUTANEOUS PEN INJECTOR
7.5000 mg | PEN_INJECTOR | SUBCUTANEOUS | 3 refills | Status: DC
Start: 2023-10-29 — End: 2023-12-03

## 2023-10-29 MED ORDER — ROSUVASTATIN 20 MG TABLET
20.0000 mg | ORAL_TABLET | Freq: Every evening | ORAL | 3 refills | Status: DC
Start: 2023-10-29 — End: 2024-04-05

## 2023-10-29 MED ORDER — ENALAPRIL MALEATE 10 MG TABLET
10.0000 mg | ORAL_TABLET | Freq: Every day | ORAL | 3 refills | Status: DC
Start: 2023-10-29 — End: 2024-04-05

## 2023-10-29 MED ORDER — LANCETS 28 GAUGE
1.0000 | Freq: Three times a day (TID) | 3 refills | Status: AC
Start: 2023-10-29 — End: ?

## 2023-10-29 MED ORDER — ONETOUCH VERIO TEST STRIPS
1.0000 | ORAL_STRIP | Freq: Three times a day (TID) | 3 refills | Status: DC
Start: 2023-10-29 — End: 2024-04-05

## 2023-10-29 MED ORDER — METOPROLOL SUCCINATE ER 50 MG TABLET,EXTENDED RELEASE 24 HR
50.0000 mg | ORAL_TABLET | Freq: Every day | ORAL | 3 refills | Status: DC
Start: 2023-10-29 — End: 2024-01-31

## 2023-10-29 MED ORDER — TADALAFIL 5 MG TABLET
5.0000 mg | ORAL_TABLET | Freq: Every day | ORAL | 3 refills | Status: DC
Start: 2023-10-29 — End: 2023-12-16

## 2023-11-04 ENCOUNTER — Ambulatory Visit (HOSPITAL_BASED_OUTPATIENT_CLINIC_OR_DEPARTMENT_OTHER): Payer: Self-pay | Admitting: Surgical

## 2023-11-04 DIAGNOSIS — M459 Ankylosing spondylitis of unspecified sites in spine: Secondary | ICD-10-CM | POA: Diagnosis not present

## 2023-11-15 ENCOUNTER — Other Ambulatory Visit (HOSPITAL_BASED_OUTPATIENT_CLINIC_OR_DEPARTMENT_OTHER): Payer: Self-pay | Admitting: Surgical

## 2023-11-15 ENCOUNTER — Other Ambulatory Visit: Payer: Self-pay

## 2023-11-15 ENCOUNTER — Encounter (HOSPITAL_BASED_OUTPATIENT_CLINIC_OR_DEPARTMENT_OTHER): Payer: Self-pay | Admitting: Surgical

## 2023-11-15 ENCOUNTER — Ambulatory Visit: Payer: BC Managed Care – PPO | Attending: Surgical | Admitting: Surgical

## 2023-11-15 VITALS — HR 82 | Ht 69.0 in | Wt 178.6 lb

## 2023-11-15 DIAGNOSIS — L4059 Other psoriatic arthropathy: Secondary | ICD-10-CM

## 2023-11-15 DIAGNOSIS — Z79899 Other long term (current) drug therapy: Secondary | ICD-10-CM | POA: Insufficient documentation

## 2023-11-15 DIAGNOSIS — L4050 Arthropathic psoriasis, unspecified: Secondary | ICD-10-CM

## 2023-11-15 NOTE — Progress Notes (Signed)
[facility]  [facility]  [address]  [facility] [address]  [phone]      Subjective:  Chase [name] is a 57 y.o. year old male who presents to clinic for follow up of psoriatic arthritis. He was last seen in clinic on 11/02/2022.At that time, he continued Humira 40 mg subq Q 14 days.     In the interim, patient reports doing well overall. He continues Humira 40 mg subq Q 14 days. He reports intermittent pain of his ankles, knees and back that is dependent on activity. He denies significant AM stiffness, but does have worsening dependent on activity. Otherwise, he endorses to be overall stable without any significant flares in the interim. His skin has been doing well without recent rashes, or plaques. He denies regular use of NSAID's, or Tylenol. Denies recurrent infections, cough, or SOB.     He reports losing ~40 pounds since his last visit. He is currently taking Mounjaro for DM management, and has benefit.     He follows with his PCP next in May.       ROS:  All other systems reviewed in detail are negative, except as noted.    Relevant Medications: Enbrel    Medications:  Current Outpatient Medications   Medication Sig    Blood Sugar Diagnostic (ONETOUCH VERIO TEST STRIPS) Strip 1 Strip by In Vitro route Three times a day    Blood-Glucose Meter (ONETOUCH VERIO IQ METER) Misc 1 Each by In Vitro route Three times a day    enalapril maleate (VASOTEC) 10 mg Oral Tablet Take 1 Tablet (10 mg total) by mouth Once a day    HUMIRA,CF, PEN 40 mg/0.4 mL Subcutaneous Pen Injector Kit INJECT 40 MG UNDER THE SKIN EVERY 14 DAYS    lancets (ONETOUCH SURESOFT LANCING DEV) 28 gauge Misc 1 Each by In Vitro route Three times a day    metoprolol succinate (TOPROL-XL) 50 mg Oral Tablet Sustained Release 24 hr Take 1 Tablet (50 mg total) by mouth Once a day    rosuvastatin (CRESTOR) 20 mg Oral Tablet Take 1 Tablet (20 mg total) by mouth Every evening    tadalafil  (CIALIS) 5 mg Oral Tablet Take 1 Tablet (5 mg total) by mouth Once a day    tirzepatide (MOUNJARO) 7.5 mg/0.5 mL Subcutaneous Pen Injector Inject 0.5 mL (7.5 mg total) under the skin Every 7 days     Objective  Pulse 82   Ht 1.753 m (5\' 9" )   Wt 81 kg (178 lb 9.2 oz)   SpO2 99%   BMI 26.37 kg/m   General: No acute distress. Appears stated age.  Eyes: Conjunctiva clear. No scleral icterus  MSK: No synovitis or tenderness.   Skin: No facial rash, no psoriasis plaques.    Assessment/Plan  Assessment/Plan   1. Psoriatic arthritis (CMS HCC)    2. High risk medication use        Chase [name] is a 57 y.o. male presenting for follow up of psoriatic arthritis. Currently maintained on Humira Q 14 days which he has been taking since 2013. Stable disease activity with no active synovitis on exam.     Plan:  - Continue Humira 40 mg subq Q 14 days.   - Will plan to obtain CBC with his next set of labs. Continue CBC every 6 months.     RTC in 1 year for F/U; or sooner, if needed.    Orders  Placed This Encounter    Cbc/Diff     I am scribing for, and in the presence of [facility], for services provided on 11/15/2023.  // Caleb Popp, SCRIBE 11/15/2023, 10:36    I personally performed the services described in this documentation, as scribed  in my presence, and it is both accurate  and complete.      Georgian Co Artice Holohan PA-C  [facility] Medicine  Rheumatology

## 2023-11-15 NOTE — Nursing Note (Signed)
AST (SGOT)    Date Value Ref Range Status   10/11/2023 24 8 - 45 U/L Final     ALT (SGPT)   Date Value Ref Range Status   10/11/2023 39 10 - 55 U/L Final     CREATININE   Date Value Ref Range Status   10/11/2023 1.04 0.75 - 1.35 mg/dL Final       COMPLETE BLOOD COUNT   Lab Results   Component Value Date    WBC 5.5 12/06/2021    HGB 15.2 12/06/2021    HCT 44.9 12/06/2021    PLTCNT 236 12/06/2021       DIFFERENTIAL  Lab Results   Component Value Date    PMNS 48 12/06/2021    LYMPHOCYTES 37 12/06/2021    MONOCYTES 10 12/06/2021    EOSINOPHIL 4 12/06/2021    BASOPHILS 1 12/06/2021    NRBCS 0 09/09/2011    PMNABS 3.82 10/07/2015    LYMPHSABS 2.09 10/07/2015    EOSABS 0.13 10/07/2015    MONOSABS 0.56 10/07/2015    BASOSABS 0.056 09/28/2014          Last visit: 10/2022  Next visit: Today    Eston Mould, CMA  11/15/2023, 07:39

## 2023-11-21 ENCOUNTER — Other Ambulatory Visit: Payer: Self-pay

## 2023-11-26 ENCOUNTER — Encounter (HOSPITAL_COMMUNITY): Payer: Self-pay

## 2023-11-26 ENCOUNTER — Telehealth (HOSPITAL_COMMUNITY): Payer: Self-pay

## 2023-11-26 ENCOUNTER — Ambulatory Visit (HOSPITAL_COMMUNITY)
Admission: EM | Admit: 2023-11-26 | Discharge: 2023-11-26 | Disposition: A | Discharge status: Home / Self Care | Payer: BC Managed Care – PPO

## 2023-11-26 DIAGNOSIS — M62838 Other muscle spasm: Secondary | ICD-10-CM

## 2023-11-26 DIAGNOSIS — I10 Essential (primary) hypertension: Secondary | ICD-10-CM | POA: Insufficient documentation

## 2023-11-26 HISTORY — DX: Essential (primary) hypertension: I10

## 2023-11-26 HISTORY — DX: Type 2 diabetes mellitus without complications: E11.9

## 2023-11-26 MED ORDER — METHOCARBAMOL 500 MG PO TABS: 500.0000 mg | ORAL_TABLET | Freq: Two times a day (BID) | ORAL | 0 refills | Status: DC

## 2023-11-26 MED ORDER — DEXAMETHASONE SODIUM PHOSPHATE 10 MG/ML IJ SOLN
INTRAMUSCULAR | Status: AC
  Filled 2023-11-26: qty 1

## 2023-11-26 MED ORDER — DEXAMETHASONE SODIUM PHOSPHATE 10 MG/ML IJ SOLN
10.0000 mg | Freq: Once | INTRAMUSCULAR | Status: AC
  Administered 2023-11-26: 10 mg via INTRAMUSCULAR

## 2023-11-26 MED ORDER — METHOCARBAMOL 500 MG PO TABS: 500.0000 mg | ORAL_TABLET | Freq: Two times a day (BID) | ORAL | 0 refills | Status: AC

## 2023-11-26 NOTE — ED Provider Notes (Signed)
 [facility]    CSN: [id number] Arrival date & time: 11/26/23  9195      History   Chief Complaint Chief Complaint  Patient presents with   Neck Pain    HPI Kyle Hughes is a 58 y.o. male.   Patient presents to clinic for left-sided neck pain that has been gradually worsening since Sunday.  He fell asleep on the couch Sunday night in a weird position.  Afterwards he moved to the bed and felt a weird sensation in his left trapezius.  He went to work over the next few days and felt an occasional sharp pain with neck movement.  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  Yesterday at work he is unable to turn his neck due to pain and stiffness of the left side.  Had a hard time looking up and driving due to how he had to compensate for his stiff neck.  No recent trauma, injuries or falls.  He takes ibuprofen daily for other issues, this will help for a short period of time.  Patient with a history of type 2 diabetes, hypertension and Ankylosing spondylitis.  The history is provided by the patient and medical records.  Neck Pain   Past Medical History:  Diagnosis Date   Diabetes mellitus without complication (HCC)    Hypertension     Patient Active Problem List   Diagnosis Date Noted   Hypertensive disorder 11/26/2023    History reviewed. No pertinent surgical history.     Home Medications    Prior to Admission medications   Medication Sig Start Date End Date Taking? Authorizing Provider  AMLODIPINE BESYLATE PO Take 1 tablet by mouth daily.   Yes [provider]  ATORVASTATIN CALCIUM PO Take by mouth.   Yes [provider]  Empagliflozin (JARDIANCE PO) Take by mouth.   Yes [provider]  ibuprofen (ADVIL) 400 MG tablet Take 400 mg by mouth every 6 (six) hours as needed.   Yes [provider]  METFORMIN HCL PO Take 1 tablet by mouth daily.   Yes [provider]   methocarbamol  (ROBAXIN ) 500 MG tablet Take 1 tablet (500 mg total) by mouth 2 (two) times daily. 11/26/23  Yes Vee Bahe  N, FNP  METOPROLOL SUCCINATE PO Take by mouth.   Yes [provider]  triamterene-hydrochlorothiazide (MAXZIDE-25) 37.5-25 MG tablet Take 1 tablet by mouth every morning.   Yes [provider]  LOSARTAN POTASSIUM PO Take 1 tablet by mouth daily.    [provider]    Family History Family History  Family history unknown: Yes    Social History Social History   Tobacco Use   Smoking status: Former   Smokeless tobacco: Never  Substance Use Topics   Alcohol use: Not Currently   Drug use: Never     Allergies   Patient has no known allergies.   Review of Systems Review of Systems  Per HPI   Physical Exam Triage Vital Signs ED Triage Vitals [11/26/23 0851]  Encounter Vitals Group     BP (!) 144/79     Systolic BP Percentile      Diastolic BP Percentile      Pulse Rate 78     Resp 18     Temp 98 F (36.7 C)     Temp Source Oral     SpO2 96 %     Weight      Height  Head Circumference      Peak Flow      Pain Score      Pain Loc      Pain Education      Exclude from Growth Chart    No data found.  Updated Vital Signs BP (!) 144/79 (BP Location: Left Arm)   Pulse 78   Temp 98 F (36.7 C) (Oral)   Resp 18   SpO2 96%   Visual Acuity Right Eye Distance:   Left Eye Distance:   Bilateral Distance:    Right Eye Near:   Left Eye Near:    Bilateral Near:     Physical Exam Vitals and nursing note reviewed.  Constitutional:      Appearance: Normal appearance.  HENT:     Head: Normocephalic and atraumatic.     Right Ear: External ear normal.     Left Ear: External ear normal.     Nose: Nose normal.     Mouth/Throat:     Mouth: Mucous membranes are moist.  Eyes:     Conjunctiva/sclera: Conjunctivae normal.  Neck:      Comments: Left-sided trapezius and muscular pain with range of motion.   Mild pain to palpation.  No cervical spinal tenderness, step-off or deformity.  Atraumatic. Cardiovascular:     Rate and Rhythm: Normal rate.  Pulmonary:     Effort: Pulmonary effort is normal. No respiratory distress.  Musculoskeletal:        General: No swelling, deformity or signs of injury.     Cervical back: Tenderness present. Pain with movement and muscular tenderness present. No spinous process tenderness. Decreased range of motion.  Skin:    General: Skin is warm and dry.  Neurological:     General: No focal deficit present.     Mental Status: He is alert and oriented to person, place, and time.  Psychiatric:        Mood and Affect: Mood normal.        Behavior: Behavior normal. Behavior is cooperative.      UC Treatments / Results  Labs (all labs ordered are listed, but only abnormal results are displayed) Labs Reviewed - No data to display  EKG   Radiology No results found.  Procedures Procedures (including critical care time)  Medications Ordered in UC Medications  dexamethasone  (DECADRON ) injection 10 mg (has no administration in time range)    Initial Impression / Assessment and Plan / UC Course  I have reviewed the triage vital signs and the nursing notes.  Pertinent labs & imaging results that were available during my care of the patient were reviewed by me and considered in my medical decision making (see chart for details).     Vitals and triage reviewed, patient is hemodynamically stable.  Presentation consistent with left-sided neck muscle spasm.  No cervical spine tenderness, step-off or deformity.  Atraumatic.  Imaging deferred at this time.  Provided with a intramuscular steroid injection to help with spasming as well as muscle relaxers.  Symptomatic management reviewed.  Plan of care, follow-up care return precautions given, no questions at this time.  Work note provided.  Final Clinical Impressions(s) / UC Diagnoses   Final diagnoses:   Muscle spasms of neck     Discharge Instructions      We have given you an intramuscular injection to help with your muscle spasm.  You can also take the muscle relaxer up to 2 times daily.  Do not drink alcohol or drive  on these medications as it may cause drowsiness.  Continue with the ibuprofen will help with inflammation.  You can use heat and gentle stretching as tolerated.  Symptoms should improve over the next few days.  If no improvement or any changes please follow-up with sports medicine for further evaluation.     ED Prescriptions     Medication Sig Dispense Auth. Provider   methocarbamol  (ROBAXIN ) 500 MG tablet Take 1 tablet (500 mg total) by mouth 2 (two) times daily. 20 tablet Dreama, Carder Yin  N, FNP      PDMP not reviewed this encounter.   Dreama, Laticha Ferrucci  N, [place] 11/26/23 [id number]

## 2023-11-26 NOTE — Discharge Instructions (Addendum)
 We have given you an intramuscular injection to help with your muscle spasm.  You can also take the muscle relaxer up to 2 times daily.  Do not drink alcohol or drive on these medications as it may cause drowsiness.  Continue with the ibuprofen will help with inflammation.  You can use heat and gentle stretching as tolerated.  Symptoms should improve over the next few days.  If no improvement or any changes please follow-up with sports medicine for further evaluation.

## 2023-11-26 NOTE — ED Triage Notes (Signed)
 Patient presents to the office for neck pain x 3 days. No known injury or trauma to his neck.

## 2023-11-27 ENCOUNTER — Encounter (HOSPITAL_BASED_OUTPATIENT_CLINIC_OR_DEPARTMENT_OTHER): Payer: Self-pay | Admitting: Surgical

## 2023-11-29 DIAGNOSIS — M542 Cervicalgia: Secondary | ICD-10-CM | POA: Diagnosis not present

## 2023-11-29 DIAGNOSIS — M25512 Pain in left shoulder: Secondary | ICD-10-CM | POA: Diagnosis not present

## 2023-11-30 DIAGNOSIS — N401 Enlarged prostate with lower urinary tract symptoms: Secondary | ICD-10-CM | POA: Diagnosis not present

## 2023-11-30 DIAGNOSIS — E7800 Pure hypercholesterolemia, unspecified: Secondary | ICD-10-CM | POA: Diagnosis not present

## 2023-11-30 DIAGNOSIS — I10 Essential (primary) hypertension: Secondary | ICD-10-CM | POA: Diagnosis not present

## 2023-11-30 DIAGNOSIS — E1169 Type 2 diabetes mellitus with other specified complication: Secondary | ICD-10-CM | POA: Diagnosis not present

## 2023-12-01 ENCOUNTER — Other Ambulatory Visit (HOSPITAL_BASED_OUTPATIENT_CLINIC_OR_DEPARTMENT_OTHER): Payer: Self-pay | Admitting: Surgical

## 2023-12-01 DIAGNOSIS — M542 Cervicalgia: Secondary | ICD-10-CM | POA: Diagnosis not present

## 2023-12-01 DIAGNOSIS — L4050 Arthropathic psoriasis, unspecified: Secondary | ICD-10-CM

## 2023-12-01 NOTE — Telephone Encounter (Signed)
Regarding: Clinical Question  ----- Message from [facility] sent at 12/01/2023  3:59 PM EST -----  Copied From CRM #[id number].[name], Chase, Chase    [name], Chase L called with a clinical question. Pt insurance is now requiring pt use [facility]. Needing a new rx sent to them. Thank you!    HUMIRA,CF, PEN 40 mg/0.4 mL Subcutaneous Pen Injector Kit    [facility] - [place], [place] - [facility] AT Portal to Registered [facility]   Phone: [phone]  Fax: [phone]

## 2023-12-02 DIAGNOSIS — M459 Ankylosing spondylitis of unspecified sites in spine: Secondary | ICD-10-CM | POA: Diagnosis not present

## 2023-12-02 MED ORDER — HUMIRA(CF) PEN 40 MG/0.4 ML SUBCUTANEOUS KIT
PEN_INJECTOR | SUBCUTANEOUS | 5 refills | Status: DC
Start: 2023-12-02 — End: 2023-12-06

## 2023-12-03 ENCOUNTER — Encounter (INDEPENDENT_AMBULATORY_CARE_PROVIDER_SITE_OTHER): Payer: Self-pay | Admitting: Family Medicine

## 2023-12-03 ENCOUNTER — Other Ambulatory Visit (INDEPENDENT_AMBULATORY_CARE_PROVIDER_SITE_OTHER): Payer: Self-pay | Admitting: Family Medicine

## 2023-12-03 MED ORDER — MOUNJARO 7.5 MG/0.5 ML SUBCUTANEOUS PEN INJECTOR
7.5000 mg | PEN_INJECTOR | SUBCUTANEOUS | 3 refills | Status: DC
Start: 2023-12-03 — End: 2023-12-03

## 2023-12-03 MED ORDER — MOUNJARO 7.5 MG/0.5 ML SUBCUTANEOUS PEN INJECTOR
7.5000 mg | PEN_INJECTOR | SUBCUTANEOUS | 3 refills | Status: DC
Start: 2023-12-03 — End: 2023-12-05

## 2023-12-05 ENCOUNTER — Other Ambulatory Visit (INDEPENDENT_AMBULATORY_CARE_PROVIDER_SITE_OTHER): Payer: Self-pay | Admitting: Family Medicine

## 2023-12-05 MED ORDER — MOUNJARO 7.5 MG/0.5 ML SUBCUTANEOUS PEN INJECTOR
7.5000 mg | PEN_INJECTOR | SUBCUTANEOUS | 3 refills | Status: DC
Start: 2023-12-05 — End: 2024-06-12

## 2023-12-06 ENCOUNTER — Other Ambulatory Visit: Payer: Self-pay

## 2023-12-06 ENCOUNTER — Telehealth (HOSPITAL_BASED_OUTPATIENT_CLINIC_OR_DEPARTMENT_OTHER): Payer: Self-pay | Admitting: Surgical

## 2023-12-06 DIAGNOSIS — M542 Cervicalgia: Secondary | ICD-10-CM | POA: Diagnosis not present

## 2023-12-06 DIAGNOSIS — L4050 Arthropathic psoriasis, unspecified: Secondary | ICD-10-CM

## 2023-12-06 DIAGNOSIS — Z79899 Other long term (current) drug therapy: Secondary | ICD-10-CM

## 2023-12-06 MED ORDER — HUMIRA(CF) PEN 40 MG/0.4 ML SUBCUTANEOUS KIT
PEN_INJECTOR | SUBCUTANEOUS | 5 refills | Status: DC
Start: 2023-12-06 — End: 2023-12-14

## 2023-12-06 NOTE — Telephone Encounter (Signed)
Humira sent to speciality pharmacy since needs new auth with new pharmacy

## 2023-12-06 NOTE — Nursing Note (Signed)
Forms received from [facility] to be completed by the provider    Eston Mould, CMA

## 2023-12-06 NOTE — Nursing Note (Signed)
Prior authorization forms have been placed in providers box to be completed    Eston Mould, CMA

## 2023-12-06 NOTE — Telephone Encounter (Signed)
Benefits investigation completed. Outreach attempt was made to inform that prescription for Humira is needing prior authorization.  The medication has been submitted through ePA (electronic prior authorization system). Waiting for response from payor.    Call is also to offer our services that if medication is approved and insurance allows, we will find copay assistance if needed, delivery to home/ clinic at no extra cost, and that we offer one on one Pharmacist counseling.     Result: Spoke with patient/guardian and patient would like to [facility] this [facility]. Patient prefers authorization updates via phone (gave permission to leave detailed VM)    Lenard Lance, Pharmacy Technician 12/06/2023

## 2023-12-06 NOTE — Telephone Encounter (Signed)
Prior authorization required for prescription Humira received by [facility] on 12/06/2023.  Benefits investigation to be completed.

## 2023-12-07 NOTE — Telephone Encounter (Signed)
Called patient as his insurance is now requiring a see use adalimumab ADAZ  Verified with patient that he is agreeable to use the bio similar.  Also explained that insurance is requiring an updated TB test will place that order and he can get it [facility]

## 2023-12-07 NOTE — Nursing Note (Signed)
Called and spoke with [facility] was instructed that patient will need a TB test and also the preferred drug was adalimumab ADAZ. Provider spoke with patient and patient was agreeable as to the switch along with provider prior authorization was done through the phone. Requested supporting documents showing patient is stable on previous humira was faxed with confirmation    Eston Mould, CMA

## 2023-12-10 NOTE — Telephone Encounter (Signed)
Ins changed preferred to adalimumab-adaz per provider. Pt aware and agreeable to biosimilar. Waiting on TB test. Nurse from clinic already faxed over documents showing patient previously on humira. Continuing to track

## 2023-12-12 ENCOUNTER — Ambulatory Visit (HOSPITAL_COMMUNITY): Payer: BC Managed Care – PPO

## 2023-12-12 ENCOUNTER — Other Ambulatory Visit: Payer: Self-pay

## 2023-12-12 ENCOUNTER — Ambulatory Visit: Payer: BC Managed Care – PPO | Attending: Family Medicine

## 2023-12-12 DIAGNOSIS — Z79899 Other long term (current) drug therapy: Secondary | ICD-10-CM | POA: Insufficient documentation

## 2023-12-12 DIAGNOSIS — L4050 Arthropathic psoriasis, unspecified: Secondary | ICD-10-CM | POA: Insufficient documentation

## 2023-12-12 LAB — CBC WITH DIFF
BASOPHIL #: 0 10*3/uL (ref 0.00–0.30)
BASOPHIL %: 1 % (ref 0–3)
EOSINOPHIL #: 0.1 10*3/uL (ref 0.00–0.50)
EOSINOPHIL %: 3 % (ref 0–7)
HCT: 46.7 % (ref 43.5–53.7)
HGB & HCT check: 0.4
HGB: 15.7 g/dL (ref 14.1–18.1)
LYMPHOCYTE #: 2.1 10*3/uL (ref 0.90–4.80)
LYMPHOCYTE %: 36 % (ref 10–50)
MCH: 30.3 pg (ref 27.0–31.2)
MCHC: 33.5 g/dL (ref 31.8–35.4)
MCV: 90.4 fL (ref 80.0–97.0)
MONOCYTE #: 0.6 10*3/uL (ref 0.30–0.90)
MONOCYTE %: 10 % (ref 0–12)
NEUTROPHIL #: 2.9 10*3/uL (ref 1.70–7.00)
NEUTROPHIL %: 51 % (ref 37–80)
PLATELETS: 208 10*3/uL (ref 142–424)
RBC: 5.17 10*6/uL (ref 4.69–6.13)
RDW: 13.8 % (ref 11.6–14.9)
WBC: 5.8 10*3/uL (ref 4.6–10.2)

## 2023-12-13 ENCOUNTER — Other Ambulatory Visit: Payer: Self-pay

## 2023-12-13 NOTE — Telephone Encounter (Addendum)
Specialty Pharmacy Note    Prior Authorization for medication Adalimumab-ADAZ has been approved by payor [facility] until 12/06/2024.  Approval notice has been scanned into Epic and can be found in the Media tab. Per the Insurance's Requirement the [facility]. Sw pt to update. Informed we cannot see copay as we are not contracted with ins. Advised that there are copay cards available and if these do not make it affordable, that he should reach out to the clinic. Pt VU    This signed encounter has been routed to the provider's office noting the completion of prior authorization and informing that further action is needed from clinic due to script for adalimumab-adaz needs sent to [facility]    If you have any questions, don't hesitate to contact the Specialty Pharmacy Lenard Lance, Pharmacy Technician

## 2023-12-14 ENCOUNTER — Other Ambulatory Visit (HOSPITAL_BASED_OUTPATIENT_CLINIC_OR_DEPARTMENT_OTHER): Payer: Self-pay | Admitting: Surgical

## 2023-12-14 DIAGNOSIS — L4050 Arthropathic psoriasis, unspecified: Secondary | ICD-10-CM

## 2023-12-14 LAB — QUANTIFERON TB1: QFT TB1: 0.01 [IU]/mL

## 2023-12-14 LAB — QUANTIFERON TB MITOGEN: QFT MITOGEN: >10 [IU]/mL

## 2023-12-14 LAB — QUANTIFERON
QFT MITOGEN: >10 [IU]/mL
QFT NIL: 0.01 [IU]/mL
QFT QUALITATIVE: NEGATIVE
QFT TB1: 0.01 [IU]/mL
QFT TB2: 0.01 [IU]/mL

## 2023-12-14 LAB — QUANTIFERON TB NIL: QFT NIL: 0.01 [IU]/mL

## 2023-12-14 LAB — QUANTIFERON TB2: QFT TB2: 0.01 [IU]/mL

## 2023-12-14 MED ORDER — ADALIMUMAB-ADAZ 40 MG/0.4 ML SUBCUTANEOUS PEN INJECTOR: 40.0000 mg | PEN_INJECTOR | SUBCUTANEOUS | 3 refills | Status: DC

## 2023-12-14 NOTE — Nursing Note (Signed)
Prior Authorization for medication Adalimumab-ADAZ has been approved by payor [facility] until 12/06/2024.  Approval notice has been scanned into Epic and can be found in the Media tab. Per the Insurance's Requirement the [facility]. Sw pt to update. Informed we cannot see copay as we are not contracted with ins. Advised that there are copay cards available and if these do not make it affordable, that he should reach out to the clinic. Pt VU     This signed encounter has been routed to the provider's office noting the completion of prior authorization and informing that further action is needed from clinic due to script for adalimumab-adaz needs sent to [facility]     If you have any questions, don't hesitate to contact the Specialty Pharmacy Lenard Lance, Pharmacy Technician      Patient and provider has been made aware of this approval    Eston Mould, [facility]  12/14/2023 06:31

## 2023-12-16 ENCOUNTER — Other Ambulatory Visit: Payer: Self-pay

## 2023-12-16 ENCOUNTER — Encounter (HOSPITAL_BASED_OUTPATIENT_CLINIC_OR_DEPARTMENT_OTHER): Payer: Self-pay | Admitting: Surgical

## 2023-12-16 ENCOUNTER — Ambulatory Visit: Payer: BC Managed Care – PPO | Attending: NURSE PRACTITIONER | Admitting: NURSE PRACTITIONER

## 2023-12-16 ENCOUNTER — Encounter (INDEPENDENT_AMBULATORY_CARE_PROVIDER_SITE_OTHER): Payer: Self-pay | Admitting: NURSE PRACTITIONER

## 2023-12-16 VITALS — BP 136/96 | HR 88

## 2023-12-16 DIAGNOSIS — Z87442 Personal history of urinary calculi: Secondary | ICD-10-CM | POA: Insufficient documentation

## 2023-12-16 DIAGNOSIS — N400 Enlarged prostate without lower urinary tract symptoms: Secondary | ICD-10-CM | POA: Insufficient documentation

## 2023-12-16 LAB — POCT URINE DIPSTICK
BILIRUBIN: NEGATIVE
BLOOD: NEGATIVE
GLUCOSE: NEGATIVE
KETONE: NEGATIVE
LEUKOCYTES: NEGATIVE
NITRITE: NEGATIVE
PH: 5
PROTEIN: NEGATIVE
SPECIFIC GRAVITY: 1.025

## 2023-12-16 MED ORDER — TADALAFIL 5 MG TABLET
5.0000 mg | ORAL_TABLET | Freq: Every day | ORAL | 3 refills | Status: DC
Start: 2023-12-16 — End: 2024-02-29

## 2023-12-16 NOTE — Progress Notes (Addendum)
UROLOGY, [facility]  [address]  [place] [address]  Operated by [facility]    CHIEF COMPLAINT:   Chief Complaint   Patient presents with    Benign Prostatic Hypertrophy     New pt with HX of BPH and KS. His former urologist in [place] retired . Here to get EST and has no complaints     Kidney Stones       HPI: Chase [name] is a 58 y.o. male who presents in clinic for new patient establishing visit.     Patient is new to our office, having his previous Urologist was from [place] is no longer in practice and needed to have his care transferred.     Indicates a history of kidney stones occurring around 2012, requiring surgical intervention with urethroscopy. Occurring a second time a year later, no other stolen history since.     He has been doing great since the tadalafil was started years ago.  No reported side effects.  His urinary symptoms are without bother, without therapy he was having urgency, nocturia, and did not feel as through he was emptying well.         KUB:  12/12/2021 No definite renal or ureteral stones    Urine:  12/12/2021 0 RBC, 0 WBC     CRE:  10/11/2023 1.04  11/20/2022 1.19    PSA:  10/11/2023 0.28  12/06/2021 0.29  12/06/2020 0.2  11/25/2018 0.3    MEDS:  Tadalafil 5 mg daily- No reported side effects     History:  Family History of Prostate Cancer Denies   Family History of Bladder Cancer Denies   Family History of Kidney Cancer Denies   Family History of Kidney Stones Denies   Previous smoker of cigars, socially.     Voiding symptoms    Dysuria  Denies   Gross hematuria Denies   Incontinence   Denies   Pads per day  Denies   Hx of UTI  Denies   Hx of retention  Denies   Nocturia   0     Bowel movements occur daily; without issues, concerns, or changes.     Daily Fluid Intake:    Water  80 oz   Coffee  32 oz   Tea  16 oz  Soft Drinks 0  Juice  0  Milk  12 oz   Energy Drink 0  Alcohol 0      Last BMP  (Last result in the past 2 years)        Na   K   Cl    CO2   BUN   Cr   Calcium   Glucose   Glucose-Fasting        10/11/23 1011 139   4.9   104   27   15   1.04   9.9  Comment: Gadolinium-containing contrast can interfere with calcium measurement.     120               Lab Results   Component Value Date    CREATININE 1.04 10/11/2023     Last CBC  (Last result in the past 2 years)        WBC   HGB   HCT   MCV   Platelets      12/12/23 0809 5.8   15.7   46.7   90.4   208  No results found for: "VITD25"]    No results found for: "IPTH"    No results found for this or any previous visit (from the past [id number] hour(s)).    Lab Results   Component Value Date    URINECX No significant growth 48 hours. 12/12/2021       No results found for: "[id number]"    $&    No Known Allergies    Outpatient Medications Marked as Taking for the 12/16/23 encounter (Office Visit) with Barnet Glasgow, NP   Medication Sig Dispense Refill    adalimumab-adaz 40 mg/0.4 mL Subcutaneous Pen Injector Inject 0.4 mL (40 mg total) under the skin Every 14 days 2.4 mL 3    Blood Sugar Diagnostic (ONETOUCH VERIO TEST STRIPS) Strip 1 Strip by In Vitro route Three times a day 400 Each 3    Blood-Glucose Meter (ONETOUCH VERIO IQ METER) Misc 1 Each by In Vitro route Three times a day 1 Each 0    enalapril maleate (VASOTEC) 10 mg Oral Tablet Take 1 Tablet (10 mg total) by mouth Once a day 90 Tablet 3    lancets (ONETOUCH SURESOFT LANCING DEV) 28 gauge Misc 1 Each by In Vitro route Three times a day 400 Each 3    metoprolol succinate (TOPROL-XL) 50 mg Oral Tablet Sustained Release 24 hr Take 1 Tablet (50 mg total) by mouth Once a day 90 Tablet 3    rosuvastatin (CRESTOR) 20 mg Oral Tablet Take 1 Tablet (20 mg total) by mouth Every evening 90 Tablet 3    tadalafil (CIALIS) 5 mg Oral Tablet Take 1 Tablet (5 mg total) by mouth Once a day 90 Tablet 3    tirzepatide (MOUNJARO) 7.5 mg/0.5 mL Subcutaneous Pen Injector Inject 0.5 mL (7.5 mg total) under the skin Every 7 days 6 mL 3     Past Medical History:    Diagnosis Date    Arthritis with psoriasis (CMS HCC)     Arthropathy     psoriatic    CAD (coronary artery disease)     Diabetes mellitus, type 2 (CMS HCC) 03/19/2009    Fracture of distal end of tibia     left    Fracture of distal fibula     left    GERD (gastroesophageal reflux disease)     Hypertension     Kidney stones     Right shoulder pain     Type 2 diabetes mellitus (CMS HCC)     Urinary calculus, unspecified     Renal stones         Past Surgical History:   Procedure Laterality Date    COLONOSCOPY N/A 10/23/2016    Performed by Donney Rankins, MD at [facility] OR ENDO    COLONOSCOPY DIAGNOSTIC N/A 10/15/2022    Performed by Bunnie Philips, MD at [facility] OR ENDO    HX APPENDECTOMY      2017    HX CARPAL TUNNEL RELEASE  1990    bil    HX HEART CATHETERIZATION  11/09    HX HERNIA REPAIR  1970    2010    LITHOTRIPSY  2004    PB LAP UMBILICAL HERNIA REPAIR  2009     Family Medical History:       Problem Relation (Age of Onset)    Cancer Father    Diabetes Mother, Father    Hypertension (High Blood Pressure) Mother            Social History  Socioeconomic History    Marital status: Married     Spouse name: Not on file    Number of children: 3    Years of education: Not on file    Highest education level: Not on file   Occupational History    Not on file   Tobacco Use    Smoking status: Former     Types: Cigars    Smokeless tobacco: Former     Types: Snuff     Quit date: 10/26/2019    Tobacco comments:     3-4 a week   Vaping Use    Vaping status: Never Used   Substance and Sexual Activity    Alcohol use: Yes     Comment: occassionally    Drug use: No    Sexual activity: Not on file   Other Topics Concern    Ability to Walk 1 Flight of Steps without SOB/CP Not Asked    Routine Exercise Not Asked    Ability to Walk 2 Flight of Steps without SOB/CP Not Asked    Unable to Ambulate Not Asked    Total Care Not Asked    Ability To Do Own ADL's Not Asked    Uses Walker Not Asked    Other Activity Level Not Asked    Uses Cane  Not Asked   Social History Narrative    Not on file     Social Determinants of Health     Financial Resource Strain: Not on file   Transportation Needs: Not on file   Social Connections: Low Risk  (10/15/2022)    Social Connections     SDOH Social Isolation: 5 or more times a week   Intimate Partner Violence: Not on file   Housing Stability: Not on file         The Medication, Surgical, Medical, Allergies, Social and Family history stated above was reviewed with the patient during this visit.    REVIEW OF SYSTEMS:  The Review of Systems is per the HPI, All pertinent positives are indicated and All other systems were reviewed and are negative        PHYSICAL EXAM:  General Appearance:  Well developed, well nourished.  In no acute distress.  Fully alert and oriented.  Vital signs: BP (!) 136/96   Pulse 88      Skin:  Soft and dry without rashes.  HEENT:  Normocephalic, atraumatic.  Sclerae white. Extraocular movements intact. Mucous membranes moist.  Neck: Trachea midline.   Respiratory:   Normal symmetric bilateral excursion and effort.  Cardiovascular:  Upper extremity warm, well perfused, no edema.  Back: No costovertebral angle tenderness.  Abdomen:  Soft, nontender, nondistended.    Psychiatric: Normal mood and affect.  Neurologic: Gait normal  Genitourinary:  Asymmetrical prostate, right more full.  No firmness, no nodules.  45 grams.     $!RADIOLOGY/LABS:    Urine Dip  LEUKOCYTES   Date Value Ref Range Status   12/16/2023 neg  Final     NITRITE   Date Value Ref Range Status   12/16/2023 neg  Final     UROBILINOGEN   Date Value Ref Range Status   12/16/2023 norm  Final     PROTEIN   Date Value Ref Range Status   12/16/2023 neg  Final     PH   Date Value Ref Range Status   12/16/2023 5  Final     BLOOD   Date Value Ref Range Status  12/16/2023 neg  Final     SPECIFIC GRAVITY   Date Value Ref Range Status   12/16/2023 1.025  Final     KETONE   Date Value Ref Range Status   12/16/2023 neg  Final     BILIRUBIN    Date Value Ref Range Status   12/16/2023 neg  Final     GLUCOSE   Date Value Ref Range Status   12/16/2023 neg  Final       ASSESSMENT AND PLAN: Chase [name] is a 58 y.o. male with BPH without LUTS.     - Urinary symptoms significantly improved with tadalafil therapy.  No current symptom or side effects.  Recommend to continue daily therapy.  Reviewed side effects, if chest pain, shortness of breath, or priapism, to go to the ER.     - Prostate can screening by PCP, continually yearly screening    - No reoccurrence kidney stones, KUB in 1 year to monitor.     Patient educated if they were to develop intolerable pain, inability to tolerate oral intake, or fever/chills; they are to go to the Emergency Room.    Orders Placed This Encounter    XR KUB    CANCELED: POCT PVR    POCT URINE DIPSTICK    tadalafil (CIALIS) 5 mg Oral Tablet       Barnet Glasgow FNP-C  Advanced Practice Provider  [facility]  Department of Urology        Portions of this note may be dictated using voice recognition software or a dictation device.  Variances in spelling and vocabulary are possible and unintentional.  Not all errors are caught and corrected.  Please notify the Thereasa Parkin if any discrepancies are noted or if the meaning of any statement is not clear.

## 2023-12-16 NOTE — Patient Instructions (Signed)
Tadalafil 5 mg daily, if chest pain, shortness of breath, or priapism, to go to the ER.     X- ray in 1 year before follow up     Ensure PCP continues prostate cancer screening     Notify office of any changes, issues, or concerns.     Patient educated if they were to develop intolerable pain, inability to tolerate oral intake, or fever/chills; they are to go to the Emergency Room.

## 2023-12-20 DIAGNOSIS — M542 Cervicalgia: Secondary | ICD-10-CM | POA: Diagnosis not present

## 2023-12-30 DIAGNOSIS — M459 Ankylosing spondylitis of unspecified sites in spine: Secondary | ICD-10-CM | POA: Diagnosis not present

## 2023-12-31 DIAGNOSIS — Z125 Encounter for screening for malignant neoplasm of prostate: Secondary | ICD-10-CM | POA: Diagnosis not present

## 2023-12-31 DIAGNOSIS — E7800 Pure hypercholesterolemia, unspecified: Secondary | ICD-10-CM | POA: Diagnosis not present

## 2023-12-31 DIAGNOSIS — E1169 Type 2 diabetes mellitus with other specified complication: Secondary | ICD-10-CM | POA: Diagnosis not present

## 2024-01-21 DIAGNOSIS — H04123 Dry eye syndrome of bilateral lacrimal glands: Secondary | ICD-10-CM | POA: Diagnosis not present

## 2024-01-21 DIAGNOSIS — H2513 Age-related nuclear cataract, bilateral: Secondary | ICD-10-CM | POA: Diagnosis not present

## 2024-01-21 DIAGNOSIS — E119 Type 2 diabetes mellitus without complications: Secondary | ICD-10-CM | POA: Diagnosis not present

## 2024-01-21 DIAGNOSIS — H524 Presbyopia: Secondary | ICD-10-CM | POA: Diagnosis not present

## 2024-01-30 ENCOUNTER — Encounter (INDEPENDENT_AMBULATORY_CARE_PROVIDER_SITE_OTHER): Payer: Self-pay | Admitting: Family Medicine

## 2024-01-31 ENCOUNTER — Other Ambulatory Visit (INDEPENDENT_AMBULATORY_CARE_PROVIDER_SITE_OTHER): Payer: Self-pay | Admitting: Family Medicine

## 2024-01-31 DIAGNOSIS — I10 Essential (primary) hypertension: Secondary | ICD-10-CM

## 2024-01-31 MED ORDER — METOPROLOL SUCCINATE ER 25 MG TABLET,EXTENDED RELEASE 24 HR
25.0000 mg | ORAL_TABLET | Freq: Every day | ORAL | 1 refills | Status: DC
Start: 2024-01-31 — End: 2024-04-05

## 2024-02-01 DIAGNOSIS — M459 Ankylosing spondylitis of unspecified sites in spine: Secondary | ICD-10-CM | POA: Diagnosis not present

## 2024-02-01 DIAGNOSIS — E1169 Type 2 diabetes mellitus with other specified complication: Secondary | ICD-10-CM | POA: Diagnosis not present

## 2024-02-01 DIAGNOSIS — M461 Sacroiliitis, not elsewhere classified: Secondary | ICD-10-CM | POA: Diagnosis not present

## 2024-02-01 DIAGNOSIS — E785 Hyperlipidemia, unspecified: Secondary | ICD-10-CM | POA: Diagnosis not present

## 2024-02-20 ENCOUNTER — Encounter (INDEPENDENT_AMBULATORY_CARE_PROVIDER_SITE_OTHER): Payer: Self-pay | Admitting: NURSE PRACTITIONER

## 2024-02-20 DIAGNOSIS — R350 Frequency of micturition: Secondary | ICD-10-CM

## 2024-02-21 ENCOUNTER — Other Ambulatory Visit: Payer: Self-pay

## 2024-02-21 ENCOUNTER — Ambulatory Visit: Attending: NURSE PRACTITIONER

## 2024-02-21 DIAGNOSIS — R350 Frequency of micturition: Secondary | ICD-10-CM | POA: Insufficient documentation

## 2024-02-21 DIAGNOSIS — N401 Enlarged prostate with lower urinary tract symptoms: Secondary | ICD-10-CM | POA: Insufficient documentation

## 2024-02-21 LAB — URINALYSIS, MACRO/MICRO
BILIRUBIN: NEGATIVE mg/dL
BLOOD: NEGATIVE mg/dL
GLUCOSE: NEGATIVE mg/dL
KETONES: NEGATIVE mg/dL
LEUKOCYTES: NEGATIVE WBCs/uL
NITRITE: NEGATIVE
PH: 6.5 (ref 5.0–8.5)
PROTEIN: NEGATIVE mg/dL
SPECIFIC GRAVITY: 1.02 (ref 1.005–1.020)
UROBILINOGEN: 0.2 mg/dL

## 2024-02-21 NOTE — Telephone Encounter (Signed)
 I am ordering a urine test, complete so we can make sure no infection.  I would drink some more water to help flush out any irritation.  You can double up tadalafil every other day, watch for those side effects of dizziness. Notify if no better within 1 week.

## 2024-02-21 NOTE — Telephone Encounter (Signed)
 Pt advised urine test has been ordered and will will submit that today. He will increase his hydration and double up on the Tadalafil every other day. He understands to watch for dizziness and will notify if he gets no better within one week Beverely Low, RN

## 2024-02-28 ENCOUNTER — Ambulatory Visit (HOSPITAL_COMMUNITY)
Admission: RE | Admit: 2024-02-28 | Discharge: 2024-02-28 | Disposition: A | Discharge status: Home / Self Care | Source: Ambulatory Visit | Attending: NURSE PRACTITIONER | Admitting: NURSE PRACTITIONER

## 2024-02-28 ENCOUNTER — Other Ambulatory Visit: Payer: Self-pay

## 2024-02-28 ENCOUNTER — Encounter (INDEPENDENT_AMBULATORY_CARE_PROVIDER_SITE_OTHER): Payer: Self-pay

## 2024-02-28 ENCOUNTER — Ambulatory Visit: Attending: NURSE PRACTITIONER

## 2024-02-28 DIAGNOSIS — Z87442 Personal history of urinary calculi: Secondary | ICD-10-CM | POA: Insufficient documentation

## 2024-02-28 DIAGNOSIS — K5900 Constipation, unspecified: Secondary | ICD-10-CM | POA: Insufficient documentation

## 2024-02-28 DIAGNOSIS — Z79899 Other long term (current) drug therapy: Secondary | ICD-10-CM | POA: Insufficient documentation

## 2024-02-28 LAB — POCT PVR: URINE TOTAL VOLUME: 0

## 2024-02-28 NOTE — Telephone Encounter (Signed)
 Pt would like to come in for a PVR. Appt arranged today at 1:30 pm. Jefferey Minerva, RN

## 2024-02-28 NOTE — Telephone Encounter (Signed)
 MyChart reply from patient

## 2024-02-29 ENCOUNTER — Ambulatory Visit (INDEPENDENT_AMBULATORY_CARE_PROVIDER_SITE_OTHER): Payer: Self-pay | Admitting: NURSE PRACTITIONER

## 2024-02-29 MED ORDER — TAMSULOSIN 0.4 MG CAPSULE
0.4000 mg | ORAL_CAPSULE | Freq: Every evening | ORAL | 4 refills | Status: DC
Start: 2024-02-29 — End: 2024-08-17

## 2024-02-29 NOTE — Progress Notes (Signed)
 Based on symptoms, patient medications reviewed.  Agree to adjust his current tadalafil therapy to tamsulosin 0.4 mg daily.  Side effects are understood.  He will also complete a KUB now to review his flank pain. He is aware to stop tadalafil for 48 hours before start of tamsulosin, he had concerns of hypotension with tadalafil.

## 2024-02-29 NOTE — Telephone Encounter (Signed)
 Pt LMOVM on nurse line asking if tamsulosin has been sent to [facility]. Advised it has been sent.Susanne Epps, RN

## 2024-02-29 NOTE — Addendum Note (Signed)
 Addended by: Cranford Dixons on: 02/29/2024 08:10 AM     Modules accepted: Orders

## 2024-02-29 NOTE — Telephone Encounter (Signed)
 MyChart message from pt from last night.

## 2024-03-01 ENCOUNTER — Telehealth (HOSPITAL_COMMUNITY): Payer: Self-pay | Admitting: Adult Medicine

## 2024-03-01 DIAGNOSIS — M459 Ankylosing spondylitis of unspecified sites in spine: Secondary | ICD-10-CM | POA: Diagnosis not present

## 2024-03-27 ENCOUNTER — Encounter (INDEPENDENT_AMBULATORY_CARE_PROVIDER_SITE_OTHER): Payer: Self-pay | Admitting: NURSE PRACTITIONER

## 2024-03-27 ENCOUNTER — Ambulatory Visit (HOSPITAL_COMMUNITY): Payer: Self-pay | Admitting: Adult Medicine

## 2024-03-28 NOTE — Telephone Encounter (Signed)
 Pt advised he can opt to stop the Flomax  due to side effects and will go back on the Cialis  5 mg daily. He will call if he has any other concerns or questions . Jefferey Minerva, RN

## 2024-03-29 DIAGNOSIS — M459 Ankylosing spondylitis of unspecified sites in spine: Secondary | ICD-10-CM | POA: Diagnosis not present

## 2024-04-05 ENCOUNTER — Encounter (HOSPITAL_COMMUNITY): Payer: Self-pay | Admitting: Adult Medicine

## 2024-04-05 ENCOUNTER — Ambulatory Visit: Payer: Self-pay | Attending: Adult Medicine | Admitting: Adult Medicine

## 2024-04-05 ENCOUNTER — Other Ambulatory Visit: Payer: Self-pay

## 2024-04-05 VITALS — BP 128/78 | HR 68 | Ht 68.5 in | Wt 179.4 lb

## 2024-04-05 DIAGNOSIS — N209 Urinary calculus, unspecified: Secondary | ICD-10-CM | POA: Insufficient documentation

## 2024-04-05 DIAGNOSIS — L4050 Arthropathic psoriasis, unspecified: Secondary | ICD-10-CM | POA: Insufficient documentation

## 2024-04-05 DIAGNOSIS — Z87891 Personal history of nicotine dependence: Secondary | ICD-10-CM | POA: Insufficient documentation

## 2024-04-05 DIAGNOSIS — E119 Type 2 diabetes mellitus without complications: Secondary | ICD-10-CM

## 2024-04-05 DIAGNOSIS — Z23 Encounter for immunization: Secondary | ICD-10-CM | POA: Insufficient documentation

## 2024-04-05 DIAGNOSIS — Z7985 Long-term (current) use of injectable non-insulin antidiabetic drugs: Secondary | ICD-10-CM | POA: Insufficient documentation

## 2024-04-05 DIAGNOSIS — E1100 Type 2 diabetes mellitus with hyperosmolarity without nonketotic hyperglycemic-hyperosmolar coma (NKHHC): Secondary | ICD-10-CM | POA: Insufficient documentation

## 2024-04-05 DIAGNOSIS — I10 Essential (primary) hypertension: Secondary | ICD-10-CM | POA: Insufficient documentation

## 2024-04-05 DIAGNOSIS — Z0000 Encounter for general adult medical examination without abnormal findings: Secondary | ICD-10-CM | POA: Insufficient documentation

## 2024-04-05 DIAGNOSIS — E7800 Pure hypercholesterolemia, unspecified: Secondary | ICD-10-CM | POA: Insufficient documentation

## 2024-04-05 DIAGNOSIS — D126 Benign neoplasm of colon, unspecified: Secondary | ICD-10-CM

## 2024-04-05 MED ORDER — ENALAPRIL MALEATE 10 MG TABLET
10.0000 mg | ORAL_TABLET | Freq: Every day | ORAL | 1 refills | Status: DC
Start: 2024-04-05 — End: 2024-09-04

## 2024-04-05 MED ORDER — ONETOUCH VERIO TEST STRIPS
1.0000 | ORAL_STRIP | Freq: Three times a day (TID) | 3 refills | Status: AC
Start: 2024-04-05 — End: ?

## 2024-04-05 MED ORDER — ROSUVASTATIN 20 MG TABLET
20.0000 mg | ORAL_TABLET | Freq: Every evening | ORAL | 1 refills | Status: DC
Start: 2024-04-05 — End: 2024-09-04

## 2024-04-05 MED ORDER — METOPROLOL SUCCINATE ER 25 MG TABLET,EXTENDED RELEASE 24 HR
25.0000 mg | ORAL_TABLET | Freq: Every day | ORAL | 1 refills | Status: DC
Start: 2024-04-05 — End: 2024-09-22

## 2024-04-05 NOTE — Assessment & Plan Note (Signed)
 Colonoscopy due in November of 2028

## 2024-04-05 NOTE — Assessment & Plan Note (Signed)
 Doing well on Mounjaro  will check A1c patient states he has lost a total of 40 lb.  Diabetic eye exam up-to-date will obtain results

## 2024-04-05 NOTE — Progress Notes (Signed)
 FAMILY MEDICINE, [facility] [address]  [place] [address]  Operated by [facility]          NAME: Chase [name] DOS: 04/05/2024   MRN: [id number] DOB: 1966-03-03     Chief Complaint: Annual Exam and New Patient      History of Present Illness: Chase [name] is a 58 y.o. male who comes in today for yearly Exam.       Medical History     I have reviewed and updated as appropriate the past medical, surgical, family, and social history today:  Medical History/Surgical History/Family History/Social History  Past Medical History:   Diagnosis Date    Arthritis with psoriasis (CMS HCC)     Arthropathy     psoriatic    CAD (coronary artery disease)     Diabetes mellitus, type 2 03/19/2009    Fracture of distal end of tibia     left    Fracture of distal fibula     left    GERD (gastroesophageal reflux disease)     Hypertension     Kidney stones     Right shoulder pain     Type 2 diabetes mellitus     Urinary calculus, unspecified     Renal stones         Past Surgical History:   Procedure Laterality Date    COLONOSCOPY N/A 10/23/2016    Performed by Alene Husk, MD at [facility] OR ENDO    COLONOSCOPY DIAGNOSTIC N/A 10/15/2022    Performed by Rosezetta Contras, MD at [facility] OR ENDO    HX APPENDECTOMY      2017    HX CARPAL TUNNEL RELEASE  1990    bil    HX HEART CATHETERIZATION  11/09    HX HERNIA REPAIR  1970    2010    LITHOTRIPSY  2004    PB LAP UMBILICAL HERNIA REPAIR  2009     Family Medical History:       Problem Relation (Age of Onset)    Cancer Father    Diabetes Mother, Father    Hypertension (High Blood Pressure) Mother             Social History     Socioeconomic History    Marital status: Married    Number of children: 3   Tobacco Use    Smoking status: Former     Types: Cigars    Smokeless tobacco: Former     Types: Snuff     Quit date: 10/26/2019    Tobacco comments:     3-4 a week   Vaping Use    Vaping status: Never Used   Substance and Sexual Activity    Alcohol use: Yes     Comment:  occassionally    Drug use: No     Social Determinants of Health     Social Connections: Low Risk  (10/15/2022)    Social Connections     SDOH Social Isolation: 5 or more times a week       Allergies:  No Known Allergies    Medications:  Current Outpatient Medications   Medication Sig    adalimumab -adaz 40 mg/0.4 mL Subcutaneous Pen Injector Inject 0.4 mL (40 mg total) under the skin Every 14 days    Blood Sugar Diagnostic (ONETOUCH VERIO TEST STRIPS) Strip 1 Strip by In Vitro route Three times a day    Blood-Glucose Meter (ONETOUCH VERIO IQ  METER) Misc 1 Each by In Vitro route Three times a day    enalapril  maleate (VASOTEC ) 10 mg Oral Tablet Take 1 Tablet (10 mg total) by mouth Daily for 180 days    lancets (ONETOUCH SURESOFT LANCING DEV) 28 gauge Misc 1 Each by In Vitro route Three times a day    metoprolol  succinate (TOPROL -XL) 25 mg Oral Tablet Sustained Release 24 hr Take 1 Tablet (25 mg total) by mouth Daily    ONETOUCH DELICA PLUS LANCET 30 gauge Does not apply Misc     rosuvastatin  (CRESTOR ) 20 mg Oral Tablet Take 1 Tablet (20 mg total) by mouth Every evening for 180 days    tamsulosin  (FLOMAX ) 0.4 mg Oral Capsule Take 1 Capsule (0.4 mg total) by mouth Every evening after dinner    tirzepatide  (MOUNJARO ) 7.5 mg/0.5 mL Subcutaneous Pen Injector Inject 0.5 mL (7.5 mg total) under the skin Every 7 days       Immunizations:  Immunization History   Administered Date(s) Administered    INFLUENZA VIRUS VACCINE (ADMIN) 10/31/2004, 11/05/2005, 10/03/2009    Influenza Vaccine, 6 month-adult 09/09/2010, 09/09/2011, 09/28/2014    Pneumovax 10/31/2004       Problem List:  Patient Active Problem List    Diagnosis Date Noted    Well adult exam 04/05/2024    Tubular adenoma of colon 10/10/2019    Right shoulder pain 09/17/2017    History of kidney stones 04/19/2017    Screening PSA (prostate specific antigen) 02/16/2017    Screen for colon cancer 02/16/2017    Pure hypercholesterolemia 01/23/2010    Back pain 10/03/2009     Type 2 diabetes mellitus with hyperosmolarity without coma, without long-term current use of insulin (CMS HCC)     Essential hypertension     Gastroesophageal reflux disease without esophagitis     Urinary calculus, unspecified     Psoriatic arthritis (CMS HCC)     Fracture of distal end of tibia     Fracture of distal fibula     Coronary artery disease involving native coronary artery without angina pectoris         PHQ Questionnaire  Little interest or pleasure in doing things.: Not at all  Feeling down, depressed, or hopeless: Not at all  PHQ 2 Total: 0           Objective   Vitals:  BP 128/78   Pulse 68   Ht 1.74 m (5' 8.5")   Wt 81.4 kg (179 lb 6.4 oz)   SpO2 99%   BMI 26.88 kg/m       Physical Exam:  Psych: Appropriate affect and mood.     General: In no acute distress, adequately groomed and dressed.  Head: Normocephalic and atraumatic.  Neck: No thyromegaly.  No palpable masses or lymphadenopathy. Trachea mid-line. No carotid bruits.   Ears: canals and Tm's clear bilaterally.  Mouth: Oropharynx and mucosal membranes clear without lesions  Eyes: Pupils equal, round and reactive to light. EOM grossly intact.. Conjunctiva clear.  Respiratory:  Normal respiratory rate. CTA in all fields.  No use of accessory muscles.  Cardiovascular: RRR. No murmurs, rubs or gallops  Abdomen: soft, non-tender and non distended.  No rebound or guarding.     Rectal:  Patient deferred to urology   Musculoskeletal: Gait unremarkable. Moving all 4 extremities. No observed joint swelling.  Extremities: No clubbing cyanosis or edema. Pedal pulses intact bilaterally.   Neuro: Alert, oriented to person, place, time, situation. No abnormal movements  noted. No tremor.  Skin: Dry. No diaphoresis or flushing. No noticeable erythema, abrasions, or lesions on exposed skin.    Assessment/Plan   Diagnosis:  Problem List Items Addressed This Visit          Cardiovascular System    Essential hypertension - Primary    Blood pressure well  controlled on metoprolol  and enalapril .  Was recently decreased on doses blood pressure still well controlled but no longer lightheaded         Pure hypercholesterolemia    Will check CMP and lipids on rosuvastatin  medication refilled         Relevant Medications    rosuvastatin  (CRESTOR ) 20 mg Oral Tablet    Other Relevant Orders    COMPREHENSIVE METABOLIC PNL, FASTING    LIPID PANEL       Nephrology    Urinary calculus, unspecified    Follows with Urology regularly            Digestive    Tubular adenoma of colon    Colonoscopy due in November of 2028            Endocrine    Type 2 diabetes mellitus with hyperosmolarity without coma, without long-term current use of insulin (CMS HCC)    Doing well on Mounjaro  will check A1c patient states he has lost a total of 40 lb.  Diabetic eye exam up-to-date will obtain results         Relevant Medications    Blood Sugar Diagnostic (ONETOUCH VERIO TEST STRIPS) Strip    Other Relevant Orders    HGA1C (HEMOGLOBIN A1C WITH EST AVG GLUCOSE)       Musculoskeletal    Psoriatic arthritis (CMS HCC)    Follows with Rheumatology regularly has been stable on adalimumab -adaz and is currently symptom-free            Other    Well adult exam    Minimal social tobacco history.  Colonoscopy up-to-date.  PSA due November of 2028 but sees Urology.  Long discussion about immunization Tdap given today encouraged to get Shingrix at the pharmacy.  Patient exercises regularly and watches a healthy diet encouraged strength training.  Patient does not live in a house with a basement no risk of Radon          Other Visit Diagnoses         Hypertension        Relevant Medications    metoprolol  succinate (TOPROL -XL) 25 mg Oral Tablet Sustained Release 24 hr    enalapril  maleate (VASOTEC ) 10 mg Oral Tablet    Other Relevant Orders    COMPREHENSIVE METABOLIC PNL, FASTING    CBC/DIFF      Type 2 diabetes mellitus        Relevant Medications    Blood Sugar Diagnostic (ONETOUCH VERIO TEST STRIPS) Strip     Other Relevant Orders    HGA1C (HEMOGLOBIN A1C WITH EST AVG GLUCOSE)               Encounter Medications and Orders  Orders Placed This Encounter    BOOSTRIX -TDAP 76YR & OLDER (ADMIN)    COMPREHENSIVE METABOLIC PNL, FASTING    LIPID PANEL    HGA1C (HEMOGLOBIN A1C WITH EST AVG GLUCOSE)    CBC/DIFF    rosuvastatin  (CRESTOR ) 20 mg Oral Tablet    metoprolol  succinate (TOPROL -XL) 25 mg Oral Tablet Sustained Release 24 hr    enalapril  maleate (VASOTEC ) 10 mg Oral Tablet  Blood Sugar Diagnostic (ONETOUCH VERIO TEST STRIPS) Strip         Return in about 6 months (around 10/06/2024).      Earleen Glazier, MD    This note was partially created using M*Modal fluency direct system (voice recognition software ) and is inherently subject to errors including those of syntax and "sound- alike" substitutions which may escape proofreading.  In such instances, original meaning may be extrapolated by contextual derivation.

## 2024-04-05 NOTE — Assessment & Plan Note (Signed)
 Follows with Urology regularly

## 2024-04-05 NOTE — Assessment & Plan Note (Signed)
 Minimal social tobacco history.  Colonoscopy up-to-date.  PSA due November of 2028 but sees Urology.  Long discussion about immunization Tdap given today encouraged to get Shingrix at the pharmacy.  Patient exercises regularly and watches a healthy diet encouraged strength training.  Patient does not live in a house with a basement no risk of Radon

## 2024-04-05 NOTE — Assessment & Plan Note (Signed)
 Follows with Rheumatology regularly has been stable on adalimumab -adaz and is currently symptom-free

## 2024-04-05 NOTE — Assessment & Plan Note (Signed)
 Blood pressure well controlled on metoprolol  and enalapril .  Was recently decreased on doses blood pressure still well controlled but no longer lightheaded

## 2024-04-05 NOTE — Assessment & Plan Note (Signed)
Will check CMP and lipids on rosuvastatin medication refilled

## 2024-04-07 ENCOUNTER — Other Ambulatory Visit (HOSPITAL_COMMUNITY): Payer: Self-pay

## 2024-04-13 ENCOUNTER — Encounter (HOSPITAL_COMMUNITY): Payer: Self-pay | Admitting: Adult Medicine

## 2024-04-14 ENCOUNTER — Encounter (INDEPENDENT_AMBULATORY_CARE_PROVIDER_SITE_OTHER): Payer: Self-pay | Admitting: Family Medicine

## 2024-04-15 ENCOUNTER — Encounter: Payer: Self-pay | Admitting: *Deleted

## 2024-04-15 ENCOUNTER — Other Ambulatory Visit: Payer: Self-pay

## 2024-04-15 ENCOUNTER — Ambulatory Visit: Admission: EM | Admit: 2024-04-15 | Discharge: 2024-04-15 | Disposition: A | Discharge status: Home / Self Care

## 2024-04-15 DIAGNOSIS — S39012A Strain of muscle, fascia and tendon of lower back, initial encounter: Secondary | ICD-10-CM

## 2024-04-15 MED ORDER — CYCLOBENZAPRINE HCL 10 MG PO TABS: 10.0000 mg | ORAL_TABLET | Freq: Two times a day (BID) | ORAL | 0 refills | Status: AC | PRN

## 2024-04-15 NOTE — ED Triage Notes (Signed)
 On Thursday pt states he was working on an appliance and had to stretch awkwardly to reach something. Needed help getting up. Has been using heat and ibuprofen since then. Still has pain in certain positions, "I felt it when I was on my right side"

## 2024-04-15 NOTE — Discharge Instructions (Addendum)
 Symptoms and mechanism of action are most consistent with a right lumbar muscle strain. Reassuringly, there are no concerning neurological symptoms. We will treat with the following:  Flexeril  10 mg every 8 hours as needed for muscle spasms.  Use caution as this medication can cause drowsiness.  May continue to use ibuprofen for pain. Light stretching to help reduce stiffness May use heating pad or ice. Can Ice the area 2-3 times daily for 10-15 minutes to help with pain and swelling. Do not apply ice directly to the skin.   Return to urgent care or PCP or orthopedics if symptoms worsen or fail to resolve.

## 2024-04-15 NOTE — ED Provider Notes (Signed)
 [facility]    CSN: [id number] Arrival date & time: 04/15/24  1436      History   Chief Complaint Chief Complaint  Patient presents with   Back Pain    HPI Kyle Hughes is a 58 y.o. male.   58 year old male who presents urgent care with complaints of right back pain.  He reports that on Thursday he was working on an appliance and had to reach across to get something.  He stretched his back causing pain which made him fall completely onto his side.  Initially he was very sore but did not have any severe pain he woke up today with severe pain in the back.  The pain is isolated to the right side along the mid back.  He is not having radiating pain.  He has had muscle strains before.  He was concerned with his level of pain because his job requires him to lift over 50 pounds and he is unsure that he will be able to do this.  He denies any bowel or bladder incontinence.  He denies any urinary symptoms.  He is not having fevers or chills.   Back Pain Associated symptoms: no abdominal pain, no chest pain, no dysuria and no fever     Past Medical History:  Diagnosis Date   Diabetes mellitus without complication (HCC)    Hypertension     Patient Active Problem List   Diagnosis Date Noted   Hypertensive disorder 11/26/2023    History reviewed. No pertinent surgical history.     Home Medications    Prior to Admission medications   Medication Sig Start Date End Date Taking? Authorizing Provider  AMLODIPINE BESYLATE PO Take 1 tablet by mouth daily.   Yes [provider]  ATORVASTATIN CALCIUM PO Take by mouth.   Yes [provider]  Empagliflozin (JARDIANCE PO) Take by mouth.   Yes [provider]  METFORMIN HCL PO Take 1 tablet by mouth daily.   Yes [provider]  METOPROLOL SUCCINATE PO Take by mouth.   Yes [provider]  OZEMPIC, 2 MG/DOSE, 8 MG/3ML SOPN Inject 2 mg into the skin once a week. 03/28/24  Yes  [provider]  triamterene-hydrochlorothiazide (MAXZIDE-25) 37.5-25 MG tablet Take 1 tablet by mouth every morning.   Yes [provider]  ibuprofen (ADVIL) 400 MG tablet Take 400 mg by mouth every 6 (six) hours as needed.    [provider]  LOSARTAN POTASSIUM PO Take 1 tablet by mouth daily.    [provider]  methocarbamol  (ROBAXIN ) 500 MG tablet Take 1 tablet (500 mg total) by mouth 2 (two) times daily. 11/26/23   Corine Dice, MD  tamsulosin (FLOMAX) 0.4 MG CAPS capsule Take 0.4 mg by mouth daily.    [provider]    Family History Family History  Family history unknown: Yes    Social History Social History   Tobacco Use   Smoking status: Former   Smokeless tobacco: Never  Substance Use Topics   Alcohol use: Not Currently   Drug use: Never     Allergies   Patient has no known allergies.   Review of Systems Review of Systems  Constitutional:  Negative for chills and fever.  HENT:  Negative for ear pain and sore throat.   Eyes:  Negative for pain and visual disturbance.  Respiratory:  Negative for cough and shortness of breath.   Cardiovascular:  Negative for chest pain and palpitations.  Gastrointestinal:  Negative for abdominal pain and vomiting.  Genitourinary:  Negative for dysuria and hematuria.  Musculoskeletal:  Positive for back pain. Negative for arthralgias.  Skin:  Negative for color change and rash.  Neurological:  Negative for seizures and syncope.  All other systems reviewed and are negative.    Physical Exam Triage Vital Signs ED Triage Vitals  Encounter Vitals Group     BP 04/15/24 1511 (!) 132/90     Systolic BP Percentile --      Diastolic BP Percentile --      Pulse Rate 04/15/24 1511 80     Resp 04/15/24 1511 18     Temp 04/15/24 1511 98.5 F (36.9 C)     Temp Source 04/15/24 1511 Oral     SpO2 04/15/24 1511 97 %     Weight --      Height --      Head Circumference --      Peak  Flow --      Pain Score 04/15/24 1508 6     Pain Loc --      Pain Education --      Exclude from Growth Chart --    No data found.  Updated Vital Signs BP (!) 132/90 (BP Location: Right Arm)   Pulse 80   Temp 98.5 F (36.9 C) (Oral)   Resp 18   SpO2 97%   Visual Acuity Right Eye Distance:   Left Eye Distance:   Bilateral Distance:    Right Eye Near:   Left Eye Near:    Bilateral Near:     Physical Exam Vitals and nursing note reviewed.  Constitutional:      General: He is not in acute distress.    Appearance: He is well-developed.  HENT:     Head: Normocephalic and atraumatic.  Eyes:     Conjunctiva/sclera: Conjunctivae normal.  Cardiovascular:     Rate and Rhythm: Normal rate and regular rhythm.     Heart sounds: No murmur heard. Pulmonary:     Effort: Pulmonary effort is normal. No respiratory distress.     Breath sounds: Normal breath sounds.  Abdominal:     Palpations: Abdomen is soft.     Tenderness: There is no abdominal tenderness.  Musculoskeletal:        General: No swelling.     Cervical back: Neck supple.     Lumbar back: Spasms and tenderness present. No deformity or bony tenderness. Decreased range of motion. Negative right straight leg raise test and negative left straight leg raise test.       Back:  Skin:    General: Skin is warm and dry.     Capillary Refill: Capillary refill takes less than 2 seconds.  Neurological:     Mental Status: He is alert.  Psychiatric:        Mood and Affect: Mood normal.     UC Treatments / Results  Labs (all labs ordered are listed, but only abnormal results are displayed) Labs Reviewed - No data to display  EKG   Radiology No results found.  Procedures Procedures (including critical care time)  Medications Ordered in UC Medications - No data to display  Initial Impression / Assessment and Plan / UC Course  I have reviewed the triage vital signs and the nursing notes.  Pertinent labs & imaging  results that were available during my care of the patient were reviewed by me and considered in my medical decision making (  see chart for details).     Strain of lumbar paraspinal muscle, initial encounter      Symptoms and mechanism of action are most consistent with a right lumbar muscle strain. Reassuringly, there are no concerning neurological symptoms. We will treat with the following:  Flexeril  10 mg every 8 hours as needed for muscle spasms.  Use caution as this medication can cause drowsiness.  May continue to use ibuprofen for pain. Light stretching to help reduce stiffness May use heating pad or ice. Can Ice the area 2-3 times daily for 10-15 minutes to help with pain and swelling. Do not apply ice directly to the skin.   Return to urgent care or PCP or orthopedics if symptoms worsen or fail to resolve.    Final Clinical Impressions(s) / UC Diagnoses   Final diagnoses:  Strain of lumbar paraspinal muscle, initial encounter   Discharge Instructions       Robaxin  500 mg every 12 hours as needed for muscle spasms.  Use caution as this medication can cause drowsiness. Light stretching to help reduce stiffness May use heating pad or ice. Can Ice the area 2-3 times daily for 10-15 minutes to help with pain and swelling. Do not apply ice directly to the skin.   Return to urgent care or PCP if symptoms worsen or fail to resolve.    ED Prescriptions   None    PDMP not reviewed this encounter.   Kreg Pesa, [place] 04/15/24 1537

## 2024-04-18 DIAGNOSIS — M546 Pain in thoracic spine: Secondary | ICD-10-CM | POA: Diagnosis not present

## 2024-04-23 ENCOUNTER — Other Ambulatory Visit: Payer: Self-pay

## 2024-04-23 ENCOUNTER — Ambulatory Visit: Attending: Adult Medicine

## 2024-04-23 DIAGNOSIS — E1100 Type 2 diabetes mellitus with hyperosmolarity without nonketotic hyperglycemic-hyperosmolar coma (NKHHC): Secondary | ICD-10-CM | POA: Insufficient documentation

## 2024-04-23 DIAGNOSIS — E7800 Pure hypercholesterolemia, unspecified: Secondary | ICD-10-CM | POA: Insufficient documentation

## 2024-04-23 DIAGNOSIS — I10 Essential (primary) hypertension: Secondary | ICD-10-CM | POA: Insufficient documentation

## 2024-04-23 LAB — COMPREHENSIVE METABOLIC PNL, FASTING
ALBUMIN: 4.2 g/dL (ref 3.5–5.0)
ALKALINE PHOSPHATASE: 44 U/L — ABNORMAL LOW (ref 45–115)
ALT (SGPT): 38 U/L (ref <43)
ANION GAP: 10 mmol/L (ref 4–13)
AST (SGOT): 30 U/L (ref 11–34)
BILIRUBIN TOTAL: 0.9 mg/dL (ref 0.3–1.3)
BUN/CREA RATIO: 14 (ref 6–22)
BUN: 15 mg/dL (ref 8–25)
CALCIUM: 9.6 mg/dL (ref 8.6–10.2)
CHLORIDE: 105 mmol/L (ref 96–111)
CO2 TOTAL: 25 mmol/L (ref 22–30)
CREATININE: 1.06 mg/dL (ref 0.75–1.35)
ESTIMATED GFR - MALE: 81 mL/min/BSA (ref >=60)
GLUCOSE: 119 mg/dL — ABNORMAL HIGH (ref 70–99)
POTASSIUM: 4.5 mmol/L (ref 3.5–5.1)
PROTEIN TOTAL: 7.6 g/dL (ref 6.4–8.3)
SODIUM: 140 mmol/L (ref 136–145)

## 2024-04-23 LAB — CBC WITH DIFF
BASOPHIL #: 0.1 10*3/uL (ref 0.00–0.30)
BASOPHIL %: 1 % (ref 0–3)
EOSINOPHIL #: 0.1 10*3/uL (ref 0.00–0.50)
EOSINOPHIL %: 3 % (ref 0–7)
HCT: 48.4 % (ref 43.5–53.7)
HGB & HCT check: 0.5
HGB: 16.3 g/dL (ref 14.1–18.1)
LYMPHOCYTE #: 2.2 10*3/uL (ref 0.90–4.80)
LYMPHOCYTE %: 40 % (ref 10–50)
MCH: 30.3 pg (ref 27.0–31.2)
MCHC: 33.8 g/dL (ref 31.8–35.4)
MCV: 89.5 fL (ref 80.0–97.0)
MONOCYTE #: 0.5 10*3/uL (ref 0.30–0.90)
MONOCYTE %: 10 % (ref 0–12)
NEUTROPHIL #: 2.6 10*3/uL (ref 1.70–7.00)
NEUTROPHIL %: 47 % (ref 37–80)
PLATELETS: 200 10*3/uL (ref 142–424)
RBC: 5.4 10*6/uL (ref 4.69–6.13)
RDW: 13.6 % (ref 11.6–14.9)
WBC: 5.5 10*3/uL (ref 4.6–10.2)

## 2024-04-23 LAB — LIPID PANEL
CHOL/HDL RATIO: 2
CHOLESTEROL: 98 mg/dL — ABNORMAL LOW (ref 100–200)
HDL CHOL: 50 mg/dL (ref >=50)
LDL CALC: 33 mg/dL (ref <100)
NON-HDL: 48 mg/dL (ref <=190)
TRIGLYCERIDES: 73 mg/dL (ref <150)
VLDL CALC: 10 mg/dL (ref <30)

## 2024-04-23 LAB — HGA1C (HEMOGLOBIN A1C WITH EST AVG GLUCOSE)
ESTIMATED AVERAGE GLUCOSE: 146 mg/dL
HEMOGLOBIN A1C: 6.7 % (ref <=7.0)

## 2024-04-24 ENCOUNTER — Telehealth (HOSPITAL_COMMUNITY): Payer: Self-pay | Admitting: Adult Medicine

## 2024-04-24 DIAGNOSIS — E1100 Type 2 diabetes mellitus with hyperosmolarity without nonketotic hyperglycemic-hyperosmolar coma (NKHHC): Secondary | ICD-10-CM

## 2024-04-24 DIAGNOSIS — I10 Essential (primary) hypertension: Secondary | ICD-10-CM

## 2024-04-24 DIAGNOSIS — E7800 Pure hypercholesterolemia, unspecified: Secondary | ICD-10-CM

## 2024-04-24 NOTE — Telephone Encounter (Signed)
 Please tell patient his labs look excellent.  He should keep all his medications the same.  I put in an lab order for him to get before his November appointment.  Those labs will be fasting he should get them a week prior to his next appointment

## 2024-04-24 NOTE — Telephone Encounter (Signed)
-  PT VMB IS FULL  -SENT TEXT  -SENT MYCHART

## 2024-04-25 DIAGNOSIS — S92012D Displaced fracture of body of left calcaneus, subsequent encounter for fracture with routine healing: Secondary | ICD-10-CM | POA: Diagnosis not present

## 2024-04-26 DIAGNOSIS — M546 Pain in thoracic spine: Secondary | ICD-10-CM | POA: Diagnosis not present

## 2024-04-26 DIAGNOSIS — M459 Ankylosing spondylitis of unspecified sites in spine: Secondary | ICD-10-CM | POA: Diagnosis not present

## 2024-04-27 DIAGNOSIS — S92012D Displaced fracture of body of left calcaneus, subsequent encounter for fracture with routine healing: Secondary | ICD-10-CM | POA: Diagnosis not present

## 2024-05-01 ENCOUNTER — Other Ambulatory Visit: Payer: Self-pay | Admitting: Family Medicine

## 2024-05-01 DIAGNOSIS — M546 Pain in thoracic spine: Secondary | ICD-10-CM

## 2024-05-02 DIAGNOSIS — S29012D Strain of muscle and tendon of back wall of thorax, subsequent encounter: Secondary | ICD-10-CM | POA: Diagnosis not present

## 2024-05-04 DIAGNOSIS — S29012D Strain of muscle and tendon of back wall of thorax, subsequent encounter: Secondary | ICD-10-CM | POA: Diagnosis not present

## 2024-05-05 ENCOUNTER — Ambulatory Visit
Admission: RE | Admit: 2024-05-05 | Discharge: 2024-05-05 | Disposition: A | Discharge status: Home / Self Care | Source: Ambulatory Visit | Attending: Family Medicine | Admitting: Family Medicine

## 2024-05-05 DIAGNOSIS — M5134 Other intervertebral disc degeneration, thoracic region: Secondary | ICD-10-CM | POA: Diagnosis not present

## 2024-05-05 DIAGNOSIS — M546 Pain in thoracic spine: Secondary | ICD-10-CM

## 2024-05-09 DIAGNOSIS — M546 Pain in thoracic spine: Secondary | ICD-10-CM | POA: Diagnosis not present

## 2024-05-09 DIAGNOSIS — S29012D Strain of muscle and tendon of back wall of thorax, subsequent encounter: Secondary | ICD-10-CM | POA: Diagnosis not present

## 2024-05-10 ENCOUNTER — Encounter (HOSPITAL_COMMUNITY): Payer: Self-pay

## 2024-05-12 DIAGNOSIS — E1122 Type 2 diabetes mellitus with diabetic chronic kidney disease: Secondary | ICD-10-CM | POA: Diagnosis not present

## 2024-05-12 DIAGNOSIS — J90 Pleural effusion, not elsewhere classified: Secondary | ICD-10-CM | POA: Diagnosis not present

## 2024-05-12 DIAGNOSIS — M546 Pain in thoracic spine: Secondary | ICD-10-CM | POA: Diagnosis not present

## 2024-05-15 ENCOUNTER — Other Ambulatory Visit: Payer: Self-pay | Admitting: Family Medicine

## 2024-05-15 DIAGNOSIS — J90 Pleural effusion, not elsewhere classified: Secondary | ICD-10-CM

## 2024-05-16 DIAGNOSIS — S92012D Displaced fracture of body of left calcaneus, subsequent encounter for fracture with routine healing: Secondary | ICD-10-CM | POA: Diagnosis not present

## 2024-05-18 ENCOUNTER — Ambulatory Visit
Admission: RE | Admit: 2024-05-18 | Discharge: 2024-05-18 | Disposition: A | Discharge status: Home / Self Care | Source: Ambulatory Visit | Attending: Family Medicine | Admitting: Family Medicine

## 2024-05-18 ENCOUNTER — Other Ambulatory Visit

## 2024-05-18 DIAGNOSIS — J90 Pleural effusion, not elsewhere classified: Secondary | ICD-10-CM

## 2024-05-25 DIAGNOSIS — E1169 Type 2 diabetes mellitus with other specified complication: Secondary | ICD-10-CM | POA: Diagnosis not present

## 2024-05-25 DIAGNOSIS — M459 Ankylosing spondylitis of unspecified sites in spine: Secondary | ICD-10-CM | POA: Diagnosis not present

## 2024-05-25 DIAGNOSIS — E785 Hyperlipidemia, unspecified: Secondary | ICD-10-CM | POA: Diagnosis not present

## 2024-05-25 DIAGNOSIS — M461 Sacroiliitis, not elsewhere classified: Secondary | ICD-10-CM | POA: Diagnosis not present

## 2024-05-29 DIAGNOSIS — Z8052 Family history of malignant neoplasm of bladder: Secondary | ICD-10-CM | POA: Diagnosis not present

## 2024-05-29 DIAGNOSIS — Z0000 Encounter for general adult medical examination without abnormal findings: Secondary | ICD-10-CM | POA: Diagnosis not present

## 2024-05-29 DIAGNOSIS — I10 Essential (primary) hypertension: Secondary | ICD-10-CM | POA: Diagnosis not present

## 2024-05-29 DIAGNOSIS — E1169 Type 2 diabetes mellitus with other specified complication: Secondary | ICD-10-CM | POA: Diagnosis not present

## 2024-05-29 DIAGNOSIS — Z125 Encounter for screening for malignant neoplasm of prostate: Secondary | ICD-10-CM | POA: Diagnosis not present

## 2024-05-29 DIAGNOSIS — N401 Enlarged prostate with lower urinary tract symptoms: Secondary | ICD-10-CM | POA: Diagnosis not present

## 2024-05-29 DIAGNOSIS — E7800 Pure hypercholesterolemia, unspecified: Secondary | ICD-10-CM | POA: Diagnosis not present

## 2024-06-10 ENCOUNTER — Encounter (HOSPITAL_COMMUNITY): Payer: Self-pay | Admitting: Adult Medicine

## 2024-06-10 DIAGNOSIS — E1100 Type 2 diabetes mellitus with hyperosmolarity without nonketotic hyperglycemic-hyperosmolar coma (NKHHC): Secondary | ICD-10-CM

## 2024-06-12 ENCOUNTER — Telehealth (HOSPITAL_COMMUNITY): Payer: Self-pay | Admitting: Adult Medicine

## 2024-06-12 MED ORDER — MOUNJARO 10 MG/0.5 ML SUBCUTANEOUS PEN INJECTOR
10.0000 mg | PEN_INJECTOR | SUBCUTANEOUS | 4 refills | Status: DC
Start: 2024-06-12 — End: 2024-10-06

## 2024-06-12 NOTE — Telephone Encounter (Signed)
 I sent in the 10 mg Alm.  Do not forget to get your fasting blood work before you see me in November.

## 2024-06-20 DIAGNOSIS — M25561 Pain in right knee: Secondary | ICD-10-CM | POA: Diagnosis not present

## 2024-06-22 DIAGNOSIS — M459 Ankylosing spondylitis of unspecified sites in spine: Secondary | ICD-10-CM | POA: Diagnosis not present

## 2024-07-07 DIAGNOSIS — M1711 Unilateral primary osteoarthritis, right knee: Secondary | ICD-10-CM | POA: Diagnosis not present

## 2024-07-14 DIAGNOSIS — M1711 Unilateral primary osteoarthritis, right knee: Secondary | ICD-10-CM | POA: Diagnosis not present

## 2024-07-20 DIAGNOSIS — M459 Ankylosing spondylitis of unspecified sites in spine: Secondary | ICD-10-CM | POA: Diagnosis not present

## 2024-07-21 DIAGNOSIS — M1711 Unilateral primary osteoarthritis, right knee: Secondary | ICD-10-CM | POA: Diagnosis not present

## 2024-07-25 DIAGNOSIS — M940 Chondrocostal junction syndrome [Tietze]: Secondary | ICD-10-CM | POA: Diagnosis not present

## 2024-08-07 ENCOUNTER — Emergency Department (HOSPITAL_COMMUNITY)

## 2024-08-07 ENCOUNTER — Emergency Department
Admission: EM | Admit: 2024-08-07 | Discharge: 2024-08-07 | Disposition: A | Discharge status: Home / Self Care | Attending: Emergency Medicine | Admitting: Emergency Medicine

## 2024-08-07 ENCOUNTER — Encounter (HOSPITAL_COMMUNITY): Payer: Self-pay

## 2024-08-07 ENCOUNTER — Other Ambulatory Visit: Payer: Self-pay

## 2024-08-07 DIAGNOSIS — W3189XA Contact with other specified machinery, initial encounter: Secondary | ICD-10-CM

## 2024-08-07 DIAGNOSIS — Y990 Civilian activity done for income or pay: Secondary | ICD-10-CM

## 2024-08-07 DIAGNOSIS — S61211A Laceration without foreign body of left index finger without damage to nail, initial encounter: Secondary | ICD-10-CM

## 2024-08-07 DIAGNOSIS — S61231A Puncture wound without foreign body of left index finger without damage to nail, initial encounter: Secondary | ICD-10-CM | POA: Insufficient documentation

## 2024-08-07 DIAGNOSIS — W458XXA Other foreign body or object entering through skin, initial encounter: Secondary | ICD-10-CM | POA: Insufficient documentation

## 2024-08-07 DIAGNOSIS — M7989 Other specified soft tissue disorders: Secondary | ICD-10-CM

## 2024-08-07 DIAGNOSIS — S62641A Nondisplaced fracture of proximal phalanx of left index finger, initial encounter for closed fracture: Secondary | ICD-10-CM

## 2024-08-07 DIAGNOSIS — S61219A Laceration without foreign body of unspecified finger without damage to nail, initial encounter: Secondary | ICD-10-CM

## 2024-08-07 DIAGNOSIS — S62609B Fracture of unspecified phalanx of unspecified finger, initial encounter for open fracture: Secondary | ICD-10-CM

## 2024-08-07 DIAGNOSIS — S62621A Displaced fracture of medial phalanx of left index finger, initial encounter for closed fracture: Secondary | ICD-10-CM

## 2024-08-07 HISTORY — DX: Displaced fracture of middle phalanx of left index finger, initial encounter for closed fracture: S62.621A

## 2024-08-07 MED ORDER — CEPHALEXIN 500 MG CAPSULE
500.0000 mg | ORAL_CAPSULE | Freq: Four times a day (QID) | ORAL | 0 refills | Status: AC
Start: 2024-08-07 — End: 2024-08-14

## 2024-08-07 MED ORDER — CEPHALEXIN 250 MG CAPSULE
500.0000 mg | ORAL_CAPSULE | ORAL | Status: AC
Start: 2024-08-07 — End: 2024-08-07
  Administered 2024-08-07: 500 mg via ORAL
  Filled 2024-08-07: qty 2

## 2024-08-07 MED ORDER — LIDOCAINE HCL 10 MG/ML (1 %) INJECTION SOLUTION
2.0000 mL | INTRAMUSCULAR | Status: AC
Start: 2024-08-07 — End: 2024-08-07
  Administered 2024-08-07: 20 mg via INTRADERMAL
  Filled 2024-08-07: qty 10

## 2024-08-07 NOTE — ED Nurses Note (Signed)
 Patient discharged home with family.  AVS reviewed with patient/care giver.  A written copy of the AVS and discharge instructions was given to the patient/care giver.  Questions sufficiently answered as needed.  Patient/care giver encouraged to follow up with PCP as indicated.  In the event of an emergency, patient/care giver instructed to call 911 or go to the nearest emergency room. Jaylia Pettus, RN

## 2024-08-07 NOTE — ED Triage Notes (Signed)
 Pt presents to the ed ambulatory with c/o finger injury and laceration to left 2nd digit. Pt reports he was at work and a sprocket and a gear smashed his finger, states gear weighed approx 40 lbs. Tetanus UTD.

## 2024-08-07 NOTE — ED Nurses Note (Signed)
 Metal finger splint applied to left 2nd digit per providers order.

## 2024-08-07 NOTE — Procedures (Signed)
 Lac Repair: L index finger    Date/Time: 08/07/2024 4:55 PM    Performed by: Barbaraann Bruckner, DO  Authorized by: Barbaraann Bruckner, DO    Consent:     Consent obtained:  Verbal    Consent given by:  Patient    Risks discussed:  Infection, pain, poor cosmetic result and poor wound healing  Universal protocol:     Procedure explained and questions answered to patient or proxy's satisfaction: yes      Imaging studies available: yes      Patient identity confirmed:  Verbally with patient and arm band  Anesthesia:     Anesthesia method:  Local infiltration    Local anesthetic:  Lidocaine  1% w/o epi  Laceration details:     Location:  Finger    Finger location:  L index finger    Wound length (cm): Patient had a 1 cm laceration on the volar aspect of the base of the 2nd finger and a 0.5 cm laceration at the base of the dorsal aspect of the 2nd/index finger.  Pre-procedure details:     Preparation:  Patient was prepped and draped in usual sterile fashion and imaging obtained to evaluate for foreign bodies  Exploration:     Wound extent: underlying fracture    Treatment:     Area cleansed with:  Povidone-iodine and saline    Amount of cleaning:  Standard    Irrigation solution:  Sterile saline    Visualized foreign bodies/material removed: no    Skin repair:     Repair method:  Sutures    Suture size:  5-0    Suture material:  Prolene    Suture technique:  Simple interrupted    Number of sutures: Two sutures on the volar aspect of the finger and 1 suture on the dorsal aspect of the finger.  Approximation:     Approximation:  Close  Repair type:     Repair type:  Simple  Post-procedure details:     Dressing:  Non-adherent dressing and splint for protection    Procedure completion:  Tolerated well, no immediate complications      Bruckner Barbaraann, DO   9/22/202516:55

## 2024-08-07 NOTE — ED Provider Notes (Signed)
 [facility] - Emergency Department  ED Primary Note  History of Present Illness  Chase [name] is a 58 year old male who presents with a puncture wound and laceration to the left index finger. He is accompanied by his significant other.    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 The wound penetrated through the finger. A safety officer documented the injury with a photograph of his gloves. He experiences no significant pain and has declined pain medication. He received a tetanus shot approximately one and a half to three months ago. He has not applied surgical glue or taken antibiotics. The injury is work-related, and he has completed worker's compensation forms. He maintains appropriate range of motion in the affected finger.  Physical Exam   ED Triage Vitals [08/07/24 1410]   BP (Non-Invasive) 115/77   Heart Rate 73   Respiratory Rate 17   Temperature 36 C (96.8 F)   SpO2 98 %   Weight 80.3 kg (177 lb)   Height 1.753 m (5' 9)     Physical Exam  GENERAL: Patient is alert and oriented x4. No acute distress.  HEENT: Head is atraumatic, normocephalic. Eyes demonstrate normal conjunctiva. Oropharynx is moist. Posterior oropharynx without erythema, edema, or exudate. Neck is supple. No lymphadenopathy.  HEART: Regular rate and rhythm. No murmurs, gallops, or rubs.  RESPIRATORY: Lungs are clear to auscultation bilaterally. No rhonchi, wheezes, rales.  ABDOMEN: Soft, Nontender. Positive bowel sounds in all 4 quadrants.  EXTREMITIES: Extremities without clubbing or cyanosis. Mild edema at the base of the left index finger. Muscle strength and sensation remain intact throughout. Puncture wounds/lacerations at the base of the left index finger, volar aspect, possibly through to dorsal aspect of the second finger. 1 cm laceration on volar aspect, 0.5 cm laceration on distal aspect of left index finger, no  significant width. Bleeding well controlled with bandage. Appropriate range of motion. No evidence of flexor tenosynovitis. Nondisplaced proximal phalanx fracture of left second finger.  NEURO: Alert and oriented x4. No focal muscle or sensory deficits. GCS of 15.  Patient Data   Results  RADIOLOGY  Left hand X-ray: Nondisplaced proximal phalanx fracture of the second finger (08/07/2024)    PROCEDURE  Suture placement  Description: Two sutures placed on the volar aspect of the finger and one suture on the dorsal aspect at the base of the finger. Wound cleansed and managed post-suturing. Finger splint applied for immobilization.  Labs Ordered/Reviewed - No data to display  XR HAND LEFT   Final Result by Edi, Radresults In (09/22 1529)   Nondisplaced proximal second phalangeal fracture with soft tissue swelling.                      Radiologist location ID: [id number]           Medical Decision Making          Medical Decision Making  A 58 year old male presented after sustaining a work-related puncture and laceration injury to the base of his left index finger from a sharp, pointed gear, with the wound potentially traversing through to the dorsal aspect. Exam revealed a 1 cm volar and 0.5 cm dorsal laceration, mild edema, well-controlled bleeding, preserved range of motion, and no signs of flexor tenosynovitis. X-ray demonstrated a nondisplaced proximal phalanx fracture of the left index finger. The patient had a recent tetanus immunization and no other  injuries were noted.    Open nondisplaced fracture and laceration of left index finger  - Placed two sutures on the volar aspect and one suture on the dorsal aspect of the finger  - Administered Keflex  for antibiotic prophylaxis due to open fracture  - Placed finger splint to immobilize the finger  - Referred to [facility] for follow-up  - Instructed on suture removal in 7-10 days  - Completed worker's compensation forms  - Advised to return if further  issues arise or to call tomorrow to schedule follow-up appointment  Medical Decision Making  Amount and/or Complexity of Data Reviewed  Radiology: ordered.    Risk  Prescription drug management.                Medications Ordered/Administered in the ED   cephalexin  (KEFLEX ) capsule (500 mg Oral Given 08/07/24 1502)   lidocaine  1% injection (20 mg Intradermal Given 08/07/24 1503)     Clinical Impression   Finger laceration (Primary)   Open finger fracture       Disposition: Discharged

## 2024-08-07 NOTE — Discharge Instructions (Addendum)
 Your sutures will need to be removed in 7-10 days.  This may be done by the hand specialist when you follow up, or your family provider, [facility], or us  here in the emergency department.  Return to the ER if any further issues should arise.  Take the full course of antibiotics to help prevent infection.

## 2024-08-15 ENCOUNTER — Other Ambulatory Visit (INDEPENDENT_AMBULATORY_CARE_PROVIDER_SITE_OTHER): Payer: Self-pay | Admitting: NURSE PRACTITIONER

## 2024-08-16 NOTE — Telephone Encounter (Signed)
 Last phone note, patient indicated he was stopping due to side effects and resuming tadalafil  therapy daily.

## 2024-08-17 ENCOUNTER — Encounter (INDEPENDENT_AMBULATORY_CARE_PROVIDER_SITE_OTHER): Payer: Self-pay | Admitting: NURSE PRACTITIONER

## 2024-08-17 DIAGNOSIS — R399 Unspecified symptoms and signs involving the genitourinary system: Secondary | ICD-10-CM | POA: Diagnosis not present

## 2024-08-17 DIAGNOSIS — M546 Pain in thoracic spine: Secondary | ICD-10-CM | POA: Diagnosis not present

## 2024-08-17 DIAGNOSIS — N400 Enlarged prostate without lower urinary tract symptoms: Secondary | ICD-10-CM | POA: Diagnosis not present

## 2024-08-17 DIAGNOSIS — R3916 Straining to void: Secondary | ICD-10-CM | POA: Diagnosis not present

## 2024-08-17 DIAGNOSIS — R350 Frequency of micturition: Secondary | ICD-10-CM | POA: Diagnosis not present

## 2024-08-17 DIAGNOSIS — R351 Nocturia: Secondary | ICD-10-CM | POA: Diagnosis not present

## 2024-08-17 DIAGNOSIS — R3121 Asymptomatic microscopic hematuria: Secondary | ICD-10-CM | POA: Diagnosis not present

## 2024-08-17 DIAGNOSIS — M459 Ankylosing spondylitis of unspecified sites in spine: Secondary | ICD-10-CM | POA: Diagnosis not present

## 2024-08-17 MED ORDER — TAMSULOSIN 0.4 MG CAPSULE
0.4000 mg | ORAL_CAPSULE | Freq: Every evening | ORAL | 1 refills | Status: DC
Start: 2024-08-17 — End: 2024-08-29

## 2024-08-17 NOTE — Telephone Encounter (Signed)
 Medication refill request.

## 2024-08-20 DIAGNOSIS — M545 Low back pain, unspecified: Secondary | ICD-10-CM | POA: Diagnosis not present

## 2024-08-20 DIAGNOSIS — M549 Dorsalgia, unspecified: Secondary | ICD-10-CM | POA: Diagnosis not present

## 2024-08-22 ENCOUNTER — Ambulatory Visit (INDEPENDENT_AMBULATORY_CARE_PROVIDER_SITE_OTHER): Payer: Self-pay | Admitting: Otolaryngology

## 2024-08-23 ENCOUNTER — Other Ambulatory Visit: Payer: Self-pay | Admitting: Urology

## 2024-08-23 DIAGNOSIS — R262 Difficulty in walking, not elsewhere classified: Secondary | ICD-10-CM | POA: Diagnosis not present

## 2024-08-23 DIAGNOSIS — M545 Low back pain, unspecified: Secondary | ICD-10-CM | POA: Diagnosis not present

## 2024-08-23 DIAGNOSIS — R972 Elevated prostate specific antigen [PSA]: Secondary | ICD-10-CM

## 2024-08-23 DIAGNOSIS — S39012D Strain of muscle, fascia and tendon of lower back, subsequent encounter: Secondary | ICD-10-CM | POA: Diagnosis not present

## 2024-08-28 DIAGNOSIS — M545 Low back pain, unspecified: Secondary | ICD-10-CM | POA: Diagnosis not present

## 2024-08-28 DIAGNOSIS — S39012D Strain of muscle, fascia and tendon of lower back, subsequent encounter: Secondary | ICD-10-CM | POA: Diagnosis not present

## 2024-08-28 DIAGNOSIS — R262 Difficulty in walking, not elsewhere classified: Secondary | ICD-10-CM | POA: Diagnosis not present

## 2024-08-29 ENCOUNTER — Encounter (INDEPENDENT_AMBULATORY_CARE_PROVIDER_SITE_OTHER): Payer: Self-pay | Admitting: NURSE PRACTITIONER

## 2024-08-29 MED ORDER — ALFUZOSIN ER 10 MG TABLET,EXTENDED RELEASE 24 HR: 10.0000 mg | ORAL_TABLET | Freq: Every day | ORAL | 5 refills | Status: DC

## 2024-08-29 NOTE — Telephone Encounter (Signed)
 I am sending Alfuzosin. It is similar to tamsulosin , but long acting to see if it is not as significant with side effects. Stop tamsulosin , start Alfuzosin and notify office if still with symptoms. IF so, we can adjust a different drug class, but it takes 3/6 months to kick in. If any side effects, notify office.

## 2024-08-30 DIAGNOSIS — S39012D Strain of muscle, fascia and tendon of lower back, subsequent encounter: Secondary | ICD-10-CM | POA: Diagnosis not present

## 2024-08-30 DIAGNOSIS — R262 Difficulty in walking, not elsewhere classified: Secondary | ICD-10-CM | POA: Diagnosis not present

## 2024-08-30 DIAGNOSIS — M545 Low back pain, unspecified: Secondary | ICD-10-CM | POA: Diagnosis not present

## 2024-09-03 ENCOUNTER — Other Ambulatory Visit (HOSPITAL_COMMUNITY): Payer: Self-pay | Admitting: Adult Medicine

## 2024-09-03 DIAGNOSIS — E7800 Pure hypercholesterolemia, unspecified: Secondary | ICD-10-CM

## 2024-09-03 DIAGNOSIS — I10 Essential (primary) hypertension: Secondary | ICD-10-CM

## 2024-09-04 DIAGNOSIS — R262 Difficulty in walking, not elsewhere classified: Secondary | ICD-10-CM | POA: Diagnosis not present

## 2024-09-04 DIAGNOSIS — S39012D Strain of muscle, fascia and tendon of lower back, subsequent encounter: Secondary | ICD-10-CM | POA: Diagnosis not present

## 2024-09-04 DIAGNOSIS — M545 Low back pain, unspecified: Secondary | ICD-10-CM | POA: Diagnosis not present

## 2024-09-07 DIAGNOSIS — N200 Calculus of kidney: Secondary | ICD-10-CM | POA: Diagnosis not present

## 2024-09-07 DIAGNOSIS — R3121 Asymptomatic microscopic hematuria: Secondary | ICD-10-CM | POA: Diagnosis not present

## 2024-09-08 DIAGNOSIS — M47814 Spondylosis without myelopathy or radiculopathy, thoracic region: Secondary | ICD-10-CM | POA: Diagnosis not present

## 2024-09-11 DIAGNOSIS — M545 Low back pain, unspecified: Secondary | ICD-10-CM | POA: Diagnosis not present

## 2024-09-11 DIAGNOSIS — S39012D Strain of muscle, fascia and tendon of lower back, subsequent encounter: Secondary | ICD-10-CM | POA: Diagnosis not present

## 2024-09-11 DIAGNOSIS — R262 Difficulty in walking, not elsewhere classified: Secondary | ICD-10-CM | POA: Diagnosis not present

## 2024-09-14 DIAGNOSIS — M459 Ankylosing spondylitis of unspecified sites in spine: Secondary | ICD-10-CM | POA: Diagnosis not present

## 2024-09-22 ENCOUNTER — Other Ambulatory Visit (HOSPITAL_COMMUNITY): Payer: Self-pay | Admitting: Adult Medicine

## 2024-09-22 DIAGNOSIS — I10 Essential (primary) hypertension: Secondary | ICD-10-CM

## 2024-09-26 ENCOUNTER — Ambulatory Visit (HOSPITAL_COMMUNITY): Payer: Self-pay | Admitting: Adult Medicine

## 2024-10-02 ENCOUNTER — Ambulatory Visit
Admission: RE | Admit: 2024-10-02 | Discharge: 2024-10-02 | Disposition: A | Discharge status: Home / Self Care | Source: Ambulatory Visit | Attending: Urology | Admitting: Urology

## 2024-10-02 ENCOUNTER — Other Ambulatory Visit: Payer: Self-pay

## 2024-10-02 ENCOUNTER — Ambulatory Visit: Attending: Adult Medicine

## 2024-10-02 DIAGNOSIS — R972 Elevated prostate specific antigen [PSA]: Secondary | ICD-10-CM

## 2024-10-02 DIAGNOSIS — N400 Enlarged prostate without lower urinary tract symptoms: Secondary | ICD-10-CM | POA: Diagnosis not present

## 2024-10-02 DIAGNOSIS — E1100 Type 2 diabetes mellitus with hyperosmolarity without nonketotic hyperglycemic-hyperosmolar coma (NKHHC): Secondary | ICD-10-CM | POA: Insufficient documentation

## 2024-10-02 DIAGNOSIS — E7800 Pure hypercholesterolemia, unspecified: Secondary | ICD-10-CM | POA: Insufficient documentation

## 2024-10-02 DIAGNOSIS — I10 Essential (primary) hypertension: Secondary | ICD-10-CM | POA: Insufficient documentation

## 2024-10-02 LAB — COMPREHENSIVE METABOLIC PNL, FASTING
ALBUMIN: 4.1 g/dL (ref 3.5–5.0)
ALKALINE PHOSPHATASE: 44 U/L — ABNORMAL LOW (ref 45–115)
ALT (SGPT): 28 U/L (ref <43)
ANION GAP: 10 mmol/L (ref 4–13)
AST (SGOT): 22 U/L (ref 11–34)
BILIRUBIN TOTAL: 0.8 mg/dL (ref 0.3–1.3)
BUN/CREA RATIO: 19 (ref 6–22)
BUN: 18 mg/dL (ref 8–25)
CALCIUM: 9.6 mg/dL (ref 8.6–10.2)
CHLORIDE: 106 mmol/L (ref 96–111)
CO2 TOTAL: 23 mmol/L (ref 22–30)
CREATININE: 0.96 mg/dL (ref 0.75–1.35)
GLUCOSE: 141 mg/dL — ABNORMAL HIGH (ref 70–99)
POTASSIUM: 4.4 mmol/L (ref 3.5–5.1)
PROTEIN TOTAL: 6.8 g/dL (ref 6.4–8.3)
SODIUM: 139 mmol/L (ref 136–145)
eGFRcr - MALE: >90 mL/min/1.73mˆ2 (ref >=60)

## 2024-10-02 LAB — LIPID PANEL
CHOL/HDL RATIO: 2
CHOLESTEROL: 90 mg/dL — ABNORMAL LOW (ref 100–200)
HDL CHOL: 45 mg/dL — ABNORMAL LOW (ref >=50)
LDL CALC: 28 mg/dL (ref <100)
NON-HDL: 45 mg/dL (ref <=190)
TRIGLYCERIDES: 83 mg/dL (ref <150)
VLDL CALC: 11 mg/dL (ref <30)

## 2024-10-02 LAB — MICROALBUMIN/CREATININE RATIO, URINE, RANDOM
CREATININE RANDOM URINE: 128.53 mg/dL
MICROALBUMIN RANDOM URINE: 0.6 mg/dL
MICROALBUMIN/CREATININE RATIO RANDOM URINE: 4.7 mg/g (ref <30.0)

## 2024-10-02 LAB — HGA1C (HEMOGLOBIN A1C WITH EST AVG GLUCOSE)
ESTIMATED AVERAGE GLUCOSE: 137 mg/dL
HEMOGLOBIN A1C: 6.4 % (ref <=7.0)

## 2024-10-02 MED ORDER — GADOPICLENOL 0.5 MMOL/ML IV SOLN
10.0000 mL | Freq: Once | INTRAVENOUS | Status: AC | PRN
  Administered 2024-10-02: 10 mL via INTRAVENOUS

## 2024-10-05 ENCOUNTER — Encounter (HOSPITAL_COMMUNITY): Payer: Self-pay

## 2024-10-06 ENCOUNTER — Other Ambulatory Visit: Payer: Self-pay

## 2024-10-06 ENCOUNTER — Ambulatory Visit: Payer: Self-pay | Attending: Adult Medicine | Admitting: Adult Medicine

## 2024-10-06 ENCOUNTER — Encounter (HOSPITAL_COMMUNITY): Payer: Self-pay | Admitting: Adult Medicine

## 2024-10-06 VITALS — BP 118/75 | HR 82 | Temp 97.2°F | Resp 16 | Ht 69.0 in | Wt 181.0 lb

## 2024-10-06 DIAGNOSIS — E1100 Type 2 diabetes mellitus with hyperosmolarity without nonketotic hyperglycemic-hyperosmolar coma (NKHHC): Secondary | ICD-10-CM | POA: Insufficient documentation

## 2024-10-06 DIAGNOSIS — I10 Essential (primary) hypertension: Secondary | ICD-10-CM | POA: Insufficient documentation

## 2024-10-06 DIAGNOSIS — R350 Frequency of micturition: Secondary | ICD-10-CM | POA: Insufficient documentation

## 2024-10-06 DIAGNOSIS — L4050 Arthropathic psoriasis, unspecified: Secondary | ICD-10-CM | POA: Insufficient documentation

## 2024-10-06 DIAGNOSIS — I2510 Atherosclerotic heart disease of native coronary artery without angina pectoris: Secondary | ICD-10-CM | POA: Insufficient documentation

## 2024-10-06 DIAGNOSIS — Z7985 Long-term (current) use of injectable non-insulin antidiabetic drugs: Secondary | ICD-10-CM

## 2024-10-06 DIAGNOSIS — E7800 Pure hypercholesterolemia, unspecified: Secondary | ICD-10-CM | POA: Insufficient documentation

## 2024-10-06 MED ORDER — METOPROLOL SUCCINATE ER 25 MG TABLET,EXTENDED RELEASE 24 HR
25.0000 mg | ORAL_TABLET | Freq: Every day | ORAL | 1 refills | Status: AC
Start: 2024-10-06 — End: 2025-04-04

## 2024-10-06 MED ORDER — MOUNJARO 10 MG/0.5 ML SUBCUTANEOUS PEN INJECTOR: 10.0000 mg | PEN_INJECTOR | SUBCUTANEOUS | 1 refills | Status: DC

## 2024-10-06 MED ORDER — ENALAPRIL MALEATE 10 MG TABLET
10.0000 mg | ORAL_TABLET | Freq: Every day | ORAL | 1 refills | Status: AC
Start: 2024-10-06 — End: 2025-04-04

## 2024-10-06 MED ORDER — ROSUVASTATIN 20 MG TABLET
20.0000 mg | ORAL_TABLET | Freq: Every evening | ORAL | 1 refills | Status: AC
Start: 2024-10-06 — End: 2025-04-04

## 2024-10-06 NOTE — Progress Notes (Signed)
 FAMILY MEDICINE, [facility] [address]  [place] [address]  Operated by [facility]          NAME: Chase [name] DOS: 10/06/2024   MRN: [id number] DOB: [birth year]     Chief Complaint: Follow Up 36 Months (58 year old male presents with granddaughter Mela for 6 month follow up and offers no complaints, had labs 10-02-2024, colonoscopy UTD and due 2028, UTD with pneumo and Tdap, Declines flu and shingrix )      History of Present Illness: Chase [name] is a 58 y.o. male who comes in today for follow up or problem.      Medical History     I have reviewed and updated as appropriate the past medical, surgical, family, and social history today:  Medical History/Surgical History/Family History/Social History  Past Medical History:   Diagnosis Date    Arthritis with psoriasis (CMS HCC)     Arthropathy     psoriatic    CAD (coronary artery disease)     Diabetes mellitus, type 2 03/19/2009    Fracture of distal end of tibia     left    Fracture of distal fibula     left    Fracture of middle phalanx of left index finger 08/07/2024    [employer] workers compensation    GERD (gastroesophageal reflux disease)     Hyperplastic colon polyp 12/14/2015    Hypertension     Kidney stones     Right shoulder pain     Tubular adenoma of colon 12/14/2015    Type 2 diabetes mellitus     Urinary calculus, unspecified     Renal stones         Past Surgical History:   Procedure Laterality Date    COLONOSCOPY  12/14/2015    Dr Gilberto due 2020/tubular adenoma/ hyperplastic polyp    COLONOSCOPY  10/15/2022    [facility]/ Dr Anda  DUE 2028    COLONOSCOPY N/A 10/23/2016    Performed by Janan Kitchens, MD at [facility] OR ENDO    COLONOSCOPY DIAGNOSTIC N/A 10/15/2022    Performed by Rosalinda Guerry HERO, MD at [facility] OR ENDO    HX APPENDECTOMY      2017    HX CARPAL TUNNEL RELEASE  1990    bil    HX HEART CATHETERIZATION  09/2008    HX HERNIA REPAIR  1970    2010    LITHOTRIPSY  2004    PB LAP UMBILICAL HERNIA REPAIR  2009     Family Medical  History:       Problem Relation (Age of Onset)    Cancer Father    Diabetes Mother, Father    Hypertension (High Blood Pressure) Mother             Social History     Socioeconomic History    Marital status: Married    Number of children: 3   Tobacco Use    Smoking status: Former     Types: Cigars     Passive exposure: Past    Smokeless tobacco: Former     Types: Snuff     Quit date: 10/26/2019    Tobacco comments:     3-4 a week   Vaping Use    Vaping status: Never Used   Substance and Sexual Activity    Alcohol use: Yes     Comment: occassionally    Drug use: Never  Social Determinants of Health     Financial Resource Strain: Low Risk (10/06/2024)    Financial Resource Strain     SDOH Financial: No   Transportation Needs: Low Risk (10/06/2024)    Transportation Needs     SDOH Transportation: No   Social Connections: Low Risk (10/06/2024)    Social Connections     SDOH Social Isolation: 5 or more times a week   Intimate Partner Violence: Low Risk (10/06/2024)    Intimate Partner Violence     SDOH Domestic Violence: No   Housing Stability: Low Risk (10/06/2024)    Housing Stability     SDOH Housing Situation: I have housing.     SDOH Housing Worry: No       Allergies:  Allergies[1]    Medications:  Current Outpatient Medications   Medication Sig    adalimumab -adaz 40 mg/0.4 mL Subcutaneous Pen Injector Inject 0.4 mL (40 mg total) under the skin Every 14 days    alfuzosin  (UROXATRAL ) 10 mg Oral Tablet Sustained Release 24 hr Take 1 Tablet (10 mg total) by mouth Daily    Blood Sugar Diagnostic (ONETOUCH VERIO TEST STRIPS) Strip 1 Strip by In Vitro route Three times a day    Blood-Glucose Meter (ONETOUCH VERIO IQ METER) Misc 1 Each by In Vitro route Three times a day    enalapril  maleate (VASOTEC ) 10 mg Oral Tablet Take 1 Tablet (10 mg total) by mouth Daily    lancets (ONETOUCH SURESOFT LANCING DEV) 28 gauge Misc 1 Each by In Vitro route Three times a day    metoprolol  succinate (TOPROL -XL) 25 mg Oral Tablet  Sustained Release 24 hr Take 1 Tablet (25 mg total) by mouth Daily    ONETOUCH DELICA PLUS LANCET 30 gauge Does not apply Misc     rosuvastatin  (CRESTOR ) 20 mg Oral Tablet Take 1 Tablet (20 mg total) by mouth Every evening    tirzepatide  (MOUNJARO ) 10 mg/0.5 mL Subcutaneous Pen Injector Inject 0.5 mL (10 mg total) under the skin Every 7 days       Immunizations:  Immunization History   Administered Date(s) Administered    AFLURIA-Influenza Vaccine,3 Yr+ 11/15/2012, 08/23/2015    INFLUENZA VIRUS VACCINE (ADMIN) 10/31/2004, 11/05/2005, 10/03/2009    Influenza Vaccine, 6 month-adult 09/09/2010, 09/09/2011, 09/28/2014, 10/22/2017, 11/14/2018    Pneumovax 10/31/2004    Tetanus Toxoid/Diphtheria Toxoid/Acellular Pertussis Vaccine, Adsorbed 04/05/2024       Problem List:  Patient Active Problem List    Diagnosis Date Noted    Urinary frequency 10/06/2024    Well adult exam 04/05/2024    Tubular adenoma of colon 10/10/2019    Right shoulder pain 09/17/2017    History of kidney stones 04/19/2017    Screening PSA (prostate specific antigen) 02/16/2017    Screen for colon cancer 02/16/2017    Pure hypercholesterolemia 01/23/2010    Back pain 10/03/2009    Type 2 diabetes mellitus with hyperosmolarity without coma, without long-term current use of insulin (CMS HCC)     Essential hypertension     Gastroesophageal reflux disease without esophagitis     Urinary calculus, unspecified     Psoriatic arthritis (CMS HCC)     Fracture of distal end of tibia     Fracture of distal fibula     Coronary artery disease involving native coronary artery without angina pectoris           PHQ Questionnaire  Little interest or pleasure in doing things.: Not at all  Feeling down,  depressed, or hopeless: Not at all  PHQ 2 Total: 0    Objective   Vitals:  BP 118/75 (Site: Left Arm, Patient Position: Sitting, Cuff Size: Adult)   Pulse 82   Temp 36.2 C (97.2 F) (Thermal Scan)   Resp 16   Ht 1.753 m (5' 9)   Wt 82.1 kg (181 lb)   SpO2 98%    BMI 26.73 kg/m       Physical Exam:  Psych: Appropriate affect and mood.     General: In no acute distress, adequately groomed and dressed.  Respiratory:  Normal respiratory rate. CTA in all fields.  No use of accessory muscles.  Cardiovascular: RRR. No murmurs, rubs or gallops  Musculoskeletal: Gait unremarkable. Moving all 4 extremities. No observed joint swelling.  Extremities: No clubbing cyanosis or edema.     Assessment/Plan   Diagnosis:  Problem List Items Addressed This Visit          Cardiovascular System    Essential hypertension    Blood pressure well controlled on metoprolol  and enalapril .  Much less lightheadedness happens only when he has significant position change like sitting to standing and it is minimal         Relevant Medications    metoprolol  succinate (TOPROL -XL) 25 mg Oral Tablet Sustained Release 24 hr    enalapril  maleate (VASOTEC ) 10 mg Oral Tablet    Coronary artery disease involving native coronary artery without angina pectoris    Reviewed all labs with patient from October 02, 2024 his total cholesterol is 90 HDL 45, LDL 28 triglycerides 83 lipids ideal on rosuvastatin          Relevant Medications    rosuvastatin  (CRESTOR ) 20 mg Oral Tablet    metoprolol  succinate (TOPROL -XL) 25 mg Oral Tablet Sustained Release 24 hr    enalapril  maleate (VASOTEC ) 10 mg Oral Tablet    Pure hypercholesterolemia    As above reviewed labs from October 02, 2024 total cholesterol 90, HDL 45, LDL 28 and triglycerides 83 with an AST of 22 and an ALT of 28 stable on rosuvastatin          Relevant Medications    rosuvastatin  (CRESTOR ) 20 mg Oral Tablet    Other Relevant Orders    COMPREHENSIVE METABOLIC PNL, FASTING    HGA1C (HEMOGLOBIN A1C WITH EST AVG GLUCOSE)    CBC/DIFF       Endocrine    Type 2 diabetes mellitus with hyperosmolarity without coma, without long-term current use of insulin (CMS HCC)    A1c 6.4 on Mounjaro  10 mg a urine for microalbumin negative         Relevant Medications    tirzepatide   (MOUNJARO ) 10 mg/0.5 mL Subcutaneous Pen Injector    rosuvastatin  (CRESTOR ) 20 mg Oral Tablet    enalapril  maleate (VASOTEC ) 10 mg Oral Tablet    Other Relevant Orders    COMPREHENSIVE METABOLIC PNL, FASTING    HGA1C (HEMOGLOBIN A1C WITH EST AVG GLUCOSE)    CBC/DIFF       Musculoskeletal    Psoriatic arthritis (CMS HCC)    Stable with Rheumatology            Other    Urinary frequency - Primary    Following with urology and changed from Flomax  to uroxatrol and he is doing better.  Seeing them in January.           Other Visit Diagnoses         Hypertension  Relevant Medications    metoprolol  succinate (TOPROL -XL) 25 mg Oral Tablet Sustained Release 24 hr    enalapril  maleate (VASOTEC ) 10 mg Oral Tablet    Other Relevant Orders    COMPREHENSIVE METABOLIC PNL, FASTING    HGA1C (HEMOGLOBIN A1C WITH EST AVG GLUCOSE)    CBC/DIFF               Encounter Medications and Orders  Orders Placed This Encounter    COMPREHENSIVE METABOLIC PNL, FASTING    HGA1C (HEMOGLOBIN A1C WITH EST AVG GLUCOSE)    CBC/DIFF    tirzepatide  (MOUNJARO ) 10 mg/0.5 mL Subcutaneous Pen Injector    rosuvastatin  (CRESTOR ) 20 mg Oral Tablet    metoprolol  succinate (TOPROL -XL) 25 mg Oral Tablet Sustained Release 24 hr    enalapril  maleate (VASOTEC ) 10 mg Oral Tablet         Return in about 6 months (around 04/05/2025).      Comer Mead, MD    This note was partially created using M*Modal fluency direct system (voice recognition software ) and is inherently subject to errors including those of syntax and "sound- alike substitutions which may escape proofreading.  In such instances, original meaning may be extrapolated by contextual derivation.          [1] No Known Allergies

## 2024-10-06 NOTE — Assessment & Plan Note (Signed)
 Blood pressure well controlled on metoprolol  and enalapril .  Much less lightheadedness happens only when he has significant position change like sitting to standing and it is minimal

## 2024-10-06 NOTE — Assessment & Plan Note (Signed)
 Following with urology and changed from Flomax  to uroxatrol and he is doing better.  Seeing them in January.

## 2024-10-06 NOTE — Assessment & Plan Note (Signed)
 Reviewed all labs with patient from October 02, 2024 his total cholesterol is 90 HDL 45, LDL 28 triglycerides 83 lipids ideal on rosuvastatin 

## 2024-10-06 NOTE — Assessment & Plan Note (Signed)
 As above reviewed labs from October 02, 2024 total cholesterol 90, HDL 45, LDL 28 and triglycerides 83 with an AST of 22 and an ALT of 28 stable on rosuvastatin 

## 2024-10-06 NOTE — Assessment & Plan Note (Signed)
Stable with Rheumatology

## 2024-10-06 NOTE — Assessment & Plan Note (Signed)
 A1c 6.4 on Mounjaro  10 mg a urine for microalbumin negative

## 2024-10-18 DIAGNOSIS — M459 Ankylosing spondylitis of unspecified sites in spine: Secondary | ICD-10-CM | POA: Diagnosis not present

## 2024-10-19 DIAGNOSIS — K388 Other specified diseases of appendix: Secondary | ICD-10-CM | POA: Diagnosis not present

## 2024-10-30 ENCOUNTER — Encounter (HOSPITAL_COMMUNITY): Payer: Self-pay | Admitting: Adult Medicine

## 2024-10-30 DIAGNOSIS — M459 Ankylosing spondylitis of unspecified sites in spine: Secondary | ICD-10-CM | POA: Diagnosis not present

## 2024-10-30 DIAGNOSIS — E1100 Type 2 diabetes mellitus with hyperosmolarity without nonketotic hyperglycemic-hyperosmolar coma (NKHHC): Secondary | ICD-10-CM

## 2024-10-30 MED ORDER — MOUNJARO 10 MG/0.5 ML SUBCUTANEOUS PEN INJECTOR: 10.0000 mg | PEN_INJECTOR | SUBCUTANEOUS | 1 refills | Status: AC

## 2024-10-31 DIAGNOSIS — R9389 Abnormal findings on diagnostic imaging of other specified body structures: Secondary | ICD-10-CM | POA: Diagnosis not present

## 2024-11-02 DIAGNOSIS — N401 Enlarged prostate with lower urinary tract symptoms: Secondary | ICD-10-CM | POA: Diagnosis not present

## 2024-11-02 DIAGNOSIS — N200 Calculus of kidney: Secondary | ICD-10-CM | POA: Diagnosis not present

## 2024-11-02 DIAGNOSIS — R350 Frequency of micturition: Secondary | ICD-10-CM | POA: Diagnosis not present

## 2024-11-02 DIAGNOSIS — R3912 Poor urinary stream: Secondary | ICD-10-CM | POA: Diagnosis not present

## 2024-11-02 DIAGNOSIS — R351 Nocturia: Secondary | ICD-10-CM | POA: Diagnosis not present

## 2024-11-02 DIAGNOSIS — R972 Elevated prostate specific antigen [PSA]: Secondary | ICD-10-CM | POA: Diagnosis not present

## 2024-11-02 DIAGNOSIS — R3121 Asymptomatic microscopic hematuria: Secondary | ICD-10-CM | POA: Diagnosis not present

## 2024-11-07 ENCOUNTER — Other Ambulatory Visit: Payer: Self-pay

## 2024-11-07 NOTE — Telephone Encounter (Signed)
 Prior authorization for adalimumab -adaz submitted electronically on 11/07/2024 through Fax. Waiting for response from payor.    Elspeth Daring, Pharmacy Technician

## 2024-11-08 ENCOUNTER — Encounter (HOSPITAL_BASED_OUTPATIENT_CLINIC_OR_DEPARTMENT_OTHER): Payer: Self-pay

## 2024-11-08 NOTE — Telephone Encounter (Signed)
 Specialty Pharmacy Note    Prior Authorization for medication Adalimumab -Chase [name] has been approved by payor [facility] from 11/07/24 until 11/07/25.  Approval notice has been scanned into Epic and can be found in the Media tab. Per the Insurance's Requirement the prescription will be sent to [facility].     No further action is needed from clinic      If you have any questions, dont hesitate to contact the Specialty Pharmacy     Elspeth Daring, Pharmacy Technician

## 2024-11-13 ENCOUNTER — Ambulatory Visit (HOSPITAL_BASED_OUTPATIENT_CLINIC_OR_DEPARTMENT_OTHER): Payer: Self-pay | Admitting: Surgical

## 2024-11-15 DIAGNOSIS — M459 Ankylosing spondylitis of unspecified sites in spine: Secondary | ICD-10-CM | POA: Diagnosis not present

## 2024-11-17 ENCOUNTER — Ambulatory Visit (HOSPITAL_BASED_OUTPATIENT_CLINIC_OR_DEPARTMENT_OTHER): Payer: Self-pay | Admitting: Surgical

## 2024-11-24 ENCOUNTER — Other Ambulatory Visit: Payer: Self-pay

## 2024-11-24 ENCOUNTER — Ambulatory Visit (HOSPITAL_BASED_OUTPATIENT_CLINIC_OR_DEPARTMENT_OTHER): Payer: Self-pay | Admitting: Surgical

## 2024-11-24 ENCOUNTER — Ambulatory Visit: Attending: Surgical | Admitting: Surgical

## 2024-11-24 VITALS — BP 128/70 | Ht 69.0 in | Wt 184.1 lb

## 2024-11-24 DIAGNOSIS — L4050 Arthropathic psoriasis, unspecified: Secondary | ICD-10-CM | POA: Insufficient documentation

## 2024-11-24 DIAGNOSIS — Z79899 Other long term (current) drug therapy: Secondary | ICD-10-CM | POA: Insufficient documentation

## 2024-11-24 NOTE — Patient Instructions (Addendum)
 Consider when speaking to insurance :  1) maintenance therapy options  2) tier exception to lower tier  3) another preferred biosimilar that is less cost  4) adalimumab  adaz co pay

## 2024-11-24 NOTE — Progress Notes (Signed)
 [facility]  [facility]  [address]  [facility] [place] [address]  [phone]      Subjective:  Chase [name] is a 59 y.o. year old male who presents to clinic for follow up of psoriatic arthritis. He was last seen in clinic on 11/15/2023.At that time, he continued Humira  40 mg subq Q 14 days; however insurance required change to bio similar adalimumab  adaz.     In the interim, patient reports doing well overall. He reports intermittent pain of his ankles, knees and back that is dependent on activity. He denies significant AM stiffness, but does positive gelling for about 10 - 15 minutes. Otherwise, he endorses to be overall stable without any significant flares in the interim. His skin has been doing well without recent rashes, or plaques. He denies regular use of NSAID's, or Tylenol . Denies recurrent infections, cough, or SOB.   He notes that this bio similar adalimumab  does not seem to provide the same relief  as brand humira .     He reports losing ~40 pounds since his last visit. He is currently taking Mounjaro  for DM management, and has benefit.     He follows with his PCP next in May.       ROS:  All other systems reviewed in detail are negative, except as noted.    Relevant Medications: Enbrel     Medications:  Current Outpatient Medications   Medication Sig    adalimumab -adaz 40 mg/0.4 mL Subcutaneous Pen Injector Inject 0.4 mL (40 mg total) under the skin Every 14 days    alfuzosin  (UROXATRAL ) 10 mg Oral Tablet Sustained Release 24 hr Take 1 Tablet (10 mg total) by mouth Daily    Blood Sugar Diagnostic (ONETOUCH VERIO TEST STRIPS) Strip 1 Strip by In Vitro route Three times a day    Blood-Glucose Meter (ONETOUCH VERIO IQ METER) Misc 1 Each by In Vitro route Three times a day    enalapril  maleate (VASOTEC ) 10 mg Oral Tablet Take 1 Tablet (10 mg total) by mouth Daily    lancets (ONETOUCH SURESOFT LANCING DEV) 28 gauge Misc 1 Each by In Vitro route Three  times a day    metoprolol  succinate (TOPROL -XL) 25 mg Oral Tablet Sustained Release 24 hr Take 1 Tablet (25 mg total) by mouth Daily    ONETOUCH DELICA PLUS LANCET 30 gauge Does not apply Misc     rosuvastatin  (CRESTOR ) 20 mg Oral Tablet Take 1 Tablet (20 mg total) by mouth Every evening    tirzepatide  (MOUNJARO ) 10 mg/0.5 mL Subcutaneous Pen Injector Inject 0.5 mL (10 mg total) under the skin Every 7 days     Objective  BP 128/70   Ht 1.753 m (5' 9)   Wt 83.5 kg (184 lb 1.4 oz)   BMI 27.18 kg/m   General: No acute distress. Appears stated age.  Eyes: Conjunctiva clear. No scleral icterus  MSK: No synovitis or tenderness.   Skin: No facial rash, no psoriasis plaques.    Assessment/Plan  Assessment/Plan   1. Psoriatic arthritis (CMS HCC)    2. High risk medication use        Chase [name] is a 59 y.o. male presenting for follow up of psoriatic arthritis. Currently maintained on adalimumab  bio similar Q 14 days over last year, previously started  humira  since 2013. Stable disease activity with no active synovitis on exam.     Plan:  - Continue adalimumab  40 mg subq Q 14 days.  RTC in 1 year for F/U; or sooner, if needed.    No orders of the defined types were placed in this encounter.      Jamee Keach Jo Katora Fini PA-C  [facility]  Rheumatology

## 2024-11-27 ENCOUNTER — Ambulatory Visit (INDEPENDENT_AMBULATORY_CARE_PROVIDER_SITE_OTHER): Payer: Self-pay

## 2024-11-27 ENCOUNTER — Encounter (HOSPITAL_BASED_OUTPATIENT_CLINIC_OR_DEPARTMENT_OTHER): Payer: Self-pay

## 2024-11-27 ENCOUNTER — Ambulatory Visit (HOSPITAL_BASED_OUTPATIENT_CLINIC_OR_DEPARTMENT_OTHER): Payer: Self-pay | Admitting: Surgical

## 2024-11-27 ENCOUNTER — Other Ambulatory Visit: Payer: Self-pay

## 2024-11-27 DIAGNOSIS — Z0000 Encounter for general adult medical examination without abnormal findings: Secondary | ICD-10-CM

## 2024-11-27 NOTE — Telephone Encounter (Signed)
 Regarding: Clinical Question  ----- Message from Tawni FALCON sent at 11/24/2024  4:27 PM EST -----  Copied From CRM [id number]. Weghorst, [place], PA-C    Olvin, Rohr L (Self) called with a clinical question. Pt states that the [employer] is to expensive and they do not offer copay card  Pt asking if you can call something else in. Pt not sure if what you call in needs to go to a Specialty pharmacy or not. Please call pt to advise.      adalimumab -adaz 40 mg/0.4 mL Subcutaneous Pen Injector 2.4 mL 3 12/14/2023 12/13/2024   Sig - Route: Inject 0.4 mL (40 mg total) under the skin Every 14 days - Subcutaneou

## 2024-12-13 ENCOUNTER — Other Ambulatory Visit (HOSPITAL_BASED_OUTPATIENT_CLINIC_OR_DEPARTMENT_OTHER): Payer: Self-pay | Admitting: Surgical

## 2024-12-13 NOTE — Nursing Note (Signed)
 AST (SGOT)    Date Value Ref Range Status   10/02/2024 22 11 - 34 U/L Final     ALT (SGPT)   Date Value Ref Range Status   10/02/2024 28 <43 U/L Final     CREATININE   Date Value Ref Range Status   10/02/2024 0.96 0.75 - 1.35 mg/dL Final       COMPLETE BLOOD COUNT   Lab Results   Component Value Date    WBC 5.5 04/23/2024    HGB 16.3 04/23/2024    HCT 48.4 04/23/2024    PLTCNT 200 04/23/2024       DIFFERENTIAL  Lab Results   Component Value Date    PMNS 47 04/23/2024    LYMPHOCYTES 40 04/23/2024    MONOCYTES 10 04/23/2024    EOSINOPHIL 3 04/23/2024    BASOPHILS 1 04/23/2024    BASOPHILS 0.10 04/23/2024    NRBCS 0 09/09/2011    PMNABS 2.60 04/23/2024    LYMPHSABS 2.20 04/23/2024    EOSABS 0.10 04/23/2024    MONOSABS 0.50 04/23/2024    BASOSABS 0.056 09/28/2014          Last visit: 1.9.2026  Next visit: 12.31.2026    Omelia Cooler, Ambulatory Care Assistant  12/13/2024, 07:14

## 2024-12-15 ENCOUNTER — Encounter (INDEPENDENT_AMBULATORY_CARE_PROVIDER_SITE_OTHER): Payer: Self-pay | Admitting: NURSE PRACTITIONER

## 2024-12-15 ENCOUNTER — Ambulatory Visit: Payer: Self-pay | Attending: NURSE PRACTITIONER | Admitting: NURSE PRACTITIONER

## 2024-12-15 ENCOUNTER — Other Ambulatory Visit: Payer: Self-pay

## 2024-12-15 DIAGNOSIS — Z87442 Personal history of urinary calculi: Secondary | ICD-10-CM | POA: Insufficient documentation

## 2024-12-15 DIAGNOSIS — N400 Enlarged prostate without lower urinary tract symptoms: Secondary | ICD-10-CM | POA: Insufficient documentation

## 2024-12-15 LAB — POCT URINE DIPSTICK
BILIRUBIN: NEGATIVE
BLOOD: NEGATIVE
COLOR: NORMAL
GLUCOSE: NEGATIVE
KETONE: NEGATIVE
LEUKOCYTES: NEGATIVE
NITRITE: NEGATIVE
PH: 6.5
PROTEIN: NEGATIVE
SPECIFIC GRAVITY: 1.015
UROBILINOGEN: 0.2

## 2024-12-15 LAB — POCT PVR: URINE TOTAL VOLUME: 7

## 2024-12-15 MED ORDER — ALFUZOSIN ER 10 MG TABLET,EXTENDED RELEASE 24 HR: 10.0000 mg | ORAL_TABLET | Freq: Every day | ORAL | 3 refills | Status: AC

## 2024-12-15 NOTE — Patient Instructions (Signed)
-   Ensure drinking plenty of water daily, should have 64 oz as goal    - Notify office of any concerns, issues, or changes.      - Complete PSA now and in 1 year. Will call if concerns are noted    - Continue Alfuzosin , notify office with any side effects

## 2024-12-15 NOTE — Progress Notes (Signed)
 UROLOGY, [facility]  [address]  [place] [address]  Operated by [facility]    CHIEF COMPLAINT:   Chief Complaint   Patient presents with    Benign Prostatic Hypertrophy     Yearly follow up. States urinary habits improved with Uroxatrol.       HPI: Chase [name] is a 59 y.o. male who presents in clinic for 12 month follow up.     Patient was last seen in office on December 16, 2023. He was having worsening urinary symptoms as though the tamsulosin  was not as beneficial. Reviewed during the summer, adjusted to Alfuzosin  therapy. After a few weeks, symptoms returned back to baseline and satisfactory. No side effects reported, denies any dysuria, hematuria, of bladder pains.     History:  Patient established in our office on December 16, 2023; having his previous Urologist was from [place] is no longer in practice and needed to have his care transferred.     Indicates a history of kidney stones occurring around 2012, requiring surgical intervention with urethroscopy. Occurring a second time a year later, no other stolen history since.     He has been doing great since the tadalafil  was started years ago.  No reported side effects.  His urinary symptoms are without bother, without therapy he was having urgency, nocturia, and did not feel as through he was emptying well.       KUB:  02/28/2024 No suspicious calcifications     12/12/2021 No definite renal or ureteral stones    Urine:  12/12/2021 0 RBC, 0 WBC     CRE:  10/02/2024 0.96  04/23/2024 1.06  10/11/2023 1.04  11/20/2022 1.19    PSA:  10/11/2023 0.28  12/06/2021 0.29  12/06/2020 0.2  11/25/2018 0.3    MEDS:  Tadalafil  5 mg daily- No reported side effects   Alfuzosin  10 mg - Denies any side effects     History:  Family History of Prostate Cancer Denies   Family History of Bladder Cancer Denies   Family History of Kidney Cancer Denies   Family History of Kidney Stones Denies   Previous smoker of cigars, socially.     Voiding  symptoms    Dysuria  Denies   Gross hematuria Denies   Incontinence   Denies   Pads per day  Denies   Hx of UTI  Denies   Hx of retention  Denies   Nocturia   0     Bowel movements occur daily; without issues, concerns, or changes.     Daily Fluid Intake:    Water  80 oz   Coffee  32 oz   Tea  16 oz  Soft Drinks 0  Juice  0  Milk  12 oz   Energy Drink 0  Alcohol 0      Last BMP  (Last result in the past 2 years)        Na   K   Cl   CO2   BUN   Cr   Calcium   Glucose   Glucose-Fasting        10/02/24 0712 139   4.4   106   23   18   0.96   9.6  Comment: Gadolinium-containing contrast can interfere with calcium measurement.     141               Lab Results   Component Value Date    CREATININE 0.96  10/02/2024     Last CBC  (Last result in the past 2 years)        WBC   HGB   HCT   MCV   Platelets      04/23/24 0919 5.5   16.3   48.4   89.5   200              No results found for: VITD25]    No results found for: IPTH    Recent Results (from the past [id number] hours)   URINALYSIS, MACRO/MICRO    Collection Time: 02/21/24  6:52 PM   Result Value    COLOR Yellow    APPEARANCE Clear    GLUCOSE Negative    LEUKOCYTES Negative    KETONES Negative    BLOOD Negative    PH 6.5    NITRITE Negative    PROTEIN Negative    UROBILINOGEN 0.2    SPECIFIC GRAVITY 1.020    BILIRUBIN Negative   URINALYSIS WITH REFLEX MICROSCOPIC AND CULTURE IF POSITIVE    Collection Time: 02/21/24  6:52 PM    Specimen: Urine, Clean Catch    Narrative    The following orders were created for panel order URINALYSIS WITH REFLEX MICROSCOPIC AND CULTURE IF POSITIVE.  Procedure                               Abnormality         Status                     ---------                               -----------         ------                     URINALYSIS, MACRO/MICRO[[id number]]      Normal              Final result                 Please view results for these tests on the individual orders.       Lab Results   Component Value Date    URINECX No significant  growth 48 hours. 12/12/2021       No results found for: [id number]    $&    No Known Allergies    Outpatient Medications Marked as Taking for the 12/15/24 encounter (Office Visit) with Reggy Rush, APRN, CNP   Medication Sig Dispense Refill    alfuzosin  (UROXATRAL ) 10 mg Oral Tablet Sustained Release 24 hr Take 1 Tablet (10 mg total) by mouth Daily 90 Tablet 3     Past Medical History:   Diagnosis Date    Arthritis with psoriasis (CMS HCC)     Arthropathy     psoriatic    CAD (coronary artery disease)     Diabetes mellitus, type 2 03/19/2009    Fracture of distal end of tibia     left    Fracture of distal fibula     left    Fracture of middle phalanx of left index finger 08/07/2024    [employer] workers compensation    GERD (gastroesophageal reflux disease)     Hyperplastic colon polyp 12/14/2015    Hypertension     Kidney stones  Right shoulder pain     Tubular adenoma of colon 12/14/2015    Type 2 diabetes mellitus     Urinary calculus, unspecified     Renal stones         Past Surgical History:   Procedure Laterality Date    COLONOSCOPY  12/14/2015    Dr Gilberto due 2020/tubular adenoma/ hyperplastic polyp    COLONOSCOPY  10/15/2022    [facility]/ Dr Anda  DUE 2028    COLONOSCOPY N/A 10/23/2016    Performed by Janan Kitchens, MD at [facility] OR ENDO    COLONOSCOPY DIAGNOSTIC N/A 10/15/2022    Performed by Rosalinda Guerry HERO, MD at [facility] OR ENDO    HX APPENDECTOMY      2017    HX CARPAL TUNNEL RELEASE  1990    bil    HX HEART CATHETERIZATION  09/2008    HX HERNIA REPAIR  1970    2010    LITHOTRIPSY  2004    PB LAP UMBILICAL HERNIA REPAIR  2009     Family Medical History:       Problem Relation (Age of Onset)    Cancer Father    Diabetes Mother, Father    Hypertension (High Blood Pressure) Mother            Social History     Socioeconomic History    Marital status: Married     Spouse name: Not on file    Number of children: 3    Years of education: Not on file    Highest education level: Not on file   Occupational History    Not on  file   Tobacco Use    Smoking status: Former     Types: Cigars     Passive exposure: Past    Smokeless tobacco: Former     Types: Snuff     Quit date: 10/26/2019    Tobacco comments:     3-4 a week   Vaping Use    Vaping status: Never Used   Substance and Sexual Activity    Alcohol use: Yes     Comment: occassionally    Drug use: Never    Sexual activity: Not on file   Other Topics Concern    Ability to Walk 1 Flight of Steps without SOB/CP Not Asked    Routine Exercise Not Asked    Ability to Walk 2 Flight of Steps without SOB/CP Not Asked    Unable to Ambulate Not Asked    Total Care Not Asked    Ability To Do Own ADL's Not Asked    Uses Walker Not Asked    Other Activity Level Not Asked    Uses Cane Not Asked   Social History Narrative    Not on file     Social Determinants of Health     Financial Resource Strain: Low Risk (10/06/2024)    Financial Resource Strain     SDOH Financial: No   Transportation Needs: Low Risk (10/06/2024)    Transportation Needs     SDOH Transportation: No   Social Connections: Low Risk (10/06/2024)    Social Connections     SDOH Social Isolation: 5 or more times a week   Intimate Partner Violence: Low Risk (10/06/2024)    Intimate Partner Violence     SDOH Domestic Violence: No   Housing Stability: Low Risk (10/06/2024)    Housing Stability     SDOH Housing Situation: I have housing.  SDOH Housing Worry: No         The Medication, Surgical, Medical, Allergies, Social and Family history stated above was reviewed with the patient during this visit.    REVIEW OF SYSTEMS:  The Review of Systems is per the HPI, All pertinent positives are indicated and All other systems were reviewed and are negative        PHYSICAL EXAM:  General Appearance:  Well developed, well nourished.  In no acute distress.  Fully alert and oriented.  Vital signs: There were no vitals taken for this visit.     Skin:  Soft and dry without rashes.  HEENT:  Normocephalic, atraumatic.  Sclerae white. Extraocular  movements intact. Mucous membranes moist.  Neck: Trachea midline.   Respiratory:   Normal symmetric bilateral excursion and effort.  Cardiovascular:  Upper extremity warm, well perfused, no edema.  Back: No costovertebral angle tenderness.  Abdomen:  Soft, nontender, nondistended.    Psychiatric: Normal mood and affect.  Neurologic: Gait normal  Genitourinary:  Deferred     $!RADIOLOGY/LABS:    Urine Dip  LEUKOCYTES   Date Value Ref Range Status   02/21/2024 Negative Negative WBCs/uL Final     NITRITE   Date Value Ref Range Status   02/21/2024 Negative Negative Final     UROBILINOGEN   Date Value Ref Range Status   02/21/2024 0.2 0.2 mg/dL Final     PROTEIN   Date Value Ref Range Status   02/21/2024 Negative Negative mg/dL Final     PH   Date Value Ref Range Status   12/16/2023 5  Final     BLOOD   Date Value Ref Range Status   12/16/2023 neg  Final     SPECIFIC GRAVITY   Date Value Ref Range Status   12/16/2023 1.025  Final     KETONE   Date Value Ref Range Status   12/16/2023 neg  Final     BILIRUBIN   Date Value Ref Range Status   02/21/2024 Negative Negative mg/dL Final     GLUCOSE   Date Value Ref Range Status   10/02/2024 141 (H) 70 - 99 mg/dL Final       ASSESSMENT AND PLAN: Chase [name] is a 59 y.o. male with BPH without LUTS.     - PVR of 7 mL, no urinary complaints or side effects with alfuzosin  10 mg daily therapy. Continue therapy and monitor.     - Prostate cancer screening was being completed by old PCP, last in November 2024. New PSA ordered for now as well as in 1 year to monitor.     - KUB imaging in 2025 was negative for stone burden, no stone symptoms/issues since 2012, PRN monitoring based on symptoms.     Patient educated if they were to develop intolerable pain, inability to tolerate oral intake, or fever/chills; they are to go to the Emergency Room.    Orders Placed This Encounter    PSA, DIAGNOSTIC    PSA, DIAGNOSTIC    POCT URINE DIPSTICK    POCT PVR    alfuzosin  (UROXATRAL ) 10 mg Oral  Tablet Sustained Release 24 hr     RTC: 1 year, sooner if PSA concerns noted    Norleen Parody FNP-C  Advanced Practice Provider  [facility]  Department of Urology        Portions of this note may be dictated using voice recognition software or a dictation device.  Variances in  spelling and vocabulary are possible and unintentional.  Not all errors are caught and corrected.  Please notify the dino if any discrepancies are noted or if the meaning of any statement is not clear.

## 2024-12-15 NOTE — Nursing Note (Signed)
Pt voids. Bladder scan reveals a PVR of 7 ml.Adaia Matthies, RN

## 2025-04-06 ENCOUNTER — Ambulatory Visit (HOSPITAL_COMMUNITY): Payer: Self-pay | Admitting: Adult Medicine

## 2025-11-15 ENCOUNTER — Ambulatory Visit (HOSPITAL_BASED_OUTPATIENT_CLINIC_OR_DEPARTMENT_OTHER): Payer: Self-pay | Admitting: Surgical

## 2025-12-14 ENCOUNTER — Ambulatory Visit (INDEPENDENT_AMBULATORY_CARE_PROVIDER_SITE_OTHER): Payer: Self-pay
